# Patient Record
Sex: Male | Born: 1935
Health system: Southern US, Community
[De-identification: ages and names within clinical notes are randomized; demographics above are authoritative.]

## PROBLEM LIST (undated history)

## (undated) DIAGNOSIS — I739 Peripheral vascular disease, unspecified: Secondary | ICD-10-CM

## (undated) DIAGNOSIS — I2510 Atherosclerotic heart disease of native coronary artery without angina pectoris: Secondary | ICD-10-CM

## (undated) DIAGNOSIS — N183 Chronic kidney disease, stage 3 unspecified: Secondary | ICD-10-CM

## (undated) DIAGNOSIS — K219 Gastro-esophageal reflux disease without esophagitis: Secondary | ICD-10-CM

## (undated) DIAGNOSIS — J90 Pleural effusion, not elsewhere classified: Secondary | ICD-10-CM

## (undated) DIAGNOSIS — N490 Inflammatory disorders of seminal vesicle: Secondary | ICD-10-CM

## (undated) DIAGNOSIS — E785 Hyperlipidemia, unspecified: Secondary | ICD-10-CM

## (undated) DIAGNOSIS — M7989 Other specified soft tissue disorders: Secondary | ICD-10-CM

## (undated) DIAGNOSIS — I2699 Other pulmonary embolism without acute cor pulmonale: Secondary | ICD-10-CM

## (undated) DIAGNOSIS — Z7901 Long term (current) use of anticoagulants: Secondary | ICD-10-CM

## (undated) DIAGNOSIS — J449 Chronic obstructive pulmonary disease, unspecified: Secondary | ICD-10-CM

## (undated) DIAGNOSIS — I10 Essential (primary) hypertension: Secondary | ICD-10-CM

## (undated) DIAGNOSIS — R001 Bradycardia, unspecified: Secondary | ICD-10-CM

## (undated) HISTORY — DX: Bradycardia, unspecified: R00.1

## (undated) HISTORY — DX: Other pulmonary embolism without acute cor pulmonale: I26.99

## (undated) HISTORY — DX: Pleural effusion, not elsewhere classified: J90

## (undated) HISTORY — DX: Inflammatory disorders of seminal vesicle: N49.0

## (undated) HISTORY — DX: Gastro-esophageal reflux disease without esophagitis: K21.9

## (undated) HISTORY — DX: Peripheral vascular disease, unspecified: I73.9

## (undated) HISTORY — DX: Atherosclerotic heart disease of native coronary artery without angina pectoris: I25.10

## (undated) HISTORY — DX: Chronic kidney disease, stage 3 unspecified: N18.30

## (undated) HISTORY — DX: Chronic obstructive pulmonary disease, unspecified: J44.9

## (undated) HISTORY — DX: Hyperlipidemia, unspecified: E78.5

## (undated) HISTORY — DX: Chronic kidney disease, stage 3 (moderate): N18.3

## (undated) HISTORY — DX: Long term (current) use of anticoagulants: Z79.01

---

## 2001-05-13 ENCOUNTER — Inpatient Hospital Stay (HOSPITAL_COMMUNITY): Admission: RE | Admit: 2001-05-13 | Discharge: 2001-05-14 | Payer: Self-pay | Admitting: Cardiology

## 2001-06-01 ENCOUNTER — Encounter (HOSPITAL_COMMUNITY): Admission: RE | Admit: 2001-06-01 | Discharge: 2001-07-01 | Payer: Self-pay | Admitting: Cardiology

## 2001-07-03 ENCOUNTER — Encounter (HOSPITAL_COMMUNITY): Admission: RE | Admit: 2001-07-03 | Discharge: 2001-08-02 | Payer: Self-pay | Admitting: Cardiology

## 2001-08-05 ENCOUNTER — Encounter (HOSPITAL_COMMUNITY): Admission: RE | Admit: 2001-08-05 | Discharge: 2001-09-04 | Payer: Self-pay | Admitting: Cardiology

## 2001-08-14 ENCOUNTER — Inpatient Hospital Stay (HOSPITAL_COMMUNITY): Admission: AD | Admit: 2001-08-14 | Discharge: 2001-08-18 | Payer: Self-pay | Admitting: Cardiology

## 2001-09-14 ENCOUNTER — Encounter (HOSPITAL_COMMUNITY): Admission: RE | Admit: 2001-09-14 | Discharge: 2001-10-14 | Payer: Self-pay | Admitting: Cardiology

## 2001-09-25 ENCOUNTER — Encounter: Payer: Self-pay | Admitting: Cardiology

## 2001-09-25 ENCOUNTER — Ambulatory Visit (HOSPITAL_COMMUNITY): Admission: RE | Admit: 2001-09-25 | Discharge: 2001-09-25 | Payer: Self-pay | Admitting: Cardiology

## 2001-10-04 HISTORY — PX: CORONARY ARTERY BYPASS GRAFT: SHX141

## 2001-10-15 ENCOUNTER — Encounter: Payer: Self-pay | Admitting: Surgery

## 2001-10-15 ENCOUNTER — Inpatient Hospital Stay (HOSPITAL_COMMUNITY): Admission: RE | Admit: 2001-10-15 | Discharge: 2001-10-20 | Payer: Self-pay | Admitting: Cardiology

## 2001-10-16 ENCOUNTER — Encounter: Payer: Self-pay | Admitting: Surgery

## 2001-10-17 ENCOUNTER — Encounter: Payer: Self-pay | Admitting: Surgery

## 2001-10-17 ENCOUNTER — Encounter: Payer: Self-pay | Admitting: Cardiothoracic Surgery

## 2001-10-18 ENCOUNTER — Encounter: Payer: Self-pay | Admitting: Cardiothoracic Surgery

## 2001-10-19 ENCOUNTER — Encounter: Payer: Self-pay | Admitting: Cardiothoracic Surgery

## 2001-10-20 ENCOUNTER — Encounter: Payer: Self-pay | Admitting: Cardiothoracic Surgery

## 2001-11-06 ENCOUNTER — Ambulatory Visit (HOSPITAL_COMMUNITY): Admission: RE | Admit: 2001-11-06 | Discharge: 2001-11-06 | Payer: Self-pay | Admitting: Cardiology

## 2001-11-25 ENCOUNTER — Encounter (HOSPITAL_COMMUNITY): Admission: RE | Admit: 2001-11-25 | Discharge: 2001-12-28 | Payer: Self-pay | Admitting: Cardiology

## 2001-12-30 ENCOUNTER — Encounter (HOSPITAL_COMMUNITY): Admission: RE | Admit: 2001-12-30 | Discharge: 2002-01-29 | Payer: Self-pay | Admitting: Cardiology

## 2002-02-01 ENCOUNTER — Encounter (HOSPITAL_COMMUNITY): Admission: RE | Admit: 2002-02-01 | Discharge: 2002-03-03 | Payer: Self-pay | Admitting: Cardiology

## 2002-03-04 ENCOUNTER — Encounter (HOSPITAL_COMMUNITY): Admission: RE | Admit: 2002-03-04 | Discharge: 2002-04-03 | Payer: Self-pay | Admitting: Cardiology

## 2002-03-08 ENCOUNTER — Encounter (HOSPITAL_COMMUNITY): Admission: RE | Admit: 2002-03-08 | Discharge: 2002-04-07 | Payer: Self-pay | Admitting: Cardiology

## 2004-12-24 ENCOUNTER — Ambulatory Visit (HOSPITAL_COMMUNITY): Admission: RE | Admit: 2004-12-24 | Discharge: 2004-12-24 | Payer: Self-pay | Admitting: Urology

## 2005-02-06 ENCOUNTER — Ambulatory Visit: Payer: Self-pay | Admitting: Cardiology

## 2005-06-06 ENCOUNTER — Ambulatory Visit (HOSPITAL_COMMUNITY): Admission: RE | Admit: 2005-06-06 | Discharge: 2005-06-06 | Payer: Self-pay | Admitting: Urology

## 2005-07-05 HISTORY — PX: TRANSURETHRAL RESECTION OF PROSTATE: SHX73

## 2005-07-24 ENCOUNTER — Ambulatory Visit (HOSPITAL_COMMUNITY): Admission: RE | Admit: 2005-07-24 | Discharge: 2005-07-24 | Payer: Self-pay | Admitting: Urology

## 2005-08-19 ENCOUNTER — Ambulatory Visit (HOSPITAL_COMMUNITY): Admission: RE | Admit: 2005-08-19 | Discharge: 2005-08-19 | Payer: Self-pay | Admitting: Urology

## 2005-10-22 ENCOUNTER — Ambulatory Visit: Payer: Self-pay | Admitting: Cardiology

## 2005-11-04 DIAGNOSIS — I2699 Other pulmonary embolism without acute cor pulmonale: Secondary | ICD-10-CM

## 2005-11-04 HISTORY — DX: Other pulmonary embolism without acute cor pulmonale: I26.99

## 2005-12-05 ENCOUNTER — Ambulatory Visit: Payer: Self-pay | Admitting: Cardiology

## 2006-02-17 ENCOUNTER — Ambulatory Visit (HOSPITAL_COMMUNITY): Admission: RE | Admit: 2006-02-17 | Discharge: 2006-02-17 | Payer: Self-pay | Admitting: Cardiology

## 2006-02-17 ENCOUNTER — Ambulatory Visit: Payer: Self-pay | Admitting: Cardiology

## 2006-02-18 ENCOUNTER — Ambulatory Visit (HOSPITAL_COMMUNITY): Admission: RE | Admit: 2006-02-18 | Discharge: 2006-02-18 | Payer: Self-pay | Admitting: Cardiology

## 2006-02-18 ENCOUNTER — Ambulatory Visit: Payer: Self-pay | Admitting: *Deleted

## 2006-02-21 ENCOUNTER — Ambulatory Visit: Payer: Self-pay | Admitting: Cardiology

## 2006-02-24 ENCOUNTER — Encounter (HOSPITAL_COMMUNITY): Admission: RE | Admit: 2006-02-24 | Discharge: 2006-03-26 | Payer: Self-pay | Admitting: Cardiology

## 2006-02-24 ENCOUNTER — Ambulatory Visit: Payer: Self-pay | Admitting: Cardiology

## 2006-02-24 ENCOUNTER — Inpatient Hospital Stay (HOSPITAL_COMMUNITY): Admission: AD | Admit: 2006-02-24 | Discharge: 2006-03-02 | Payer: Self-pay | Admitting: Cardiology

## 2006-02-25 ENCOUNTER — Encounter (INDEPENDENT_AMBULATORY_CARE_PROVIDER_SITE_OTHER): Payer: Self-pay | Admitting: *Deleted

## 2006-03-04 ENCOUNTER — Ambulatory Visit: Payer: Self-pay | Admitting: *Deleted

## 2006-03-07 ENCOUNTER — Ambulatory Visit: Payer: Self-pay | Admitting: *Deleted

## 2006-03-13 ENCOUNTER — Ambulatory Visit: Payer: Self-pay | Admitting: *Deleted

## 2006-03-18 ENCOUNTER — Ambulatory Visit: Payer: Self-pay | Admitting: *Deleted

## 2006-04-02 ENCOUNTER — Ambulatory Visit: Payer: Self-pay | Admitting: *Deleted

## 2006-05-05 ENCOUNTER — Ambulatory Visit: Payer: Self-pay | Admitting: *Deleted

## 2006-05-19 ENCOUNTER — Ambulatory Visit: Payer: Self-pay | Admitting: Cardiology

## 2006-05-26 ENCOUNTER — Ambulatory Visit: Payer: Self-pay | Admitting: Cardiology

## 2006-06-10 ENCOUNTER — Ambulatory Visit: Payer: Self-pay | Admitting: *Deleted

## 2006-07-16 ENCOUNTER — Ambulatory Visit: Payer: Self-pay | Admitting: Cardiology

## 2006-08-13 ENCOUNTER — Ambulatory Visit: Payer: Self-pay | Admitting: Cardiology

## 2006-09-12 ENCOUNTER — Ambulatory Visit: Payer: Self-pay | Admitting: Internal Medicine

## 2006-10-16 ENCOUNTER — Ambulatory Visit: Payer: Self-pay | Admitting: Internal Medicine

## 2006-11-06 ENCOUNTER — Ambulatory Visit: Payer: Self-pay | Admitting: Cardiology

## 2006-11-27 ENCOUNTER — Ambulatory Visit: Payer: Self-pay | Admitting: Cardiology

## 2006-12-26 ENCOUNTER — Ambulatory Visit: Payer: Self-pay | Admitting: Cardiology

## 2007-01-27 ENCOUNTER — Ambulatory Visit: Payer: Self-pay | Admitting: *Deleted

## 2007-02-09 ENCOUNTER — Ambulatory Visit: Payer: Self-pay | Admitting: Internal Medicine

## 2007-02-23 ENCOUNTER — Ambulatory Visit: Payer: Self-pay | Admitting: Cardiology

## 2007-02-24 ENCOUNTER — Encounter (HOSPITAL_COMMUNITY): Admission: RE | Admit: 2007-02-24 | Discharge: 2007-03-26 | Payer: Self-pay | Admitting: Orthopaedic Surgery

## 2007-03-25 ENCOUNTER — Ambulatory Visit: Payer: Self-pay | Admitting: Cardiology

## 2007-03-27 ENCOUNTER — Ambulatory Visit: Payer: Self-pay | Admitting: Internal Medicine

## 2007-03-27 ENCOUNTER — Ambulatory Visit (HOSPITAL_COMMUNITY): Admission: RE | Admit: 2007-03-27 | Discharge: 2007-03-27 | Payer: Self-pay | Admitting: Cardiology

## 2007-04-28 ENCOUNTER — Ambulatory Visit: Payer: Self-pay | Admitting: Cardiology

## 2007-05-29 ENCOUNTER — Ambulatory Visit: Payer: Self-pay | Admitting: Cardiology

## 2007-06-05 ENCOUNTER — Ambulatory Visit: Payer: Self-pay | Admitting: Cardiology

## 2007-07-07 ENCOUNTER — Ambulatory Visit: Payer: Self-pay | Admitting: Cardiovascular Disease

## 2007-08-07 ENCOUNTER — Ambulatory Visit: Payer: Self-pay | Admitting: Cardiology

## 2007-09-09 ENCOUNTER — Ambulatory Visit: Payer: Self-pay | Admitting: Cardiology

## 2007-10-07 ENCOUNTER — Ambulatory Visit: Payer: Self-pay | Admitting: Cardiology

## 2007-11-11 ENCOUNTER — Ambulatory Visit: Payer: Self-pay | Admitting: Cardiology

## 2007-11-25 ENCOUNTER — Ambulatory Visit: Payer: Self-pay | Admitting: Cardiology

## 2007-12-23 ENCOUNTER — Ambulatory Visit: Payer: Self-pay | Admitting: Cardiology

## 2008-01-06 ENCOUNTER — Ambulatory Visit: Payer: Self-pay | Admitting: Cardiology

## 2008-01-29 ENCOUNTER — Ambulatory Visit: Payer: Self-pay | Admitting: Cardiology

## 2008-02-26 ENCOUNTER — Ambulatory Visit: Payer: Self-pay | Admitting: Internal Medicine

## 2008-03-25 ENCOUNTER — Ambulatory Visit: Payer: Self-pay | Admitting: Cardiovascular Disease

## 2008-04-22 ENCOUNTER — Ambulatory Visit: Payer: Self-pay | Admitting: Internal Medicine

## 2008-05-20 ENCOUNTER — Ambulatory Visit: Payer: Self-pay | Admitting: Internal Medicine

## 2008-06-16 ENCOUNTER — Ambulatory Visit: Payer: Self-pay | Admitting: Cardiology

## 2008-06-21 ENCOUNTER — Ambulatory Visit (HOSPITAL_COMMUNITY): Admission: RE | Admit: 2008-06-21 | Discharge: 2008-06-21 | Payer: Self-pay | Admitting: Cardiology

## 2008-06-24 ENCOUNTER — Ambulatory Visit: Payer: Self-pay | Admitting: Cardiology

## 2008-07-07 ENCOUNTER — Ambulatory Visit: Payer: Self-pay | Admitting: Cardiology

## 2008-07-14 ENCOUNTER — Ambulatory Visit: Payer: Self-pay | Admitting: Cardiology

## 2008-08-08 ENCOUNTER — Ambulatory Visit (HOSPITAL_COMMUNITY): Admission: RE | Admit: 2008-08-08 | Discharge: 2008-08-08 | Payer: Self-pay | Admitting: Urology

## 2008-08-15 ENCOUNTER — Ambulatory Visit: Payer: Self-pay | Admitting: Cardiology

## 2008-09-22 ENCOUNTER — Ambulatory Visit: Payer: Self-pay | Admitting: Cardiology

## 2008-10-13 ENCOUNTER — Ambulatory Visit: Payer: Self-pay | Admitting: Cardiology

## 2008-11-10 ENCOUNTER — Ambulatory Visit: Payer: Self-pay | Admitting: Cardiology

## 2008-12-08 ENCOUNTER — Ambulatory Visit: Payer: Self-pay | Admitting: Cardiology

## 2009-01-02 ENCOUNTER — Ambulatory Visit: Payer: Self-pay | Admitting: Cardiology

## 2009-02-02 ENCOUNTER — Ambulatory Visit: Payer: Self-pay | Admitting: Cardiology

## 2009-02-09 ENCOUNTER — Ambulatory Visit: Payer: Self-pay | Admitting: Cardiology

## 2009-02-20 ENCOUNTER — Ambulatory Visit: Payer: Self-pay | Admitting: Cardiology

## 2009-03-08 ENCOUNTER — Encounter (INDEPENDENT_AMBULATORY_CARE_PROVIDER_SITE_OTHER): Payer: Self-pay | Admitting: *Deleted

## 2009-03-21 ENCOUNTER — Ambulatory Visit: Payer: Self-pay | Admitting: Cardiology

## 2009-04-13 ENCOUNTER — Ambulatory Visit: Payer: Self-pay | Admitting: Cardiology

## 2009-05-04 ENCOUNTER — Ambulatory Visit: Payer: Self-pay | Admitting: Cardiology

## 2009-06-01 ENCOUNTER — Ambulatory Visit: Payer: Self-pay | Admitting: Cardiology

## 2009-06-19 ENCOUNTER — Encounter: Payer: Self-pay | Admitting: *Deleted

## 2009-07-06 ENCOUNTER — Ambulatory Visit: Payer: Self-pay | Admitting: Cardiology

## 2009-07-06 LAB — CONVERTED CEMR LAB: POC INR: 2.9

## 2009-08-09 ENCOUNTER — Ambulatory Visit: Payer: Self-pay | Admitting: Cardiology

## 2009-09-08 ENCOUNTER — Ambulatory Visit: Payer: Self-pay | Admitting: Cardiology

## 2009-09-08 LAB — CONVERTED CEMR LAB: POC INR: 2.4

## 2009-10-09 ENCOUNTER — Ambulatory Visit: Payer: Self-pay | Admitting: Cardiovascular Disease

## 2009-11-06 ENCOUNTER — Ambulatory Visit: Payer: Self-pay | Admitting: Cardiology

## 2009-11-27 ENCOUNTER — Ambulatory Visit: Payer: Self-pay | Admitting: Cardiology

## 2009-12-06 ENCOUNTER — Encounter (HOSPITAL_COMMUNITY): Admission: RE | Admit: 2009-12-06 | Discharge: 2010-01-05 | Payer: Self-pay | Admitting: Orthopaedic Surgery

## 2009-12-25 ENCOUNTER — Ambulatory Visit: Payer: Self-pay | Admitting: Cardiology

## 2009-12-25 LAB — CONVERTED CEMR LAB: POC INR: 1.8

## 2010-01-08 ENCOUNTER — Ambulatory Visit: Payer: Self-pay | Admitting: Cardiology

## 2010-01-08 LAB — CONVERTED CEMR LAB: POC INR: 2

## 2010-02-05 ENCOUNTER — Ambulatory Visit: Payer: Self-pay | Admitting: Cardiology

## 2010-03-05 ENCOUNTER — Ambulatory Visit: Payer: Self-pay | Admitting: Cardiology

## 2010-03-05 LAB — CONVERTED CEMR LAB: POC INR: 3.7

## 2010-03-08 ENCOUNTER — Ambulatory Visit: Payer: Self-pay | Admitting: Cardiology

## 2010-03-08 ENCOUNTER — Encounter: Payer: Self-pay | Admitting: *Deleted

## 2010-03-08 DIAGNOSIS — N490 Inflammatory disorders of seminal vesicle: Secondary | ICD-10-CM | POA: Insufficient documentation

## 2010-03-08 DIAGNOSIS — J90 Pleural effusion, not elsewhere classified: Secondary | ICD-10-CM | POA: Insufficient documentation

## 2010-03-08 DIAGNOSIS — I25810 Atherosclerosis of coronary artery bypass graft(s) without angina pectoris: Secondary | ICD-10-CM | POA: Insufficient documentation

## 2010-03-08 DIAGNOSIS — I495 Sick sinus syndrome: Secondary | ICD-10-CM | POA: Insufficient documentation

## 2010-03-09 ENCOUNTER — Encounter: Payer: Self-pay | Admitting: Adult Health

## 2010-03-28 ENCOUNTER — Ambulatory Visit: Payer: Self-pay | Admitting: Cardiology

## 2010-03-28 LAB — CONVERTED CEMR LAB: POC INR: 2.8

## 2010-04-09 ENCOUNTER — Ambulatory Visit: Payer: Self-pay | Admitting: Cardiovascular Disease

## 2010-04-30 ENCOUNTER — Ambulatory Visit: Payer: Self-pay | Admitting: Cardiology

## 2010-04-30 LAB — CONVERTED CEMR LAB: POC INR: 2.8

## 2010-05-03 ENCOUNTER — Ambulatory Visit: Payer: Self-pay

## 2010-05-03 ENCOUNTER — Ambulatory Visit: Payer: Self-pay | Admitting: Cardiovascular Disease

## 2010-05-28 ENCOUNTER — Ambulatory Visit: Payer: Self-pay | Admitting: Cardiology

## 2010-05-28 LAB — CONVERTED CEMR LAB: POC INR: 2.8

## 2010-05-29 ENCOUNTER — Telehealth: Payer: Self-pay | Admitting: Cardiovascular Disease

## 2010-06-25 ENCOUNTER — Ambulatory Visit: Payer: Self-pay | Admitting: Cardiology

## 2010-06-25 LAB — CONVERTED CEMR LAB: POC INR: 3.1

## 2010-07-23 ENCOUNTER — Ambulatory Visit: Payer: Self-pay | Admitting: Cardiology

## 2010-08-20 ENCOUNTER — Ambulatory Visit: Payer: Self-pay | Admitting: Cardiology

## 2010-09-17 ENCOUNTER — Ambulatory Visit: Payer: Self-pay | Admitting: Cardiology

## 2010-09-17 LAB — CONVERTED CEMR LAB: POC INR: 2.3

## 2010-10-15 ENCOUNTER — Ambulatory Visit: Payer: Self-pay | Admitting: Cardiology

## 2010-11-14 ENCOUNTER — Ambulatory Visit: Admission: RE | Admit: 2010-11-14 | Discharge: 2010-11-14 | Payer: Self-pay | Source: Home / Self Care

## 2010-12-04 NOTE — Medication Information (Signed)
Summary: ccr-lr  Anticoagulant Therapy  Managed by: Vashti Hey, RN Supervising MD: Dietrich Pates MD, Molly Maduro Indication 1: Pulmonary Embolism and Infarction (ICD-415.1) Lab Used: [facility] [place] Site: [place] INR POC 2.2  Dietary changes: no    Health status changes: no    Bleeding/hemorrhagic complications: no    Recent/future hospitalizations: no    Any changes in medication regimen? no    Recent/future dental: no  Any missed doses?: no       Is patient compliant with meds? yes       Allergies: No Known Drug Allergies  Anticoagulation Management History:      The patient is taking warfarin and comes in today for a routine follow up visit.  Positive risk factors for bleeding include an age of 75 years or older.  The bleeding index is 'intermediate risk'.  Positive CHADS2 values include Age > 75 years old.  The start date was 02/21/2006.  Anticoagulation responsible provider: Dietrich Pates MD, Molly Maduro.  INR POC: 2.2.  Cuvette Lot#: [id number].  Exp: 10/11.    Anticoagulation Management Assessment/Plan:      The patient's current anticoagulation dose is Warfarin sodium 2 mg tabs: Use as directed by Anticoagualtion Clinic, Coumadin 2 mg tabs: Sunday - 2 tabs, Monday - 3 tabs, Tuesday - 2 tabs, Wednesday - 2 tabs, Thursday - 2 tabs, Friday - 2 tabs, Saturday - 2 tabs.  The target INR is 2 - 3.  The next INR is due 12/25/2009.  Anticoagulation instructions were given to patient.  Results were reviewed/authorized by Vashti Hey, RN.  He was notified by Vashti Hey RN.         Prior Anticoagulation Instructions: INR 1.7 Take coumadin extra 1 1/2 tablets today then resume 2 tablets once daily except 3 tablets on Mondays  Current Anticoagulation Instructions: INR 2.2 Continue coumadin 4mg  once daily except 6mg  on Mondays

## 2010-12-04 NOTE — Assessment & Plan Note (Signed)
Summary: per check out/pt is havei aterial also/saf    Visit Type:  3 wk f/u Primary Provider:  Lucianne Lei  CC:  edema/ankles....denies any cp or sob.  History of Present Illness: 77 yo AAM with history of CAD s/p CABG, HTN, HL and prior PVD here today for PV follow up. He was seen as a new patient three weeks ago. He told me that both of his legs have been swollen. He feels like there is a boot wrapped around both lower legs. No pain with walking. The pressure sensation in the legs happens at rest and with exertion. No ulcerations. He has a prior workup with non-invasive studies in 2009 with reduced ABI bilaterally per records and diffuse disease in both legs. He has had issues with swelling in both legs since his vein harvest for CABG. No chest pain or SOB. I ordered non-invasive studies which showed normal ABI bilaterally with tibial and small vessel disease bilaterally. Probable medial calcification in both legs. The waveforms in his bilateral CFA and popliteal arteries are normal.   He has had no change in his clinical status. No claudication. No rest pain. No ulcerations over lower ext. Chronic edema bilateral ankles.    Current Medications (verified): 1)  Warfarin Sodium 2 Mg Tabs (Warfarin Sodium) .... Use As Directed By Anticoagualtion Clinic 2)  Coumadin 2 Mg Tabs (Warfarin Sodium) .... Sunday - 2 Tabs, Monday - 3 Tabs, Tuesday - 2 Tabs, Wednesday - 3 Tabs, Thursday - 2 Tabs, Friday - 3 Tabs, Saturday - 2 Tabs 3)  Atacand 16 Mg Tabs (Candesartan Cilexetil) .... Take 1 Tab Daily 4)  Simvastatin 40 Mg Tabs (Simvastatin) .... Take 1 Tab Daily 5)  Atenolol 25 Mg Tabs (Atenolol) .... Take 1 Tab Daily 6)  Flomax 0.4 Mg Caps (Tamsulosin Hcl) .... Take 1 Tab At Night 7)  Allegra 180 Mg Tabs (Fexofenadine Hcl) .... Take 1 Tab Daily 8)  Nitrolingual 0.4 Mg/spray Soln (Nitroglycerin) .... Use As Needed For Chestpain 9)  Aspirin 81 Mg Tbec (Aspirin) .... Take One Daily 10)  Tramadol Hcl 50 Mg  Tabs (Tramadol Hcl) .... Take One Every 4 To 6 Hours As Needed 11)  Tamsulosin Hcl 0.4 Mg Caps (Tamsulosin Hcl) .... Take One At Bedtime  Allergies: 1)  ! Erythromycin  Review of Systems       The patient complains of leg swelling.  The patient denies fatigue, malaise, fever, weight gain/loss, vision loss, decreased hearing, hoarseness, chest pain, palpitations, shortness of breath, prolonged cough, wheezing, sleep apnea, coughing up blood, abdominal pain, blood in stool, nausea, vomiting, diarrhea, heartburn, incontinence, blood in urine, muscle weakness, joint pain, rash, skin lesions, headache, fainting, dizziness, depression, anxiety, enlarged lymph nodes, easy bruising or bleeding, and environmental allergies.    Vital Signs:  Patient profile:   77 year old male Height:      71 inches Weight:      194 pounds BMI:     27 .16 Pulse rate:   78 / minute Pulse rhythm:   regular BP sitting:   138 / 80  (left arm) Cuff size:   large  Vitals Entered By: Danielle Rankin, CMA (May 03, 2010 12:16 PM)  Physical Exam  General:  General: Well developed, well nourished, NAD Neuro: No focal deficits Musculoskeletal: Muscle strength 5/5 all ext Neck: No JVD, no carotid bruits, no thyromegaly, no lymphadenopathy. Lungs:Clear bilaterally, no wheezes, rhonci, crackles CV: RRR no murmurs, gallops rubs Abdomen: soft, NT, ND, BS present Extremities: Trace bilateral  edema, pulses difficult to palpate DP/PT   Arterial Doppler  Procedure date:  05/03/2010  Findings:      Right ABI 1.1, Left ABI 0.96. (Probable medial calcification) Normal CFA and popliteal waveforms Tibial and small vessel disease bilaterally TBI 0.45 right and 0.54 left. Adequate for tissue healing  Impression & Recommendations:  Problem # 1:  PVD (ICD-443.9) No claudication or rest pain. Probable disease but not severe. Repeat ABI in one year. I will see him at that time. Continue ASA.   Patient Instructions: 1)  Your  physician recommends that you schedule a follow-up appointment in: 12 months 2)  Your physician has requested that you have an ankle brachial index (ABI). During this test an ultrasound and blood pressure cuff are used to evaluate the arteries that supply the arms and legs with blood. Allow thirty minutes for this exam. There are no restrictions or special instructions. To be done in 12 months

## 2010-12-04 NOTE — Medication Information (Signed)
Summary: ccr-lr  Anticoagulant Therapy  Managed by: Vashti Hey, RN PCP: Marguerita Merles MD: Dietrich Pates MD, Molly Maduro Indication 1: Pulmonary Embolism and Infarction (ICD-415.1) Lab Used: [facility] [place] Site: [place] INR POC 2.8  Dietary changes: no    Health status changes: no    Bleeding/hemorrhagic complications: no    Recent/future hospitalizations: no    Any changes in medication regimen? no    Recent/future dental: no  Any missed doses?: no       Is patient compliant with meds? yes       Allergies: 1)  ! Erythromycin  Anticoagulation Management History:      The patient is taking warfarin and comes in today for a routine follow up visit.  Positive risk factors for bleeding include an age of 75 years or older.  The bleeding index is 'intermediate risk'.  Positive CHADS2 values include History of HTN and Age > 21 years old.  The start date was 02/21/2006.  Anticoagulation responsible provider: Dietrich Pates MD, Molly Maduro.  INR POC: 2.8.  Cuvette Lot#: [id number].  Exp: 10/11.    Anticoagulation Management Assessment/Plan:      The patient's current anticoagulation dose is Warfarin sodium 2 mg tabs: Use as directed by Anticoagualtion Clinic, Coumadin 2 mg tabs: Sunday - 2 tabs, Monday - 3 tabs, Tuesday - 2 tabs, Wednesday - 3 tabs, Thursday - 2 tabs, Friday - 3 tabs, Saturday - 2 tabs.  The target INR is 2 - 3.  The next INR is due 05/28/2010.  Anticoagulation instructions were given to patient.  Results were reviewed/authorized by Vashti Hey, RN.  He was notified by Vashti Hey RN.         Prior Anticoagulation Instructions: INR 2.8 Continue coumadin 4mg  once daily except 6mg  on Mondays, Wednesdays and Fridays  Current Anticoagulation Instructions: Same as Prior Instructions.

## 2010-12-04 NOTE — Assessment & Plan Note (Signed)
Summary: np6/pad/jss  Medications Added ASPIRIN 81 MG TBEC (ASPIRIN) take one daily TRAMADOL HCL 50 MG TABS (TRAMADOL HCL) take one every 4 to 6 hours as needed TAMSULOSIN HCL 0.4 MG CAPS (TAMSULOSIN HCL) take one at bedtime      Allergies Added: ! ERYTHROMYCIN  Visit Type:  Initial Consult Primary Provider:  mckay,james  CC:  edema in both ankles.  History of Present Illness: 77 yo AAM with history of CAD s/p CABG, HTN, HL and prior PVD referred today for new patient evaluation for PV assessment. He tells me that both of his legs have been swollen. He feels like there is a boot wrapped around both lower legs. No pain with walking. The pressure sensation in the legs happens at rest and with exertion. No ulcerations. He has a prior workup with non-invasive studies in 2009 with reduced ABI bilaterally per records and diffuse disease in both legs. He has had issues with swelling in both legs since his vein harvest for CABG. No chest pain or SOB.   Current Medications (verified): 1)  Warfarin Sodium 2 Mg Tabs (Warfarin Sodium) .... Use As Directed By Anticoagualtion Clinic 2)  Coumadin 2 Mg Tabs (Warfarin Sodium) .... Sunday - 2 Tabs, Monday - 3 Tabs, Tuesday - 2 Tabs, Wednesday - 3 Tabs, Thursday - 2 Tabs, Friday - 3 Tabs, Saturday - 2 Tabs 3)  Atacand 16 Mg Tabs (Candesartan Cilexetil) .... Take 1 Tab Daily 4)  Simvastatin 40 Mg Tabs (Simvastatin) .... Take 1 Tab Daily 5)  Atenolol 25 Mg Tabs (Atenolol) .... Take 1 Tab Daily 6)  Flomax 0.4 Mg Caps (Tamsulosin Hcl) .... Take 1 Tab At Night 7)  Allegra 180 Mg Tabs (Fexofenadine Hcl) .... Take 1 Tab Daily 8)  Nitrolingual 0.4 Mg/spray Soln (Nitroglycerin) .... Use As Needed For Chestpain 9)  Aspirin 81 Mg Tbec (Aspirin) .... Take One Daily 10)  Tramadol Hcl 50 Mg Tabs (Tramadol Hcl) .... Take One Every 4 To 6 Hours As Needed 11)  Tamsulosin Hcl 0.4 Mg Caps (Tamsulosin Hcl) .... Take One At Bedtime  Allergies (verified): 1)  !  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 coronary artery bypass graft 10/2001 turp 07/2005  Family History: Father:deceased age 54 due to natural causes Mother:deceased age 18 due to natural causes Siblings:1 brother deceased 1 sister alive and well  Social History: Retired - [employer] Married 2 children, 2 grandchildren Tobacco Use - Former, none since 1989. (10 pack year history) Alcohol Use -Yes, 1 can beer/day.  Regular Exercise - no Drug Use - no  Review of Systems       The patient complains of joint pain and leg swelling.  The patient denies fatigue, malaise, fever, weight gain/loss, vision loss, decreased hearing, hoarseness, chest pain, palpitations, shortness of breath, prolonged cough, wheezing, sleep apnea, coughing up blood, abdominal pain, blood in stool, nausea, vomiting, diarrhea, heartburn, incontinence, blood in urine, muscle weakness, rash, skin lesions, headache, fainting, dizziness, depression, anxiety, enlarged lymph nodes, easy bruising or bleeding, and environmental allergies.         See HPI, Pressure feeling in both legs.  Vital Signs:  Patient profile:   77 year old male Height:      71 inches Weight:      196 pounds Pulse rate:   84 / minute Pulse rhythm:   regular BP sitting:   168 / 72  (left arm)  Vitals Entered By: Jacquelin Hawking, CMA (April 09, 2010 11:39 AM)  Physical Exam  General:  General: Well  developed, well nourished, NAD HEENT: OP clear, mucus membranes moist SKIN: warm, dry Neuro: No focal deficits Musculoskeletal: Muscle strength 5/5 all ext Psychiatric: Mood and affect normal Neck: No JVD, no carotid bruits, no thyromegaly, no lymphadenopathy. Lungs:Clear bilaterally, no wheezes, rhonci, crackles CV: RRR no murmurs, gallops rubs Abdomen: soft, NT, ND, BS present Extremities: Trace bilateral  edema, pulses difficult to palpate DP/PT-hand held doppler with dopplerable right DP/PT, left DP. Could not doppler left PT.     Impression & Recommendations:  Problem # 1:  PERIPHERAL CIRCULATORY DISORDER (ICD-V12.59) His symptoms could be related to arterial occlusive disease in the lower extremities although he does not have classic claudication. Will arrange full arterial dopplers with ABI to assess. He is advised to remain active and continue walking. He is on ASA and coumadin for h/o PE.  Will see him after his dopplers and make further recommendations.   His updated medication list for this problem includes:    Warfarin Sodium 2 Mg Tabs (Warfarin sodium) ..... Use as directed by anticoagualtion clinic    Coumadin 2 Mg Tabs (Warfarin sodium) ..... Sunday - 2 tabs, monday - 3 tabs, tuesday - 2 tabs, wednesday - 3 tabs, thursday - 2 tabs, friday - 3 tabs, saturday - 2 tabs    Atenolol 25 Mg Tabs (Atenolol) .Marland Kitchen... Take 1 tab daily    Nitrolingual 0.4 Mg/spray Soln (Nitroglycerin) ..... Use as needed for chestpain    Aspirin 81 Mg Tbec (Aspirin) .Marland Kitchen... Take one daily  Other Orders: Arterial Duplex Lower Extremity (Arterial Duplex Low)  Patient Instructions: 1)  Your physician recommends that you schedule a follow-up appointment in: 3 weeks on same day as doppler studies 2)  Your physician has requested that you have a lower or upper extremity arterial duplex.  This test is an ultrasound of the arteries in the legs or arms.  It looks at arterial blood flow in the legs and arms.   Allow one hour for Lower and Upper Arterial scans. There are no restrictions or special instructions.

## 2010-12-04 NOTE — Medication Information (Signed)
Summary: ccr-lr  Anticoagulant Therapy  Managed by: Vashti Hey, RN Supervising MD: Dietrich Pates MD, Molly Maduro Indication 1: Pulmonary Embolism and Infarction (ICD-415.1) Lab Used: [facility] [place] Site: [place] INR POC 2.5  Dietary changes: no    Health status changes: no    Bleeding/hemorrhagic complications: no    Recent/future hospitalizations: no    Any changes in medication regimen? no    Recent/future dental: no  Any missed doses?: no       Is patient compliant with meds? yes       Allergies: No Known Drug Allergies  Anticoagulation Management History:      The patient is taking warfarin and comes in today for a routine follow up visit.  Positive risk factors for bleeding include an age of 75 years or older.  The bleeding index is 'intermediate risk'.  Positive CHADS2 values include Age > 75 years old.  The start date was 02/21/2006.  Anticoagulation responsible provider: Dietrich Pates MD, Molly Maduro.  INR POC: 2.5.  Cuvette Lot#: [id number].  Exp: 10/11.    Anticoagulation Management Assessment/Plan:      The patient's current anticoagulation dose is Warfarin sodium 2 mg tabs: Use as directed by Anticoagualtion Clinic, Coumadin 2 mg tabs: Sunday - 2 tabs, Monday - 3 tabs, Tuesday - 2 tabs, Wednesday - 3 tabs, Thursday - 2 tabs, Friday - 3 tabs, Saturday - 2 tabs.  The target INR is 2 - 3.  The next INR is due 03/05/2010.  Anticoagulation instructions were given to patient.  Results were reviewed/authorized by Vashti Hey, RN.  He was notified by Vashti Hey RN.         Prior Anticoagulation Instructions: INR 2.0 Increase coumadin to 4mg  once daily except 6mg  on Mondays, Wednesdays and Fridays  Current Anticoagulation Instructions: INR 2.5 Continue coumadin 4mg  once daily except 6mg  on Mondays, Wednesdays and Fridays

## 2010-12-04 NOTE — Medication Information (Signed)
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 no    Recent/future dental: no  Any missed doses?: no       Is patient compliant with meds? yes       Allergies: 1)  ! Erythromycin  Anticoagulation Management History:      The patient is taking warfarin and comes in today for a routine follow up visit.  Positive risk factors for bleeding include an age of 75 years or older.  The bleeding index is 'intermediate risk'.  Positive CHADS2 values include History of HTN and Age > 24 years old.  The start date was 02/21/2006.  Anticoagulation responsible provider: Dietrich Pates MD, Molly Maduro.  INR POC: 2.9.  Cuvette Lot#: [id number].  Exp: 10/11.    Anticoagulation Management Assessment/Plan:      The patient's current anticoagulation dose is Warfarin sodium 2 mg tabs: Use as directed by Anticoagualtion Clinic, Coumadin 2 mg tabs: Sunday - 2 tabs, Monday - 3 tabs, Tuesday - 2 tabs, Wednesday - 3 tabs, Thursday - 2 tabs, Friday - 3 tabs, Saturday - 2 tabs.  The target INR is 2 - 3.  The next INR is due 08/20/2010.  Anticoagulation instructions were given to patient.  Results were reviewed/authorized by Vashti Hey, RN.  He was notified by Vashti Hey RN.         Prior Anticoagulation Instructions: INR 3.1 Continue coumadin 4mg  once daily except 6mg  on Mondays, Wednesdays and Fridays Increase greens Has already taken coumadin this morning  Current Anticoagulation Instructions: INR 2.9 Continue coumadin 4mg  once daily except 6mg  on Mondays, Wednesdays and Fridays

## 2010-12-04 NOTE — Medication Information (Signed)
Summary: ccr-lr  Anticoagulant Therapy  Managed by: Vashti Hey, RN Supervising MD: Dietrich Pates MD, Molly Maduro Indication 1: Pulmonary Embolism and Infarction (ICD-415.1) Lab Used: [facility] [place] Site: [place] INR POC 3.7  Dietary changes: no    Health status changes: no    Bleeding/hemorrhagic complications: no    Recent/future hospitalizations: no    Any changes in medication regimen? no    Recent/future dental: no  Any missed doses?: yes     Details: might have missed 1dose    Allergies: No Known Drug Allergies  Anticoagulation Management History:      The patient is taking warfarin and comes in today for a routine follow up visit.  Positive risk factors for bleeding include an age of 75 years or older.  The bleeding index is 'intermediate risk'.  Positive CHADS2 values include Age > 86 years old.  The start date was 02/21/2006.  Anticoagulation responsible provider: Dietrich Pates MD, Molly Maduro.  INR POC: 3.7.  Cuvette Lot#: [id number].  Exp: 10/11.    Anticoagulation Management Assessment/Plan:      The patient's current anticoagulation dose is Warfarin sodium 2 mg tabs: Use as directed by Anticoagualtion Clinic, Coumadin 2 mg tabs: Sunday - 2 tabs, Monday - 3 tabs, Tuesday - 2 tabs, Wednesday - 3 tabs, Thursday - 2 tabs, Friday - 3 tabs, Saturday - 2 tabs.  The target INR is 2 - 3.  The next INR is due 03/28/2010.  Anticoagulation instructions were given to patient.  Results were reviewed/authorized by Vashti Hey, RN.  He was notified by Vashti Hey RN.         Prior Anticoagulation Instructions: INR 2.5 Continue coumadin 4mg  once daily except 6mg  on Mondays, Wednesdays and Fridays  Current Anticoagulation Instructions: INR 3.7 Hold coumadin tomorrow then resume 4mg  once daily except 6mg  on Mondays, Wednesdays and Fridays

## 2010-12-04 NOTE — Medication Information (Signed)
Summary: ccr-lr  Anticoagulant Therapy  Managed by: Vashti Hey, RN PCP: Lucianne Lei Supervising MD: Daleen Squibb MD, Maisie Fus Indication 1: Pulmonary Embolism and Infarction (ICD-415.1) Lab Used: [facility] [place] Site: [place] INR POC 3.1  Dietary changes: yes       Details: Had less Vit K foods this month  Health status changes: no    Bleeding/hemorrhagic complications: no    Recent/future hospitalizations: no    Any changes in medication regimen? no    Recent/future dental: no  Any missed doses?: no       Is patient compliant with meds? yes       Allergies: 1)  ! Erythromycin  Anticoagulation Management History:      The patient is taking warfarin and comes in today for a routine follow up visit.  Positive risk factors for bleeding include an age of 104 years or older.  The bleeding index is 'intermediate risk'.  Positive CHADS2 values include History of HTN and Age > 30 years old.  The start date was 02/21/2006.  Anticoagulation responsible provider: Daleen Squibb MD, Maisie Fus.  INR POC: 3.1.  Cuvette Lot#: [id number].  Exp: 10/11.    Anticoagulation Management Assessment/Plan:      The patient's current anticoagulation dose is Warfarin sodium 2 mg tabs: Use as directed by Anticoagualtion Clinic, Coumadin 2 mg tabs: Sunday - 2 tabs, Monday - 3 tabs, Tuesday - 2 tabs, Wednesday - 3 tabs, Thursday - 2 tabs, Friday - 3 tabs, Saturday - 2 tabs.  The target INR is 2 - 3.  The next INR is due 07/23/2010.  Anticoagulation instructions were given to patient.  Results were reviewed/authorized by Vashti Hey, RN.  He was notified by Vashti Hey RN.         Prior Anticoagulation Instructions: INR 2.8 Continue coumadin 4mg  once daily except 6mg  on Mondays, Wednesdays and Fridays  Current Anticoagulation Instructions: INR 3.1 Continue coumadin 4mg  once daily except 6mg  on Mondays, Wednesdays and Fridays Increase greens Has already taken coumadin this morning

## 2010-12-04 NOTE — Medication Information (Signed)
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 no    Recent/future dental: no  Any missed doses?: no       Is patient compliant with meds? yes       Allergies: No Known Drug Allergies  Anticoagulation Management History:      The patient is taking warfarin and comes in today for a routine follow up visit.  Positive risk factors for bleeding include an age of 75 years or older.  The bleeding index is 'intermediate risk'.  Positive CHADS2 values include Age > 75 years old.  The start date was 02/21/2006.  Anticoagulation responsible provider: Daleen Squibb MD, Maisie Fus.  INR POC: 2.0.  Cuvette Lot#: [id number].  Exp: 10/11.    Anticoagulation Management Assessment/Plan:      The patient's current anticoagulation dose is Warfarin sodium 2 mg tabs: Use as directed by Anticoagualtion Clinic, Coumadin 2 mg tabs: Sunday - 2 tabs, Monday - 3 tabs, Tuesday - 2 tabs, Wednesday - 3 tabs, Thursday - 2 tabs, Friday - 3 tabs, Saturday - 2 tabs.  The target INR is 2 - 3.  The next INR is due 02/05/2010.  Anticoagulation instructions were given to patient.  Results were reviewed/authorized by Vashti Hey, RN.  He was notified by Vashti Hey RN.         Prior Anticoagulation Instructions: INR 1.8 Take coumadin 4 tablets tonight then increase dose to 2 tablets once daily except 3 tablets on Mondays and Thursdays  Current Anticoagulation Instructions: INR 2.0 Increase coumadin to 4mg  once daily except 6mg  on Mondays, Wednesdays and Fridays Prescriptions: COUMADIN 2 MG TABS (WARFARIN SODIUM) Sunday - 2 tabs, Monday - 3 tabs, Tuesday - 2 tabs,  Wednesday - 3 tabs, Thursday - 2 tabs, Friday - 3 tabs, Saturday - 2 tabs  #240 x 3   Entered by:   Vashti Hey RN   Authorized by:   Kathlen Brunswick, MD, [facility]   Signed by:   Vashti Hey RN on 01/08/2010   Method used:   Print then Give to Patient   RxID:   [id number]

## 2010-12-04 NOTE — Assessment & Plan Note (Signed)
Summary: rov pt looseing felling in feet already seen pcp  Medications Added ATACAND 16 MG TABS (CANDESARTAN CILEXETIL) take 1 tab daily SIMVASTATIN 40 MG TABS (SIMVASTATIN) take 1 tab daily ATENOLOL 25 MG TABS (ATENOLOL) take 1 tab daily FLOMAX 0.4 MG CAPS (TAMSULOSIN HCL) take 1 tab at night ALLEGRA 180 MG TABS (FEXOFENADINE HCL) take 1 tab daily NITROLINGUAL 0.4 MG/SPRAY SOLN (NITROGLYCERIN) use as needed for chestpain PERCOCET 5-325 MG TABS (OXYCODONE-ACETAMINOPHEN) take as needed      Allergies Added: NKDA  Visit Type:  Follow-up Primary Provider:  mckay,james  CC:  Leg Pain.  History of Present Illness: Lance Grant is a pleasant 77 y/o AAM with known history of    1. Pulmonary embolism on Coumadin tx (followed by [facility])  2.  Left lower extremity deep venous thrombosis.  3.  Chronic renal insufficiency.  4.  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  Coronary artery disease, status post coronary artery bypass graft (LIMA-LAD, SVG to Intermediate       (12/02 [facility])  6.  Treated dyslipidemia.  7.  History of chronic obstructive pulmonary disease.  8.  Overall LV systolic function is normal with an LVEF of approximately        65%.  RV EF is normal per echo 03/2008  9   Hypertension  He was last seen 2 years  ago with complaints of LE pain, numbness.  ABI's were ordered by Dr. Dietrich Pates at that time (dated 06-21-2008).  They revealed diffuse bilateral lower extremity obstructive disease.  Elevated pressures noted within the right thigh. Calcified vessels artificially elevating blood pressure measurements.   He had a follow-up appointment with Dr.Rothbart on 07/08/08 and per note, his symptoms were not consistent with claudication. He was referred to Dr. Lucianne Lei, primary care for futher assessment.  He did not follow-up until this date.  He is having worsening symptoms of heaviness in his legs and feet.  Numbness bilaterally, and actually  losing feelings bilaterally, causing his legs to go out from under him. He denies pain with walking, but has occasional LE swelling. He is becoming more concerned about these  worsening symptoms and wishes further evaluation. He denies chest pain, DOE, fatigue, dizziness or syncope.   Current Medications (verified): 1)  Warfarin Sodium 2 Mg Tabs (Warfarin Sodium) .... Use As Directed By Anticoagualtion Clinic 2)  Coumadin 2 Mg Tabs (Warfarin Sodium) .... Sunday - 2 Tabs, Monday - 3 Tabs, Tuesday - 2 Tabs, Wednesday - 3 Tabs, Thursday - 2 Tabs, Friday - 3 Tabs, Saturday - 2 Tabs 3)  Atacand 16 Mg Tabs (Candesartan Cilexetil) .... Take 1 Tab Daily 4)  Simvastatin 40 Mg Tabs (Simvastatin) .... Take 1 Tab Daily 5)  Atenolol 25 Mg Tabs (Atenolol) .... Take 1 Tab Daily 6)  Flomax 0.4 Mg Caps (Tamsulosin Hcl) .... Take 1 Tab At Night 7)  Allegra 180 Mg Tabs (Fexofenadine Hcl) .... Take 1 Tab Daily 8)  Nitrolingual 0.4 Mg/spray Soln (Nitroglycerin) .... Use As Needed For Chestpain 9)  Percocet 5-325 Mg Tabs (Oxycodone-Acetaminophen) .... Take As Needed  Allergies (verified): No Known Drug Allergies  Past History:  Past medical, surgical, family and social histories (including risk factors) reviewed, and no changes noted (except as noted below).  Past Medical History: Reviewed history from 03/08/2010 and no changes required. .Current Problems:  COPD (ICD-496) by pft's remote cigs EFFUSION, PLEURAL (ICD-511.9)03/2006 RENAL INSUFFICIENCY (ICD-588.9)mild cr -1.9 08/2005,1.5 02/2007,1.7 12/2007,1.7 06/2008 VESICULITIS, SEMINAL (ICD-608.0) GERD (  ICD-530.81) DYSLIPIDEMIA (ICD-272.4) HYPERTENSION (ICD-401.9) SINUS BRADYCARDIA (ICD-427.81) on beta blockers ATHEROSCLEROTIC CARDIOVASCULAR DISEASE (ICD-429.2) coronary artery bypass graft 10/2001 nl EF  Past Surgical History: Reviewed history from 03/08/2010 and no changes required. coronary artery bypass graft 10/2001 turp 07/2005  Family  History: Reviewed history from 03/08/2010 and no changes required. Father:deceased age 37 due to natural causes Mother:deceased age 21  Siblings:1 brother age 75 alive and well 1 sister alive and well  Social History: Reviewed history from 03/08/2010 and no changes required. Retired  Married  Tobacco Use - Former.  Alcohol Use - no Regular Exercise - no Drug Use - no  Review of Systems       All other systems have been reviewed and are negative unless stated above.   Vital Signs:  Patient profile:   77 year old male Height:      71 inches Weight:      198 pounds BMI:     27.72 Pulse rate:   61 / minute BP sitting:   162 / 81  (right arm)  Vitals Entered By: Dreama Saa, CNA (Mar 08, 2010 1:37 PM)  Physical Exam  General:  Well developed, well nourished, in no acute distress. Head:  normocephalic and atraumatic Eyes:  PERRLA/EOM intact; conjunctiva and lids normal. Ears:  TM's intact and clear with normal canals and hearing Nose:  no deformity, discharge, inflammation, or lesions Mouth:  Teeth, gums and palate normal. Oral mucosa normal. Neck:  Neck supple, no JVD. No masses, thyromegaly or abnormal cervical nodes. Lungs:  Clear bilaterally to auscultation and percussion. Heart:  Occasional irregular rhythum.  1/6 systolic murmur Distant heart sounds Abdomen:  Bowel sounds positive; abdomen soft and non-tender without masses, organomegaly, or hernias noted. No hepatosplenomegaly. No abdominal bruits Msk:  Back normal, normal gait. Muscle strength and tone normal. Extremities:  No bruits of femoral arteries bilaterally.  Popletial pulses are palpable.  DP pulses difficult to palpate.  No edema.  Warm extremities bilaterally. Neurologic:  Alert and oriented x 3. Psych:  Normal affect.   EKG  Procedure date:  03/08/2010  Findings:      Normal sinus rhythm with rate of:  60 bpm.  Sinus arrythmia. T-Wave inversion V6.  Impression & Recommendations:  Problem #  1:  PERIPHERAL CIRCULATORY DISORDER (ICD-V12.59) With is symptoms and abnormal ABI dated 2009, will refer to Dr.Cooper or McAlhany for futher assessment and recommendations.  Did not start him on Pletal at this time, as he is not complaining of pain when he walks, only numbness, heaviness, and lack of strength in legs, causing him not to bear wt.  Vascular assessment did not reveal femoral bruits,but distal pulses were difficult to palpate. Will continue his coumadin until he is assessed. His updated medication list for this problem includes:    Warfarin Sodium 2 Mg Tabs (Warfarin sodium) ..... Use as directed by anticoagualtion clinic    Coumadin 2 Mg Tabs (Warfarin sodium) ..... Sunday - 2 tabs, monday - 3 tabs, tuesday - 2 tabs, wednesday - 3 tabs, thursday - 2 tabs, friday - 3 tabs, saturday - 2 tabs    Atenolol 25 Mg Tabs (Atenolol) .Marland Kitchen... Take 1 tab daily    Nitrolingual 0.4 Mg/spray Soln (Nitroglycerin) ..... Use as needed for chestpain  Problem # 2:  CAD, ARTERY BYPASS GRAFT (ICD-414.04) Assessment: Unchanged He denies symptoms.  Last Myoview showed minimal area of ischemia in 2009. His updated medication list for this problem includes:    Warfarin Sodium  2 Mg Tabs (Warfarin sodium) ..... Use as directed by anticoagualtion clinic    Coumadin 2 Mg Tabs (Warfarin sodium) ..... Sunday - 2 tabs, monday - 3 tabs, tuesday - 2 tabs, wednesday - 3 tabs, thursday - 2 tabs, friday - 3 tabs, saturday - 2 tabs    Atenolol 25 Mg Tabs (Atenolol) .Marland Kitchen... Take 1 tab daily    Nitrolingual 0.4 Mg/spray Soln (Nitroglycerin) ..... Use as needed for chestpain  Problem # 3:  PULMONARY EMBOLISM (ICD-415.19) He will continue his coumadin as directed.  Will leave to referral physican to make recommendations on holding this should further testing be needed. His updated medication list for this problem includes:    Warfarin Sodium 2 Mg Tabs (Warfarin sodium) ..... Use as directed by anticoagualtion clinic    Coumadin  2 Mg Tabs (Warfarin sodium) ..... Sunday - 2 tabs, monday - 3 tabs, tuesday - 2 tabs, wednesday - 3 tabs, thursday - 2 tabs, friday - 3 tabs, saturday - 2 tabs  Problem # 4:  HYPERTENSION, BENIGN (ICD-401.1) Slightly elevated today.  Will continue his meds and monitor this. His updated medication list for this problem includes:    Atacand 16 Mg Tabs (Candesartan cilexetil) .Marland Kitchen... Take 1 tab daily    Atenolol 25 Mg Tabs (Atenolol) .Marland Kitchen... Take 1 tab daily  Other Orders: Cardiology Referral (Cardiology)  Patient Instructions: 1)  You have been referred to Dr. Excell Seltzer or Dr. Clifton James for PAD.  2)  Your physician recommends that you continue on your current medications as directed. Please refer to the Current Medication list given to you today.

## 2010-12-04 NOTE — Medication Information (Signed)
Summary: ccr-lr  Anticoagulant Therapy  Managed by: Vashti Hey, RN PCP: Lucianne Lei Supervising MD: Diona Browner MD, Remi Deter Indication 1: Pulmonary Embolism and Infarction (ICD-415.1) Lab Used[facility]  Site: [place] INR POC 2.6  Dietary changes: no    Health status changes: no    Bleeding/hemorrhagic complications: no    Recent/future hospitalizations: no    Any changes in medication regimen? no    Recent/future dental: no  Any missed doses?: no       Is patient compliant with meds? yes       Allergies: 1)  ! Erythromycin  Anticoagulation Management History:      The patient is taking warfarin and comes in today for a routine follow up visit.  Positive risk factors for bleeding include an age of 75 years or older.  The bleeding index is 'intermediate risk'.  Positive CHADS2 values include History of HTN and Age > 75 years old.  The start date was 02/21/2006.  Anticoagulation responsible provider: Diona Browner MD, Remi Deter.  INR POC: 2.6.  Cuvette Lot#: [id number].  Exp: 10/11.    Anticoagulation Management Assessment/Plan:      The patient's current anticoagulation dose is Warfarin sodium 2 mg tabs: Use as directed by Anticoagualtion Clinic, Coumadin 2 mg tabs: Sunday - 2 tabs, Monday - 3 tabs, Tuesday - 2 tabs, Wednesday - 3 tabs, Thursday - 2 tabs, Friday - 3 tabs, Saturday - 2 tabs.  The target INR is 2 - 3.  The next INR is due 09/17/2010.  Anticoagulation instructions were given to patient.  Results were reviewed/authorized by Vashti Hey, RN.  He was notified by Vashti Hey RN.         Prior Anticoagulation Instructions: INR 2.9 Continue coumadin 4mg  once daily except 6mg  on Mondays, Wednesdays and Fridays  Current Anticoagulation Instructions: INR 2.6 Continue coumadin 4mg  once daily except 6mg  on Mondays, Wednesdays and Fridays

## 2010-12-04 NOTE — Medication Information (Signed)
Summary: ccr-lr  Anticoagulant Therapy  Managed by: Vashti Hey, RN PCP: Lucianne Lei Supervising MD: Dietrich Pates MD, Molly Maduro Indication 1: Pulmonary Embolism and Infarction (ICD-415.1) Lab Used: [facility] [place] Site: [place] INR POC 2.8  Dietary changes: no    Health status changes: no    Bleeding/hemorrhagic complications: no    Recent/future hospitalizations: no    Any changes in medication regimen? no    Recent/future dental: no  Any missed doses?: no       Is patient compliant with meds? yes       Allergies: 1)  ! Erythromycin  Anticoagulation Management History:      The patient is taking warfarin and comes in today for a routine follow up visit.  Positive risk factors for bleeding include an age of 75 years or older.  The bleeding index is 'intermediate risk'.  Positive CHADS2 values include History of HTN and Age > 52 years old.  The start date was 02/21/2006.  Anticoagulation responsible provider: Dietrich Pates MD, Molly Maduro.  INR POC: 2.8.  Cuvette Lot#: [id number].  Exp: 10/11.    Anticoagulation Management Assessment/Plan:      The patient's current anticoagulation dose is Warfarin sodium 2 mg tabs: Use as directed by Anticoagualtion Clinic, Coumadin 2 mg tabs: Sunday - 2 tabs, Monday - 3 tabs, Tuesday - 2 tabs, Wednesday - 3 tabs, Thursday - 2 tabs, Friday - 3 tabs, Saturday - 2 tabs.  The target INR is 2 - 3.  The next INR is due 06/25/2010.  Anticoagulation instructions were given to patient.  Results were reviewed/authorized by Vashti Hey, RN.  He was notified by Vashti Hey RN.         Prior Anticoagulation Instructions: INR 2.8 Continue coumadin 4mg  once daily except 6mg  on Mondays, Wednesdays and Fridays  Current Anticoagulation Instructions: Same as Prior Instructions.

## 2010-12-04 NOTE — Medication Information (Signed)
Summary: ccr-lr  Anticoagulant Therapy  Managed by: Vashti Hey, RN Supervising MD: Dietrich Pates MD, Molly Maduro Indication 1: Pulmonary Embolism and Infarction (ICD-415.1) Lab Used: [facility] [place] Site: [place] INR POC 1.8  Dietary changes: no    Health status changes: no    Bleeding/hemorrhagic complications: no    Recent/future hospitalizations: no    Any changes in medication regimen? no    Recent/future dental: no  Any missed doses?: no       Is patient compliant with meds? yes       Allergies: No Known Drug Allergies  Anticoagulation Management History:      The patient is taking warfarin and comes in today for a routine follow up visit.  Positive risk factors for bleeding include an age of 75 years or older.  The bleeding index is 'intermediate risk'.  Positive CHADS2 values include Age > 94 years old.  The start date was 02/21/2006.  Anticoagulation responsible provider: Dietrich Pates MD, Molly Maduro.  INR POC: 1.8.  Cuvette Lot#: [id number].  Exp: 10/11.    Anticoagulation Management Assessment/Plan:      The patient's current anticoagulation dose is Warfarin sodium 2 mg tabs: Use as directed by Anticoagualtion Clinic, Coumadin 2 mg tabs: Sunday - 2 tabs, Monday - 3 tabs, Tuesday - 2 tabs, Wednesday - 2 tabs, Thursday - 2 tabs, Friday - 2 tabs, Saturday - 2 tabs.  The target INR is 2 - 3.  The next INR is due 01/08/2010.  Anticoagulation instructions were given to patient.  Results were reviewed/authorized by Vashti Hey, RN.  He was notified by Vashti Hey RN.         Prior Anticoagulation Instructions: INR 2.2 Continue coumadin 4mg  once daily except 6mg  on Mondays  Current Anticoagulation Instructions: INR 1.8 Take coumadin 4 tablets tonight then increase dose to 2 tablets once daily except 3 tablets on Mondays and Thursdays

## 2010-12-04 NOTE — Progress Notes (Signed)
Summary: NEEDS TOOTH PULLED   Phone Note Call from Patient Call back at Home Phone [phone] Call back at [phone]   Caller: PT Reason for Call: Talk to Nurse Summary of Call: PER PT WALK IN, HE HAD CCR CHECK YESTERDAY AND THEN WAS SEEN BY DENTIST AND NEEDS TO HAVE A TOOTH PULLED. WOULD LIKE TO KNOW WHAT TO DO.  Initial call taken by: Faythe Ghee,  May 29, 2010 11:44 AM  Follow-up for Phone Call        Is it ok for pt to hold coumadin for dental extraction? Follow-up by: Vashti Hey RN,  May 29, 2010 11:55 AM  Additional Follow-up for Phone Call Additional follow up Details #1::        Pt told ok to hold coumadin 3 days prior to dental extraction and resume night of procedure if ok with dentist. Additional Follow-up by: Vashti Hey RN,  May 31, 2010 1:29 PM

## 2010-12-04 NOTE — Medication Information (Signed)
Summary: ccr-lr  Anticoagulant Therapy  Managed by: Vashti Hey, RN Supervising MD: Diona Browner MD, Remi Deter Indication 1: Pulmonary Embolism and Infarction (ICD-415.1) Lab Used: [facility] [place] Site: [place] INR POC 1.7  Dietary changes: no    Health status changes: no    Bleeding/hemorrhagic complications: no    Recent/future hospitalizations: no    Any changes in medication regimen? no    Recent/future dental: no  Any missed doses?: no       Is patient compliant with meds? yes       Allergies: No Known Drug Allergies  Anticoagulation Management History:      The patient is taking warfarin and comes in today for a routine follow up visit.  Positive risk factors for bleeding include an age of 1 years or older.  The bleeding index is 'intermediate risk'.  Positive CHADS2 values include Age > 39 years old.  The start date was 02/21/2006.  Anticoagulation responsible provider: Diona Browner MD, Remi Deter.  INR POC: 1.7.  Cuvette Lot#: [id number].  Exp: 10/11.    Anticoagulation Management Assessment/Plan:      The patient's current anticoagulation dose is Warfarin sodium 2 mg tabs: Use as directed by Anticoagualtion Clinic, Coumadin 2 mg tabs: Sunday - 2 tabs, Monday - 3 tabs, Tuesday - 2 tabs, Wednesday - 2 tabs, Thursday - 2 tabs, Friday - 2 tabs, Saturday - 2 tabs.  The target INR is 2 - 3.  The next INR is due 11/27/2009.  Anticoagulation instructions were given to patient.  Results were reviewed/authorized by Vashti Hey, RN.  He was notified by Vashti Hey RN.         Prior Anticoagulation Instructions: INR 3.2 Pt has taken coumadin this am Take coumadin 1/2 tablet tomorrow then resume 2 tablets once daily except 3 tablets on Mondays  Current Anticoagulation Instructions: INR 1.7 Take coumadin extra 1 1/2 tablets today then resume 2 tablets once daily except 3 tablets on Mondays

## 2010-12-04 NOTE — Medication Information (Signed)
Summary: ccr-lr  Anticoagulant Therapy  Managed by: Vashti Hey, RN PCP: Marguerita Merles MD: Diona Browner MD, Remi Deter Indication 1: Pulmonary Embolism and Infarction (ICD-415.1) Lab Used: [facility] [place] Site: [place] INR POC 2.8  Dietary changes: no    Health status changes: no    Bleeding/hemorrhagic complications: no    Recent/future hospitalizations: no    Any changes in medication regimen? no    Recent/future dental: no  Any missed doses?: no       Is patient compliant with meds? yes       Allergies: No Known Drug Allergies  Anticoagulation Management History:      The patient is taking warfarin and comes in today for a routine follow up visit.  Positive risk factors for bleeding include an age of 75 years or older.  The bleeding index is 'intermediate risk'.  Positive CHADS2 values include History of HTN and Age > 38 years old.  The start date was 02/21/2006.  Anticoagulation responsible provider: Diona Browner MD, Remi Deter.  INR POC: 2.8.  Cuvette Lot#: [id number].  Exp: 10/11.    Anticoagulation Management Assessment/Plan:      The patient's current anticoagulation dose is Warfarin sodium 2 mg tabs: Use as directed by Anticoagualtion Clinic, Coumadin 2 mg tabs: Sunday - 2 tabs, Monday - 3 tabs, Tuesday - 2 tabs, Wednesday - 3 tabs, Thursday - 2 tabs, Friday - 3 tabs, Saturday - 2 tabs.  The target INR is 2 - 3.  The next INR is due 04/30/2010.  Anticoagulation instructions were given to patient.  Results were reviewed/authorized by Vashti Hey, RN.  He was notified by Vashti Hey RN.         Prior Anticoagulation Instructions: INR 3.7 Hold coumadin tomorrow then resume 4mg  once daily except 6mg  on Mondays, Wednesdays and Fridays   Current Anticoagulation Instructions: INR 2.8 Continue coumadin 4mg  once daily except 6mg  on Mondays, Wednesdays and Fridays

## 2010-12-04 NOTE — Medication Information (Signed)
Summary: ccr-lr  Anticoagulant Therapy  Managed by: Vashti Hey, RN PCP: Lucianne Lei Supervising MD: Dietrich Pates MD, Molly Maduro Indication 1: Pulmonary Embolism and Infarction (ICD-415.1) Lab Used: [facility] [place] Site:  INR POC 2.3  Dietary changes: no    Health status changes: no    Bleeding/hemorrhagic complications: no    Recent/future hospitalizations: no    Any changes in medication regimen? no    Recent/future dental: no  Any missed doses?: no       Is patient compliant with meds? yes       Allergies: 1)  ! Erythromycin  Anticoagulation Management History:      The patient is taking warfarin and comes in today for a routine follow up visit.  Positive risk factors for bleeding include an age of 75 years or older.  The bleeding index is 'intermediate risk'.  Positive CHADS2 values include History of HTN and Age > 39 years old.  The start date was 02/21/2006.  Anticoagulation responsible Monasia Lair: Dietrich Pates MD, Molly Maduro.  INR POC: 2.3.  Cuvette Lot#: [id number].  Exp: 10/11.    Anticoagulation Management Assessment/Plan:      The patient's current anticoagulation dose is Warfarin sodium 2 mg tabs: Use as directed by Anticoagualtion Clinic, Coumadin 2 mg tabs: Sunday - 2 tabs, Monday - 3 tabs, Tuesday - 2 tabs, Wednesday - 3 tabs, Thursday - 2 tabs, Friday - 3 tabs, Saturday - 2 tabs.  The target INR is 2 - 3.  The next INR is due 10/15/2010.  Anticoagulation instructions were given to patient.  Results were reviewed/authorized by Vashti Hey, RN.  He was notified by Vashti Hey RN.         Prior Anticoagulation Instructions: INR 2.6 Continue coumadin 4mg  once daily except 6mg  on Mondays, Wednesdays and Fridays  Current Anticoagulation Instructions: INR 2.3 Continue coumadin 4mg  once daily except 6mg  on Mondays, Wednesdays and Fridays

## 2010-12-06 NOTE — Medication Information (Signed)
Summary: ccr-lr  Anticoagulant Therapy  Managed by: Vashti Hey, RN PCP: Lucianne Lei Supervising MD: Daleen Squibb MD, Maisie Fus Indication 1: Pulmonary Embolism and Infarction (ICD-415.1) Lab Used: [facility] [place] Site: [place] INR POC 3.3  Dietary changes: no    Health status changes: no    Bleeding/hemorrhagic complications: no    Recent/future hospitalizations: no    Any changes in medication regimen? no    Recent/future dental: no  Any missed doses?: no       Is patient compliant with meds? yes       Allergies: 1)  ! Erythromycin  Anticoagulation Management History:      The patient is taking warfarin and comes in today for a routine follow up visit.  Positive risk factors for bleeding include an age of 75 years or older.  The bleeding index is 'intermediate risk'.  Positive CHADS2 values include History of HTN and Age > 35 years old.  The start date was 02/21/2006.  Anticoagulation responsible provider: Daleen Squibb MD, Maisie Fus.  INR POC: 3.3.  Cuvette Lot#: [id number].  Exp: 10/11.    Anticoagulation Management Assessment/Plan:      The patient's current anticoagulation dose is Warfarin sodium 2 mg tabs: Use as directed by Anticoagualtion Clinic, Coumadin 2 mg tabs: Sunday - 2 tabs, Monday - 3 tabs, Tuesday - 2 tabs, Wednesday - 3 tabs, Thursday - 2 tabs, Friday - 3 tabs, Saturday - 2 tabs.  The target INR is 2 - 3.  The next INR is due 11/12/2010.  Anticoagulation instructions were given to patient.  Results were reviewed/authorized by Vashti Hey, RN.  He was notified by Vashti Hey RN.         Prior Anticoagulation Instructions: INR 2.3 Continue coumadin 4mg  once daily except 6mg  on Mondays, Wednesdays and Fridays  Current Anticoagulation Instructions: INR 3.3 Take coumadin 1 tablet tomorrow night then resume 2 tablets once daily except 3 tablets on Mondays, Wednesdays and Fridays

## 2010-12-06 NOTE — Medication Information (Signed)
Summary: ccr-lr  Anticoagulant Therapy  Managed by: Vashti Hey, RN PCP: Lucianne Lei Supervising MD: Dietrich Pates MD, Molly Maduro Indication 1: Pulmonary Embolism and Infarction (ICD-415.1) Lab Used: [facility] [place] Site: [place] INR POC 2.9  Dietary changes: no    Health status changes: no    Bleeding/hemorrhagic complications: no    Recent/future hospitalizations: no    Any changes in medication regimen? no    Recent/future dental: no  Any missed doses?: no       Is patient compliant with meds? yes       Allergies: 1)  ! Erythromycin  Anticoagulation Management History:      The patient is taking warfarin and comes in today for a routine follow up visit.  Positive risk factors for bleeding include an age of 27 years or older.  The bleeding index is 'intermediate risk'.  Positive CHADS2 values include History of HTN and Age > 68 years old.  The start date was 02/21/2006.  Anticoagulation responsible provider: Dietrich Pates MD, Molly Maduro.  INR POC: 2.9.  Cuvette Lot#: [id number].  Exp: 10/11.    Anticoagulation Management Assessment/Plan:      The patient's current anticoagulation dose is Warfarin sodium 2 mg tabs: Use as directed by Anticoagualtion Clinic, Coumadin 2 mg tabs: Sunday - 2 tabs, Monday - 3 tabs, Tuesday - 2 tabs, Wednesday - 3 tabs, Thursday - 2 tabs, Friday - 3 tabs, Saturday - 2 tabs.  The target INR is 2 - 3.  The next INR is due 12/12/2010.  Anticoagulation instructions were given to patient.  Results were reviewed/authorized by Vashti Hey, RN.  He was notified by Vashti Hey RN.         Prior Anticoagulation Instructions: INR 3.3 Take coumadin 1 tablet tomorrow night then resume 2 tablets once daily except 3 tablets on Mondays, Wednesdays and Fridays  Current Anticoagulation Instructions: INR 2.9 Continue coumadin 4mg  once daily except 6mg  on Mondays, Wdnesdays and Fridays

## 2010-12-12 ENCOUNTER — Encounter: Payer: Self-pay | Admitting: Cardiology

## 2010-12-12 ENCOUNTER — Encounter (INDEPENDENT_AMBULATORY_CARE_PROVIDER_SITE_OTHER): Payer: Medicare Other

## 2010-12-12 DIAGNOSIS — I2699 Other pulmonary embolism without acute cor pulmonale: Secondary | ICD-10-CM

## 2010-12-12 DIAGNOSIS — Z7901 Long term (current) use of anticoagulants: Secondary | ICD-10-CM

## 2010-12-12 LAB — CONVERTED CEMR LAB: POC INR: 2.8

## 2010-12-20 NOTE — Medication Information (Signed)
Summary: ccr-lr [place]  Lab Visit  Orders Today:  Anticoagulant Therapy  Managed by: Vashti Hey, RN PCP: Lucianne Lei Supervising MD: Dietrich Pates MD, Molly Maduro Indication 1: Pulmonary Embolism and Infarction (ICD-415.1) Lab Used: [facility] [place] Site: [place] INR POC 2.8  Dietary changes: no    Health status changes: no    Bleeding/hemorrhagic complications: no    Recent/future hospitalizations: no    Any changes in medication regimen? no    Recent/future dental: no  Any missed doses?: no       Is patient compliant with meds? yes         Anticoagulation Management History:      The patient is taking warfarin and comes in today for a routine follow up visit.  Positive risk factors for bleeding include an age of 75 years or older.  The bleeding index is 'intermediate risk'.  Positive CHADS2 values include History of HTN and Age > 75 years old.  The start date was 02/21/2006.  Anticoagulation responsible provider: Dietrich Pates MD, Molly Maduro.  INR POC: 2.8.  Cuvette Lot#: [id number].  Exp: 10/11.    Anticoagulation Management Assessment/Plan:      The patient's current anticoagulation dose is Warfarin sodium 2 mg tabs: Use as directed by Anticoagualtion Clinic, Coumadin 2 mg tabs: Sunday - 2 tabs, Monday - 3 tabs, Tuesday - 2 tabs, Wednesday - 3 tabs, Thursday - 2 tabs, Friday - 3 tabs, Saturday - 2 tabs.  The target INR is 2 - 3.  The next INR is due 01/09/2011.  Anticoagulation instructions were given to patient.  Results were reviewed/authorized by Vashti Hey, RN.  He was notified by Vashti Hey RN.         Prior Anticoagulation Instructions: INR 2.9 Continue coumadin 4mg  once daily except 6mg  on Mondays, Wdnesdays and Fridays  Current Anticoagulation Instructions: INR 2.8 Continue coumadin 4mg  once daily except 6mg  on Mondays, Wednesdays and Fridays

## 2011-01-09 ENCOUNTER — Encounter: Payer: Self-pay | Admitting: Cardiovascular Disease

## 2011-01-09 ENCOUNTER — Encounter (INDEPENDENT_AMBULATORY_CARE_PROVIDER_SITE_OTHER): Payer: Medicare Other

## 2011-01-09 DIAGNOSIS — I2699 Other pulmonary embolism without acute cor pulmonale: Secondary | ICD-10-CM

## 2011-01-09 DIAGNOSIS — Z7901 Long term (current) use of anticoagulants: Secondary | ICD-10-CM

## 2011-01-09 LAB — CONVERTED CEMR LAB: POC INR: 2.5

## 2011-01-15 NOTE — Medication Information (Signed)
Summary: ccr-lr  Anticoagulant Therapy  Managed by: Vashti Hey, RN PCP: Lucianne Lei Supervising MD: Eden Emms MD, Theron Arista Indication 1: Pulmonary Embolism and Infarction (ICD-415.1) Lab Used: [facility] [place] Site: [place] INR POC 2.5  Dietary changes: no    Health status changes: no    Bleeding/hemorrhagic complications: no    Recent/future hospitalizations: no    Any changes in medication regimen? no    Recent/future dental: no  Any missed doses?: no       Is patient compliant with meds? yes       Allergies: 1)  ! Erythromycin  Anticoagulation Management History:      The patient is taking warfarin and comes in today for a routine follow up visit.  Positive risk factors for bleeding include an age of 20 years or older.  The bleeding index is 'intermediate risk'.  Positive CHADS2 values include History of HTN and Age > 90 years old.  The start date was 02/21/2006.  Anticoagulation responsible provider: Eden Emms MD, Theron Arista.  INR POC: 2.5.  Cuvette Lot#: [id number].  Exp: 10/11.    Anticoagulation Management Assessment/Plan:      The patient's current anticoagulation dose is Warfarin sodium 2 mg tabs: Use as directed by Anticoagualtion Clinic, Coumadin 2 mg tabs: Sunday - 2 tabs, Monday - 3 tabs, Tuesday - 2 tabs, Wednesday - 3 tabs, Thursday - 2 tabs, Friday - 3 tabs, Saturday - 2 tabs.  The target INR is 2 - 3.  The next INR is due 02/06/2011.  Anticoagulation instructions were given to patient.  Results were reviewed/authorized by Vashti Hey, RN.  He was notified by Vashti Hey RN.         Prior Anticoagulation Instructions: INR 2.8 Continue coumadin 4mg  once daily except 6mg  on Mondays, Wednesdays and Fridays  Current Anticoagulation Instructions: INR 2.5 Continue coumadin 4mg  once daily except 6mg  on Mondays, Wednesdays and Fridays

## 2011-01-29 ENCOUNTER — Other Ambulatory Visit (HOSPITAL_COMMUNITY): Payer: Self-pay | Admitting: Urology

## 2011-01-29 DIAGNOSIS — R3129 Other microscopic hematuria: Secondary | ICD-10-CM

## 2011-02-01 ENCOUNTER — Encounter: Payer: Self-pay | Admitting: Cardiovascular Disease

## 2011-02-01 DIAGNOSIS — Z7901 Long term (current) use of anticoagulants: Secondary | ICD-10-CM

## 2011-02-01 DIAGNOSIS — I2699 Other pulmonary embolism without acute cor pulmonale: Secondary | ICD-10-CM

## 2011-02-04 ENCOUNTER — Ambulatory Visit (HOSPITAL_COMMUNITY)
Admission: RE | Admit: 2011-02-04 | Discharge: 2011-02-04 | Disposition: A | Discharge status: Home / Self Care | Payer: Medicare Other | Source: Ambulatory Visit | Attending: Urology | Admitting: Urology

## 2011-02-04 ENCOUNTER — Encounter (HOSPITAL_COMMUNITY): Payer: Self-pay

## 2011-02-04 DIAGNOSIS — R9389 Abnormal findings on diagnostic imaging of other specified body structures: Secondary | ICD-10-CM | POA: Insufficient documentation

## 2011-02-04 DIAGNOSIS — R3129 Other microscopic hematuria: Secondary | ICD-10-CM | POA: Insufficient documentation

## 2011-02-04 HISTORY — DX: Essential (primary) hypertension: I10

## 2011-02-04 MED ORDER — IOHEXOL 300 MG/ML  SOLN
125.0000 mL | Freq: Once | INTRAMUSCULAR | Status: AC | PRN
  Administered 2011-02-04: 125 mL via INTRAVENOUS

## 2011-02-06 ENCOUNTER — Ambulatory Visit (INDEPENDENT_AMBULATORY_CARE_PROVIDER_SITE_OTHER): Payer: Medicare Other | Admitting: *Deleted

## 2011-02-06 DIAGNOSIS — Z7901 Long term (current) use of anticoagulants: Secondary | ICD-10-CM

## 2011-02-06 DIAGNOSIS — I2699 Other pulmonary embolism without acute cor pulmonale: Secondary | ICD-10-CM

## 2011-02-08 ENCOUNTER — Ambulatory Visit (HOSPITAL_COMMUNITY): Admission: RE | Admit: 2011-02-08 | Payer: Medicare Other | Source: Ambulatory Visit

## 2011-02-21 ENCOUNTER — Telehealth: Payer: Self-pay | Admitting: Cardiology

## 2011-02-21 NOTE — Telephone Encounter (Addendum)
Pt needs warfrin 2mg  #240 ordered from [facility] [phone], PT NEEDS PAPER RX TO BRING WITH HIM FOR THIS RX

## 2011-02-22 ENCOUNTER — Other Ambulatory Visit: Payer: Self-pay | Admitting: Infectious Diseases

## 2011-02-22 ENCOUNTER — Encounter (HOSPITAL_COMMUNITY): Payer: Medicare Other

## 2011-02-22 ENCOUNTER — Other Ambulatory Visit: Payer: Self-pay | Admitting: Anesthesiology

## 2011-02-22 LAB — APTT: aPTT: 33 seconds (ref 24–37)

## 2011-02-22 LAB — PROTIME-INR
INR: 2.03 — ABNORMAL HIGH (ref 0.00–1.49)
Prothrombin Time: 23.1 seconds — ABNORMAL HIGH (ref 11.6–15.2)

## 2011-02-22 LAB — BASIC METABOLIC PANEL
BUN: 14 mg/dL (ref 6–23)
CO2: 28 mEq/L (ref 19–32)
Calcium: 9.1 mg/dL (ref 8.4–10.5)
Chloride: 108 mEq/L (ref 96–112)
Creatinine, Ser: 1.49 mg/dL (ref 0.4–1.5)
GFR calc Af Amer: 54 mL/min — ABNORMAL LOW (ref >60)
Glucose, Bld: 92 mg/dL (ref 70–99)

## 2011-02-22 LAB — HEMOGLOBIN AND HEMATOCRIT, BLOOD: HCT: 35.8 % — ABNORMAL LOW (ref 39.0–52.0)

## 2011-02-24 ENCOUNTER — Other Ambulatory Visit: Payer: Self-pay | Admitting: *Deleted

## 2011-02-24 MED ORDER — WARFARIN SODIUM 2 MG PO TABS: 2.0000 mg | ORAL_TABLET | ORAL | Status: DC

## 2011-02-25 ENCOUNTER — Ambulatory Visit (HOSPITAL_COMMUNITY): Payer: Medicare Other

## 2011-02-25 ENCOUNTER — Other Ambulatory Visit: Payer: Self-pay | Admitting: Urology

## 2011-02-25 ENCOUNTER — Ambulatory Visit (HOSPITAL_COMMUNITY)
Admission: RE | Admit: 2011-02-25 | Discharge: 2011-02-25 | Disposition: A | Discharge status: Home / Self Care | Payer: Medicare Other | Source: Ambulatory Visit | Attending: Urology | Admitting: Urology

## 2011-02-25 DIAGNOSIS — Z01812 Encounter for preprocedural laboratory examination: Secondary | ICD-10-CM | POA: Insufficient documentation

## 2011-02-25 DIAGNOSIS — Z79899 Other long term (current) drug therapy: Secondary | ICD-10-CM | POA: Insufficient documentation

## 2011-02-25 DIAGNOSIS — Z01818 Encounter for other preprocedural examination: Secondary | ICD-10-CM | POA: Insufficient documentation

## 2011-02-25 DIAGNOSIS — IMO0002 Reserved for concepts with insufficient information to code with codable children: Secondary | ICD-10-CM | POA: Insufficient documentation

## 2011-02-25 DIAGNOSIS — I10 Essential (primary) hypertension: Secondary | ICD-10-CM | POA: Insufficient documentation

## 2011-02-25 DIAGNOSIS — Z7982 Long term (current) use of aspirin: Secondary | ICD-10-CM | POA: Insufficient documentation

## 2011-02-25 DIAGNOSIS — R3129 Other microscopic hematuria: Secondary | ICD-10-CM | POA: Insufficient documentation

## 2011-02-25 LAB — PROTIME-INR: INR: 1.16 (ref 0.00–1.49)

## 2011-03-06 ENCOUNTER — Ambulatory Visit (INDEPENDENT_AMBULATORY_CARE_PROVIDER_SITE_OTHER): Payer: Medicare Other | Admitting: *Deleted

## 2011-03-06 DIAGNOSIS — Z7901 Long term (current) use of anticoagulants: Secondary | ICD-10-CM

## 2011-03-06 DIAGNOSIS — I2699 Other pulmonary embolism without acute cor pulmonale: Secondary | ICD-10-CM

## 2011-03-19 NOTE — Letter (Signed)
August 07, 2007    Dorise Hiss, M.D.  [address]  [place], [place] [address]   RE:  Lance Grant, Lance Grant  MRN:  [id number]  /  DOB:  09/19/1921   Dear Rosanne Ashing:   Mr. Lance Grant returns to the office for continued assessment and  treatment of coronary disease, cardiovascular risk factors, and prior  pulmonary embolism.  Since his last visit, he has done well.  He  continues to complain of mild discomfort in his upper legs when he is in  bed.  By arising and moving around, this resolves.  He has had no  dyspnea nor chest discomfort.  He has had no major medical events.   CURRENT MEDICATIONS:  1. Simvastatin 40 mg daily.  2. Atenolol 25 mg daily.  3. Aspirin 81 mg daily.  4. Warfarin as directed with stable therapeutic anticoagulation.  5. Atacand 16 mg daily.   A CBC and stool for hemoccult testing were obtained earlier this year  and were normal.  He is awaiting the opportunity to receive influenza  vaccine.  He is currently being treated with penicillin for gingivitis.   EXAM:  Pleasant, sharp gentleman in no acute distress.  The weight is 206, 5 pounds more than in May.  Blood pressure 120/65,  heart rate 66 and regular, respirations 16.  NECK:  No jugular venous distention; normal carotid upstrokes without  bruits.  LUNGS:  Clear.  CARDIAC:  Normal 1st and 2nd heart sounds; grade 2/6 basilar early -  peaking systolic ejection murmur.  ABDOMEN:  Soft and nontender; no organomegaly.  EXTREMITIES:  Trace edema; distal pulses intact.   IMPRESSION:  Lance Grant is doing beautifully.  We will attempt to  obtain the results of a lipid profile performed in your office, as no  such labs are available to [place] for the last few years.  His  echocardiogram performed in May indicated no signs of significant  pulmonary hypertension.  We will reassess prothrombin time since he is  taking antibiotics.  I will see this nice gentleman again in 8 months.    Sincerely,      Gerrit Friends.  Dietrich Pates, MD, [facility]  Electronically Signed    RMR/MedQ  DD: 08/07/2007  DT: 08/07/2007  Job #: [id number]

## 2011-03-19 NOTE — Letter (Signed)
June 16, 2008    J. Darreld Mclean, MD  [phone], [place] [address]   RE:  Lance, Grant  MRN:  [id number]  /  DOB:  [birth year]   Dear Deniece Portela,   It was my pleasure evaluating Lance Grant in the office today at your  request for leg discomfort.  As you know, this nice gentleman has  coronary disease and has done very well since undergoing CABG surgery in  December 2002.  Risk factor control has been optimal.  He has had DVT  and pulmonary embolism.  He now presents with 2 weeks of bilateral leg  discomfort.  He describes this is starting to the knee and progressing  down to the ankle, perhaps somewhat worse distally.  There is no  relationship to exertion; in fact, he feels better once he starts  walking.  The discomfort has occurred every morning before he arises  from bed, but does not awaken him from sleep.  He describes the  intensity is moderate.  There is no tenderness to palpation.  The  symptoms resolved within approximately 1 hour.  He was seen in your  office where symptoms were not thought to be compatible with  musculoskeletal disease and referred to me for possible PVD.   CURRENT MEDICATIONS INCLUDE:  1. Simvastatin 40 mg daily.  2. Atenolol 25 mg daily.  3. Aspirin 81 mg daily.  4. Warfarin as directed.  5. Atacand 16 mg daily.  6. Flomax 0.4 mg daily.   PHYSICAL EXAMINATION:  GENERAL:  Very pleasant gentleman in no acute  distress.  VITAL SIGNS:  The weight is 211, 10 pounds more than in May 2008.  Blood  pressure 125/80, heart rate 70 and regular, and respirations 14.  NECK:  No jugular venous distention.  LUNGS:  Clear.  ABDOMEN:  Soft and nontender.  EXTREMITIES:  No tenderness; full range of motion in the joints; no  joint effusions.  The distal pulses are decreased bilaterally.  By  Doppler, there is a fairly normal sounding almost biphasic left dorsalis  pedis, a deformed monophasic right dorsalis pedis, and right posterior  tibial.  I  cannot locate the left posterior tibial pulse.   IMPRESSION:  Lance Grant does appear to have a component of peripheral  vascular disease, but this is likely asymptomatic.  His current problems  are not consistent with arterial disease of the lower extremities.  Nonetheless, we will perform ABIs to quantify the severity of his  disease.  X-rays of the tibia, fibulas, knees, and ankles will be  obtained.  Lance Grant will use Tylenol on a p.r.n. basis, until I see  him again in a few weeks.  A chemistry profile will also be obtained  looking for evidence of the endocrine abnormalities.     Sincerely,      Gerrit Friends. Dietrich Pates, MD, [facility]  Electronically Signed    RMR/MedQ  DD: 06/16/2008  DT: 06/17/2008  Job #: [id number]   CC:    Dorise Hiss, M.D.

## 2011-03-19 NOTE — Letter (Signed)
January 29, 2008    Lance Grant, M.D.  [address]  [place], [place] [address]   RE:  Lance Grant, Lance Grant  MRN:  [id number]  /  DOB:  30-Oct-1921   Dear Dr. Anne Hahn,   Lance Grant returns to the office for continued assessment and  treatment of coronary disease.  Since I last saw him, five months ago,  he has interrupted his exercise routine.  He was once told that he  should not go out in cold weather, so he does not go to the gym and  becomes relatively inactive during the winter.  He has had no dyspnea  nor chest discomfort.  He notes no light-headedness and no syncope.  He  continues to take warfarin with stable and therapeutic anticoagulation  and no apparent adverse effects.   CURRENT MEDICATIONS INCLUDE:  1. Simvastatin 40 mg daily.  2. Atenolol 25 mg daily.  3. Aspirin 81 mg daily.  4. Warfarin as directed.  5. Atacand 16 mg daily.   ON EXAM:  A pleasant gentleman, in no acute distress.  The weight is 210 pounds, 4 pounds more than in October of last year.  Blood pressure 115/70, heart rate 60 and irregular, respirations 18.  NECK:  No jugular venous distention, normal carotid upstrokes without  bruits.  LUNGS:  Clear.  CARDIAC:  Normal first and second heart sounds.  Fourth heart sound and  modest systolic ejection murmur.  ABDOMEN:  Soft and nontender; no masses, no organomegaly.  EXTREMITIES:  Trace edema.  Distal pulses intact.   RHYTHM STRIP:  Sinus rhythm with sinus arrhythmia.   IMPRESSION:  Lance Grant is doing well.  His INR is 2.8 today, well  within the therapeutic range.  We will continue to monitor this in  Coumadin clinic.  Due to chronic anticoagulation, a stool for Hemoccult  testing will be obtained.  He reports recent labs at [facility] - we will  attempt to locate these.  If a lipid profile has not been obtained, one  will be performed.  He has now been taking Coumadin for nearly two years  for an unprovoked pulmonary embolism.  I would be  inclined to continue  Coumadin as long as he continues to do well with this medication.    Sincerely,      Gerrit Friends. Dietrich Pates, MD, [facility]  Electronically Signed    RMR/MedQ  DD: 01/29/2008  DT: 01/29/2008  Job #: [id number]

## 2011-03-19 NOTE — Letter (Signed)
Mar 25, 2007    Dorise Hiss, M.D.  [address]  [place], [place] [address]   RE:  Lance Grant, Lance Grant  MRN:  [id number]  /  DOB:  Dec 01, 1920   Dear Rosanne Ashing:   Lance Grant is seen in the office today at his request for right leg  pain.  Unfortunately, this event occurred approximately a month ago.  Since he was only offered an appointment 4 weeks in the future, he  elected to see Dr. Hilda Lias.  A venous ultrasound study was performed and  was negative.  X-rays were apparently non-revealing.  Lance Grant's  symptoms have resolved, but he elected to keep his appointment anyway.   Since his last visit, he has done fine.  He remains asymptomatic.  He  continues to do well on Coumadin.   MEDICATIONS:  Unchanged.  Anticoagulation has been stable and  therapeutic.   EXAM:  Pleasant well-appearing gentleman.  Weight is 201, 6 pounds less than in February.  Blood pressure 130/75,  heart rate 66 and regular, respirations 16.  NECK:  No jugular venous distension.  LUNGS:  Clear.  CARDIAC:  Modest systolic murmur.  ABDOMEN:  Soft and nontender; no organomegaly.  EXTREMITIES:  Trace edema; no tenderness.   Stool samples negative x3 over the past few months.  CBC was normal.   IMPRESSION:  Lance Grant continues to do well.  Since his last  echocardiogram showed severe pulmonary hypertension, this study will be  repeated to verify that that condition has resolved.  I will plan to see  him again as previously scheduled later this year.    Sincerely,      Gerrit Friends. Dietrich Pates, MD, [facility]  Electronically Signed    RMR/MedQ  DD: 03/25/2007  DT: 03/25/2007  Job #: [id number]   CC:    Teola Bradley, M.D.

## 2011-03-19 NOTE — Procedures (Signed)
NAME:  Lance Grant, Lance Grant NO.:  [id number]   MEDICAL RECORD NO.:  [id number]          PATIENT TYPE:  OUT   LOCATION:  RAD                           FACILITY:  [facility]   PHYSICIAN:  Pricilla Riffle, MD, FACCDATE OF BIRTH:  1921/05/20   DATE OF PROCEDURE:  03/27/2007  DATE OF DISCHARGE:                                ECHOCARDIOGRAM   TEST INDICATIONS:  The patient is a gentleman with CAD, pulmonary  hypertension, rule out a history of pulmonary embolism.   A 2-D echo with echo Doppler.  Left ventricle is normal in size with an  end-diastolic dimension of 45-mm.  The interventricular septum is mildly  thickened at 13-mm.  Posterior wall is normal at 8-mm.   Left atrium is normal at 35-mm.  Right atrium and right ventricle are  normal.  Aortic root is normal at 33-mm.   The aortic valve is mildly thickened, not stenotic.  There is no  insufficiency.   Mitral valve is mildly thickened.  No stenosis.  Trivial insufficiency.   Pulmonic valve is normal.  Tricuspid valve is normal with no  insufficiency.   Overall LV systolic function is normal with an LVEF of approximately  65%.  RV EF is normal.   No pericardial effusion is seen.  IVC is normal.  The aortic arch is  normal.      Pricilla Riffle, MD, [facility]  Electronically Signed     PVR/MEDQ  D:  03/27/2007  T:  03/27/2007  Job:  [id number]

## 2011-03-19 NOTE — Letter (Signed)
July 07, 2008    Dorise Hiss, M.D.  [address]  [place], [place] [address]   RE:  Lance Grant, Lance Grant  MRN:  [id number]  /  DOB:  1921-06-21   Dear Lance Grant,   Lance Grant returns to the office for continuing bilateral leg  discomfort.  He now describes primarily knee pain that resolves when  heating pads are applied in the morning for 5 minutes.  He has been  using Tylenol at the rate of approximately 1 tablet b.i.d. with good  relief of symptoms.   He had x-rays of the long bones in his leg and the ankles - no  significant abnormalities were noted other than prior surgery on the  right.  Scattered vascular calcifications were seen.  ABIs were assessed  and were unremarkable.  There was increased pressure at the thigh  consistent with vascular calcification.   PHYSICAL EXAMINATION:  GENERAL:  Pleasant gentleman, in no acute  distress.  VITAL SIGNS:  The weight is 206, 5 pounds less than 3 weeks ago.  Blood  pressure 130/80, heart rate 68 and regular, respirations 14.  KNEES:  No effusion; full range of motion without pain.   IMPRESSION:  Lance Grant's symptoms are not at all consistent with  claudication.  There is no evidence on ankle-brachial indexes for focal  disease.  If he were having suggest of symptoms, an imaging study,  either by MRI or CT would be required; however, I do not think  this is necessary.  I will obtain a rheumatoid factor and antinuclear  antibody as well as knee films and direct Lance Grant to Dr. Hilda Lias  for reassessment.  I will see him again in the office for his cardiac  issues as previously planned.    Sincerely,      Gerrit Friends. Dietrich Pates, MD, [facility]  Electronically Signed    RMR/MedQ  DD: 07/07/2008  DT: 07/08/2008  Job #: [id number]   CC:    Teola Bradley, M.D.

## 2011-03-22 NOTE — Discharge Summary (Signed)
NAME:  Lance Grant, Lance Grant NO.:  [id number]   MEDICAL RECORD NO.:  [id number]          PATIENT TYPE:  INP   LOCATION:  2021                         FACILITY:  [facility]   PHYSICIAN:  [place] Bing, M.D. [facility] OF BIRTH:  [birth year]   DATE OF ADMISSION:  02/24/2006  DATE OF DISCHARGE:  03/02/2006                                 DISCHARGE SUMMARY   ADDENDUM:  Total physician and P.A. time on this discharge greater than 30  minutes.      Tereso Newcomer, P.A.      [place] Bing, M.D. [facility]  Electronically Signed    SW/MEDQ  D:  03/02/2006  T:  03/03/2006  Job:  [id number]

## 2011-03-22 NOTE — Discharge Summary (Signed)
NAME:  Lance Grant, Lance Grant NO.:  [id number]   MEDICAL RECORD NO.:  [id number]          PATIENT TYPE:  INP   LOCATION:  2021                         FACILITY:  [facility]   PHYSICIAN:  Willa Rough, M.D.     DATE OF BIRTH:  [birth year]   DATE OF ADMISSION:  02/24/2006  DATE OF DISCHARGE:  03/02/2006                                 DISCHARGE SUMMARY   CARDIOLOGIST:  [place] Bing, M.D. [facility].   PRIMARY CARE PHYSICIAN:  Dorise Hiss, M.D.   REASON FOR ADMISSION:  Pulmonary embolism.   DISCHARGE DIAGNOSES:  1.  Pulmonary embolism.  2.  Left lower extremity deep venous thrombosis.  3.  Chronic renal insufficiency.  4.  Moderate obstructive airway disease by pulmonary function tests this      admission.  5.  Coronary artery disease, status post coronary artery bypass graft.  6.  Treated dyslipidemia.  7.  History of chronic obstructive pulmonary disease.   HISTORY:  [name] is an 77 year old male patient followed by Dr.  Dietrich Pates who was evaluated for acutely increased dyspnea on exertion.  D-  dimer was elevated, and he was sent for a V/Q scan.  This was positive for  high likelihood of pulmonary embolism.  He was admitted for further  anticoagulation.   HOSPITAL COURSE:  As noted above, the patient was admitted for  anticoagulation.  He was placed on heparin and Coumadin.  Once his INR was  therapeutic, he was continued on heparin for a 3-day overlap.  He underwent  venous Dopplers that showed left lower extremity DVT with a short segment of  thrombus in the posterior wall of the proximal femoral vein.  He also  underwent PFTs.  This showed moderate obstructive airway disease as well as  moderately severe restriction and moderately severe diffusion defect.   On March 02, 2006, Dr. Andee Lineman saw the patient and felt he was ready for  discharge to home.  He will remain on Coumadin 6 mg a day and have close  followup in our office next week, with the Coumadin clinic  in the next  couple weeks, and with Dr. Dietrich Pates.   LABORATORIES:  At discharge, white count 4700, hemoglobin 11.5, hematocrit  33.3, platelet count 196,000.  INR therapeutic on February 28, 2006 at 2, March 01, 2006 at 2.3, March 02, 2006 at 2.5.  On admission, sodium 137, potassium  4.1, glucose 116, BUN 29, creatinine 2.  LFTs okay.  Total protein 6,  albumin 3.  Chest x-ray on February 24, 2006:  No acute abnormality.  Scarring  at the lung bases with COPD.   PROCEDURES PERFORMED THIS ADMISSION:  Pulmonary function tests.   DISCHARGE MEDICATIONS:  1.  Coumadin 6 mg daily.  2.  Zocor 40 mg at bedtime.  3.  Claritin 10 mg daily.  4.  Atenolol 25 mg daily.  5.  Diovan/HCT 160/12.5 mg daily.  6.  Aspirin 81 mg daily.   DIET:  Lowfat, low sodium.   ACTIVITY:  Increase activity slowly.   FOLLOWUP:  The patient will be contacted by someone from  our office for  followup in the Coumadin clinic in [place] next week, and then he will  need a follow-up appointment with Dr. Dietrich Pates in the next 1-2 weeks.  The  office should contact him with an appointment.      Tereso Newcomer, P.A.    ______________________________  Willa Rough, M.D.    SW/MEDQ  D:  03/02/2006  T:  03/03/2006  Job:  [id number]   cc:   Dorise Hiss, M.D.  Fax: [phone]

## 2011-03-22 NOTE — Procedures (Signed)
[facility]  Patient:    GRAYSEN, WOODYARD Visit Number: [id number] MRN: [id number]          Service Type: OUT Location: RAD Attending Physician:  Cain Sieve Dictated by:   Delton See, P.A. Proc. Date: 09/25/01 Admit Date:  09/25/2001   CC:         Dorise Hiss, M.D.   Stress Test  DATE OF BIRTH:  14-Jul-1921  BRIEF HISTORY:  Mr. Falletta is a pleasant 77 year old male who underwent PTCA stenting of the LAD in July of 2002.  He recently had a restenosis and was admitted to the hospital, August 17, 2001, at which time he underwent a cutting balloon procedure on the LAD stent stenosis, reducing it from 95% to approximately 10%.  Close monitoring was recommended with plans for brachytherapy if he has further restenosis.  Other history is significant for hypertension, gastroesophageal reflux disease and remote tobacco.  The patient reports no recent chest pain.  His baseline EKG showed sinus rhythm, rate 52 beats per minute, with biphasic T wave, II, III, aVF and V5 through V6.  DESCRIPTION OF PROCEDURE:  The patients resting blood pressure was 130/72. Target heart rate was 120.  The patient was able to exercise for a total of 4 minutes and 42 seconds, reaching a maximal heart rate of 128 beats per minute.  He had occasional PVCs.  There were slight inferolateral ST changes. He had some mild chest tightness at peak exercise.  The EKG changes and these symptoms resolved in recovery.  His blood pressure was 198/75 in recovery, however, this improved as well.  The final images are pending at time of this dictation. Dictated by:   Delton See, P.A. Attending Physician:  Cain Sieve DD:  09/25/01 TD:  09/26/01 Job: [id number]

## 2011-03-22 NOTE — Procedures (Signed)
NAME:  Lance Grant, Lance Grant NO.:  [id number]   MEDICAL RECORD NO.:  [id number]          PATIENT TYPE:  REC   LOCATION:  RAD                           FACILITY:  [facility]   PHYSICIAN:  Edward L. Juanetta Gosling, M.D.DATE OF BIRTH:  Jun 25, 1921   DATE OF PROCEDURE:  02/25/2006  DATE OF DISCHARGE:                              PULMONARY FUNCTION TEST   PULMONARY FUNCTION TEST:  Spirometry shows a mild ventilatory defect with  evidence of airflow obstruction.  The lung volumes are normal.  DLCO is  mildly reduced.  Arterial blood gases show resting hypoxemia but normal pH.  There is no significant bronchodilator effect.      Edward L. Juanetta Gosling, M.D.  Electronically Signed     ELH/MEDQ  D:  02/25/2006  T:  02/26/2006  Job:  [id number]   cc:   Curtice Bing, M.D. [facility]  [address]  [address]  [place]  [place] [address]

## 2011-03-22 NOTE — Cardiovascular Report (Signed)
Lance Grant. [facility]  Patient:    Lance Grant, Lance Grant Visit Number: [id number] MRN: [id number]          Service Type: CAT Location: 2300 2310 01 Attending Physician:  Nelta Numbers Dictated by:   Everardo Beals Juanda Chance, M.D. [facility] Proc. Date: 10/15/01 Admit Date:  10/15/2001   CC:         Dorise Hiss, M.D., [facility]  Gerrit Friends. Dietrich Pates, M.D. [facility]  Luis Abed, M.D. [facility]   Cardiac Catheterization  INDICATIONS:  [name] is 77 years old and had previous stenting of the LAD in July of 2002 and in October developed diffuse in-stent restenosis treated with cutting balloon angioplasty.  We knew he was at high risk for restenosis and planned to bring him back early for a heart scan, but he developed recurrent chest pain which was proloned yesterday and was admitted today for evaluation.  His troponins were positive.  DESCRIPTION OF PROCEDURE:  The procedure was performed via the right femoral artery using arterial sheath and 6 French preformed coronary catheters. A front wall arterial puncture was performed and Omnipaque contrast was used. Subclavian injection was performed to assess internal mammary artery for suitability of bypass grafting.  Distal aortogram was performed to evaluate the patient for peripheral vascular disease.  The patient tolerated the procedure well and left the laboratory in satisfactory condition.  RESULTS:  The aortic pressure was 107/54 with a mean of 73.  Left ventricular pressure was 107/13.  The left main coronary artery is free of significant disease.  The left anterior descending artery gave rise to a moderate size diagonal branch and a septal perforator, second septal perforator, and second diagonal branch.  The stent was located in the proximal to mid LAD and was a 3.5 x 18 mm Nir stent and had a 99% in-stent restenosis with Timi 2 flow distally.  The stenosis extended about 10 mm outside the  stent where the narrowing was about 70 to 80%.  The circumflex artery gave rise to a large intermediate branch and an AV branch which terminated into a posterolateral branch.  There was 80% narrowing in the proximal portion of the intermediate branch.  The right coronary artery was a moderately large vessel that gave rise to a right ventricular branch, posterior descending branch, and two posterolateral branches.  There was irregularities in this vessel, but no major obstruction.  The left ventriculogram performed in RAO projection showed akinesis of the apex.  The anterior lateral wall and inferior wall moved vigorously. Estimated ejection fraction was 55%.  Distal aortogram was performed which showed patent renal arteries and no significant aortoiliac obstruction.  CONCLUSION: 1. Coronary artery disease, status post prior stenting of the LAD and prior    cutting balloon angioplasty for in-stent restenosis October of 2002, with    tight recurrent in-stent restenosis with 99% stenosis with Timi 2 flow    distally, 80% narrowing in the circumflex intermediate branch, no major    obstruction in the right coronary artery, and apical wall akinesis.  RECOMMENDATIONS:  I discussed the results with Dr. Laneta Simmers.  I think the patient is quite unstable and I think urgent surgery would be the best option. He will see him in consultation with plan for surgery later today. Dictated by:   Everardo Beals Juanda Chance, M.D. [facility] Attending Physician:  Nelta Numbers DD:  10/15/01 TD:  10/15/01 Job: [id number]

## 2011-03-22 NOTE — Op Note (Signed)
NAME:  Lance Grant, Lance Grant NO.:  [id number]   MEDICAL RECORD NO.:  [id number]          PATIENT TYPE:  AMB   LOCATION:  DAY                           FACILITY:  [facility]   PHYSICIAN:  Dennie Maizes, M.D.   DATE OF BIRTH:  December 27, 1920   DATE OF PROCEDURE:  07/24/2005  DATE OF DISCHARGE:                                 OPERATIVE REPORT   PREOPERATIVE DIAGNOSIS:  Bulbous urethral strictures, voiding difficulty.   POSTOPERATIVE DIAGNOSIS:  Bulbous urethral strictures, voiding difficulty.   OPERATIVE PROCEDURE:  Cystoscopy and visual internal urethrotomy.   ANESTHESIA:  General.   SURGEON:  Dr. Rito Ehrlich.   COMPLICATIONS:  None.   ESTIMATED BLOOD LOSS:  Minimal.   DRAINS:  18-French Silastic Foley catheter in the bladder.   INDICATIONS FOR PROCEDURE:  This 77 year old male had a history of urinary  tract infection. He also had voiding difficulty and split urinary stream.  Cystoscopy in the office revealed bulbous urethral stricture. The patient  was brought to the OR today for cystoscopy and visual internal urethrotomy.   DESCRIPTION OF PROCEDURE:  General anesthesia was induced, and the patient  was placed on the OR table in the dorsolithotomy position. The lower abdomen  and genitalia were prepped and draped in a sterile fashion. Cystoscopy was  done with a 22-French scope. There were multiple annular bulbous ureteral  strictures with obstruction. The scope could not be introduced into the  bladder. A 22-French visual urethrotome was then inserted into the urethra.  The strictures were incised at 12 o'clock position with the a cold knife.  This opened up the strictures nicely. The scope could be introduced into the  bladder without any difficulty. Moderate hypertrophy of both lateral lobes  of prostate with partial obstruction of bladder neck was noted. The bladder  was then examined and found to be  normal. When withdrawing the scope, the strictures were re-incised  and there  was no active bleeding. Cystoscope was removed. An 18-French Silastic Foley  catheter was inserted into the bladder and connected to urine bag. Estimated  blood loss was minimal. The patient was transferred to the PACU in a  satisfactory condition.      Dennie Maizes, M.D.  Electronically Signed     SK/MEDQ  D:  07/24/2005  T:  07/24/2005  Job:  [id number]   cc:   Dorise Hiss, M.D.  Fax: [phone]

## 2011-03-22 NOTE — H&P (Signed)
[place]. [facility]  Patient:    Lance Grant, Lance Grant Visit Number: [id number] MRN: [id number]          Service Type: CAT Location: [facility] 2899 18 Attending Physician:  Nelta Numbers Dictated by:   Gerrit Friends. Dietrich Pates, M.D. Admit Date:  10/15/2001                           History and Physical  HISTORY OF PRESENT ILLNESS:  This is a 77 year old gentleman with initial PTCA of critical LAD lesion in 7/02.  He developed in-stent restenosis in October treated with a cutting balloon.  He now returns with resting and exertional chest tightness without associated dyspnea.  This is mid-substernal, mild to moderate in severity with radiation to the left shoulder.  There is immediate relief with nitroglycerin but some episodes fade within seconds to minutes. These are very similar symptoms to his admission restenosis-they were completely relieved by the percutaneous intervention performed during that admission.  PAST MEDICAL HISTORY:  Notable for hypertension.  There is also a history of gastroesophageal reflux disease.  The patient developed asymptomatic sinus bradycardia on metoprolol 25 mg q.d. prompting discontinuation of that drug.  FAMILY HISTORY:  Negative for coronary artery disease.  SOCIAL HISTORY:  Married; remote tobacco use, no significant use of alcohol. The patient remains active including recent travel to [facility].  ALLERGIES:  ERYTHROMYCIN.  CURRENT MEDICATIONS: 1. Cardura 4 mg q.d. 2. Enteric coated aspirin 325 mg q.d. 3. Ramipril 10 mg q.d. 4. Foltx.  REVIEW OF SYSTEMS:  Occasional constipation and heartburn, stable weight and appetite; otherwise all systems negative.  PHYSICAL EXAMINATION:  GENERAL:  This is a well-appearing gentleman in no acute distress.  VITAL SIGNS:  Weight is 207, six pounds less than in 10/02.  Blood pressure 140/86, heart rate 95 and regular, respirations-16.  HEENT:  Normal EOMs, normal oral  mucosa.  NECK:  No jugular venous distention, no bruits.  ENDOCRINE:  No thyromegaly.  SKIN:  No significant lesions.  LUNGS:  Clear.  CARDIAC:  Normal first and second heart sounds; fourth heart sound present.  ABDOMEN:  Benign without organomegaly, aortic pulsation not appreciated.  EXTREMITIES:  Distal pulses intact, no edema.  NEUROLOGIC:  Normal strength and tone.  EKG:  Normal sinus rhythm; right atrial enlargement; nonspecific ST-T-wave abnormalities; no change when compared to a prior tracing of 09/08/01.  IMPRESSION:  [name] presents with recurrent symptoms that are likely related to myocardial ischemia.  He was aware of the high risk of recurrence after his last percutaneous intervention.  At that time, the plan was for brachytherapy if there was another recurrent stenosis in the LAD stent.  Mr. Siever will be given metoprolol and advised to proceed directly to [facility] for admission.  Cardiac catheterization will be undertaken with further therapy guided by the results of that test.  He will start on low molecular weight heparin as soon as he is admitted to [facility] with nitroglycerin added if he has recurrent pain. Dictated by:   Gerrit Friends. Dietrich Pates, M.D. Attending Physician:  Nelta Numbers DD:  10/15/01 TD:  10/15/01 Job: [phone] [id number]

## 2011-03-22 NOTE — H&P (Signed)
NAMEMarland Grant  AGUSTIN, SWATEK NO.:  [id number]   MEDICAL RECORD NO.:  [id number]          PATIENT TYPE:  INP   LOCATION:  2021                         FACILITY:  [facility]   PHYSICIAN:  Flasher Bing, M.D. [facility] OF BIRTH:  11-Jul-1921   DATE OF ADMISSION:  02/24/2006  DATE OF DISCHARGE:                                HISTORY & PHYSICAL   REFERRING:  Dorise Hiss, M.D.   PRIMARY CARDIOLOGIST:  Kanab Bing, M.D.   HISTORY OF PRESENT ILLNESS:  An 77 year old gentleman with known coronary  disease and chronic lung disease, recently evaluated in the office for  acutely increased dyspnea on exertion. The patient reports impaired exercise  capacity. There has been no orthopnea, no PND. He has not noted any weight  loss. He has had no chest discomfort. There has been no ankle edema. He has  had a mildly productive cough. He reports no recent acute illnesses or  symptoms consistent with a URI.   Initial evaluation included a chest x-ray and echocardiogram. There were  changes thought to be indicative of mild COPD on his x-ray. His  echocardiogram revealed right heart enlargement and dysfunction with a  normal left ventricle and normal valves. His EKG shows deep T-wave  inversions and ST-segment depression in the inferior leads that were  markedly increased compared with prior tracings. T-waves were also inverted  in V2-V6. A BNP level was mildly elevated. A D-dimer level was markedly  elevated. A VQ scan was performed due to chronic renal insufficiency with  creatinine at baseline of approximately 2.0 and indicated a high likelihood  of pulmonary embolism. He is now admitted for anticoagulation.   Cardiac history dates back to 2002 when the patient presented with  myocardial infarction and required percutaneous intervention. He  subsequently underwent CABG surgery that year. He has done well from a  cardiac standpoint since.   RECENT MEDICATIONS:  1.  Diovan HCT  160/12.5 milligrams daily.  2.  Simvastatin 40 milligrams daily.  3.  Loratadine 10 milligrams daily.  4.  Atenolol 25 milligrams daily.  5.  Aspirin 325 milligrams daily.   ALLERGIES:  He reports an allergy to be ERYTHROMYCIN and SIMILAR  ANTIBIOTICS.   SOCIAL HISTORY:  Retired and lives in [place] with his wife; one daughter  and one son.  Approximately a 40 pack year history of cigarette smoking that  was discontinued in 1989. No excessive use of alcohol.   FAMILY HISTORY:  Notable for hypertension; not much in the way of vascular  disease.   REVIEW OF SYSTEMS:  Notable for occasional headaches, rare episodes of  wheezing, intermittent lightheadedness. All other systems reviewed and are  negative.   PHYSICAL EXAMINATION:  GENERAL:  A very pleasant well-appearing gentleman.  VITAL SIGNS:  Temperature is 96.8, heart rate 72 and regular, respirations  20, blood pressure 100/68.  HEENT:  Bilateral arcus; pupils equal, round, react to light; anicteric  sclerae; normal oral mucosa; normal EOMs.  NECK:  Minimal jugular venous distension; normal carotid upstrokes without  bruits.  LUNGS:  A few expiratory rhonchi.  CARDIAC:  Normal first  and second heart sounds; fourth heart sound present.  ABDOMEN:  Soft and nontender; no masses; no organomegaly.  EXTREMITIES:  Trace edema; distal pulses intact.   IMPRESSION:  Mr. Lance Grant has a clinical history that is consistent with  pulmonary embolism. Right heart enlargement and dysfunction could support  that diagnosis, although one would not expect RVH with an acute process. The  lung scan should establish the diagnosis with a 90+ percent probability in  this setting. We will proceed with anticoagulation with intravenous heparin  and warfarin. An overlap of 3 days is planned. To further document the  diagnosis, ultrasound of the lower extremities will be performed, as well as  a room air arterial blood gas and pulmonary function tests.  Consideration  will be given to a CAT scan.  The risk of significant renal insufficiency is  relatively small.      Moraga Bing, M.D. [facility]  Electronically Signed     RR/MEDQ  D:  02/24/2006  T:  02/24/2006  Job:  [id number]

## 2011-03-22 NOTE — Discharge Summary (Signed)
[place]. [facility]  Patient:    Lance Grant, Lance Grant Visit Number: [id number] MRN: [id number]          Service Type: CAT Location: 2000 2038 01 Attending Physician:  Cleatrice Burke Dictated by:   Adair Patter, P.A. Admit Date:  10/15/2001 Disc. Date: 10/18/01   CC:         Gerrit Friends. Dietrich Pates, M.D. [facility]   Discharge Summary  DATE OF BIRTH:  1921/01/30  ADMITTING DIAGNOSES: 1. Coronary artery disease. 2. Hypertension. 3. Gastroesophageal reflux disease.  DISCHARGE DIAGNOSIS:  Coronary artery disease.  HOSPITAL COURSE:  [name] was admitted to [facility] on October 15, 2001, secondary to chest pain.  Because he was having this chest pain, on the date of admission he underwent cardiac catheterization which revealed the patient had significant coronary artery disease requiring surgical intervention.  Because of this problem, Dr. Laneta Simmers was consulted and performed coronary artery bypass graft x 2 emergently with the left internal mammary artery anastomosed to the left anterior descending artery and the saphenous vein graft to the intermediate artery.  No complications were noted during this procedure.  Postoperatively, the patient progressed well.  He was transferred out of the ICU on postop day #1.  He began ambulating well and was off all oxygen requirements by postop day #3.  He was subsequently discharged home in stable condition on October 18, 2001.  DISCHARGE MEDICATIONS: 1. Tylox one to two tablets every four to six hours as needed for pain. 2. Aspirin 325 mg one daily.  ACTIVITY:  The patient was told to avoid, driving, strenuous activity and lifting heavy objects.  He was old to walk daily and continue to use his incentive spirometer.  DIET:  Low fat, low salt diet.  WOUND CARE:  The patient was told he could shower and clean his incision with soap and water.  DISPOSITION:  Discharged to home.  FOLLOWUP:  The  patient was told to see his cardiologist, Dr. Dietrich Pates, in two weeks.  He was told to bring x-rays to Dr. Garen Grams office with him.  He was also told to see Dr. Laneta Simmers in the CVTS office in three weeks.  He will also call and verify time and date of this appointment. Dictated by:   Adair Patter, P.A. Attending Physician:  Cleatrice Burke DD:  10/18/01 TD:  10/18/01 Job: [id number]

## 2011-03-22 NOTE — Op Note (Signed)
[place]. [facility]  Patient:    SEMIR, BRILL Visit Number: [id number] MRN: [id number]          Service Type: CAT Location: 2000 2038 01 Attending Physician:  Cleatrice Burke Dictated by:   Alleen Borne, M.D. Proc. Date: 10/16/01 Admit Date:  10/15/2001   CC:         Bruce R. Juanda Chance, M.D. [facility]  CVTS office  Cath lab   Operative Report  PREOPERATIVE DIAGNOSIS:  High grade in-stent restenosis of the left anterior descending with unstable angina and acute myocardial infarction.  POSTOPERATIVE DIAGNOSIS:  High grade in-stent restenosis of the left anterior descending with unstable angina and acute myocardial infarction.  OPERATION PERFORMED:  Emergency median sternotomy, extracorporeal circulation, coronary artery bypass graft surgery x 2 using a left internal mammary artery graft to the left anterior descending coronary artery, and a saphenous vein graft to the intermediate coronary artery.  SURGEON:  Alleen Borne, M.D.  ASSISTANT:  Adair Patter, P.A.  ANESTHESIA:  General endotracheal.  INDICATIONS FOR PROCEDURE:  The patient is a 77 year old gentleman with a history of coronary artery disease, status post angioplasty and stenting of the LAD in July of 2002.  He subsequently developed restenosis in August of 2002 and underwent percutaneous intervention with a cutting balloon.  The patient now returns with recurrent unstable angina and rule in for myocardial infarction.  Cardiac catheterization showed high grade in-stent restenosis of the proximal LAD.  There is also about 80% stenosis in the proximal portion of the large intermediate vessel that supposed most of the lateral wall.  Left ventricular function was fairly well preserved with an ejection fraction 55%. After review of the angiograms and examination of the patient it was felt that coronary artery bypass surgery was the best treatment.  The patient had ongoing chest pain  and was therefore taken to the operating room emergently.  DESCRIPTION OF PROCEDURE:  The patient was taken to the operating room and placed on the table in supine position.  After induction of general endotracheal anesthesia, a Foley catheter was placed in the bladder using sterile technique.  Then the chest, abdomen and both lower extremities were prepped and draped in the usual sterile manner.  The chest was entered through a median sternotomy incision and the pericardium opened in the midline. Examination of the heart showed good ventricular contractility.  The ascending aorta had no palpable plaques in it.  Then the left internal mammary artery was harvested from the chest wall as a pedicle graft.  This was a medium caliber vessel with excellent blood flow through it.  At the same time a segment of the greater saphenous vein was harvested from the left lower leg and this vein was of medium size and good quality.  The saphenous vein was initially examined in the right lower leg but was somewhat smaller than ideal and somewhat thickened and would not dilate.  Then the patient was heparinized and when an adequate activated clotting time was achieved, the distal ascending aorta was cannulated using a 20 French aortic cannula for arterial inflow.  Venous outflow was achieved using a two-stage venous cannula through the right atrial appendage.  An antegrade cardioplegia and vent cannula was inserted into the aortic root.  The patient was placed on cardiopulmonary bypass and the distal coronary arteries were identified.  The LAD was a medium-sized graftable vessel and was heavily diseased in its proximal portion.  The intermediate was also a large  vessel and had proximal disease.  Then the aorta was cross-clamped and 500 cc of cold blood antegrade cardioplegia was administered in the aortic root with quick arrest of the heart.  Systemic hypothermia to 20 degrees centigrade and topical  hypothermia with iced saline was used.  A temperature probe was placed in the septum and an insulating pad in the pericardium.  The first distal anastomosis was performed to the intermediate coronary artery.  The internal diameter was about 2.5 mm.  The conduit used was a segment of greater saphenous vein.  Anastomosis was performed in end-to-side manner using continuous 7-0 Prolene suture.  The flow was measured through the graft and was excellent.  Another dose of cardioplegia was given down the vein graft.  The second distal anastomosis was performed to the midportion of the LAD artery.  The internal diameter was 2.5 mm.  The conduit used was a the left internal mammary artery and this was brought through an opening in the left pericardium anterior to the phrenic nerve.  It was anastomosed to the LAD in end-to-side manner using continuous 8-0 Prolene suture.  The pedicle was tacked to the epicardium with 6-0 Prolene sutures.  The patient was rewarmed to 37 degrees centigrade and the clamp removed from the mammary pedicle. There was rapid warming of the ventricular septum and return of spontaneous ventricular fibrillation.  The crossclamp was removed with a time of of 30 minutes and the patient defibrillated into sinus rhythm.  A proximal occlusion clamp was placed on the aortic root and the single proximal vein graft anastomosis was performed in end-to-side using continuous 6-0 Prolene suture.  The clamps were removed, the vein grafts deaired and the clamps removed from them.  The proximal and distal anastomoses appeared hemostatic and the line of the grafts satisfactory.  Graft markers were placed around the proximal anastomoses.  Two temporary right ventricular and right atrial pacing wires were placed and brought out through the skin.   When the patient had rewarmed to 37 degrees centigrade, he was weaned from cardiopulmonary bypass on no inotropic agents.  Total bypass time was  57 minutes.  Cardiac function appeared excellent with a cardiac output of 7L per minute.  Protamine was given and the venous and aortic cannulas were removed without difficulty.  Hemostasis was achieved.  Three chest tubes were placed with a tube in the posterior pericardium and one in the left pleural space and one in the anterior mediastinum.  The pericardium was reapproximated over the heart.  The sternum was closed with #6 stainless steel wires.  The fascia was closed with continuous #1 Vicryl suture.  The subcutaneous tissues were closed using continuous 2-0 Vicryl and the skin with 3-0 Vicryl subcuticular closure. The lower extremity vein harvest site was closed in layers in a similar manner.  The sponge, needle and instrument counts were correct according to the scrub nurse.  A dry sterile dressing was applied over the incisions and around the chest tubes which were hooked to Pleur-evac suction.  The patient remained hemodynamically stable and was transported to the SICU in guarded but stable condition. Dictated by:   Alleen Borne, M.D. Attending Physician:  Cleatrice Burke DD:  10/16/01 TD:  10/16/01 Job: [id number]

## 2011-03-22 NOTE — Letter (Signed)
December 26, 2006    Dorise Hiss, M.D.  [address]  [place], [place] [address]   RE:  Lance Grant, Lance Grant  MRN:  [id number]  /  DOB:  [birth year]   Dear Rosanne Ashing:   Lance Grant returns to the office for continued assessment and  treatment of coronary disease, now 5 years following CABG surgery and  pulmonary embolism, which was documented in May of 2007.  He has done  well since he was last seen nearly 1 year ago, with no dyspnea and no  chest discomfort.  He has occasional discomfort in his legs at night  that is relieved by arising and exercising.  His INRs have been stable  and therapeutic on warfarin.   His other medications include:  1. Atacand one daily.  2. Aspirin 81 mg daily.  3. Atenolol 25 mg daily.  4. Simvastatin 40 mg daily.   On exam, a very robust-appearing older gentleman in no acute distress.  The weight is 207, blood pressure 140/85, heart rate 60 and regular,  respirations 16, O2 saturation on room air 95%.  HEENT:  Anicteric sclera.  NECK:  No jugular venous distention; no carotid bruits.  LUNGS:  Clear.  CARDIAC:  Normal first and second heart sounds; fourth heart sound  present.  ABDOMEN:  Soft and nontender; no organomegaly.  EXTREMITIES:  Ankle edema 1.5+.   IMPRESSION:  Lance Grant is doing well.  He no longer requires home  oxygen.  We will obtain a CBC and stool for hemoccult testing to monitor  chronic anticoagulation and continue to adjust warfarin dosage.  I will  see this nice gentleman again in 8 months.    Sincerely,      Gerrit Friends. Dietrich Pates, MD, [facility]  Electronically Signed    RMR/MedQ  DD: 12/26/2006  DT: 12/26/2006  Job #: [id number]

## 2011-03-22 NOTE — Procedures (Signed)
NAME:  Lance Grant, SPLITT NO.:  [id number]   MEDICAL RECORD NO.:  [id number]          PATIENT TYPE:  OUT   LOCATION:  RAD                           FACILITY:  [facility]   PHYSICIAN:  Vida Roller, M.D.   DATE OF BIRTH:  July 10, 1921   DATE OF PROCEDURE:  02/18/2006  DATE OF DISCHARGE:                                  ECHOCARDIOGRAM   TAPE NUMBER:  [id number].   TAPE COUNT:  [id number] through 4218.   HISTORY:  This is an 77 year old man with dyspnea.  Technical quality of the  study was adequate.   M-MODE TRACINGS:  The aorta is 33 mm.   Left atrium is 44 mm.   Septum is 14 mm.   Posterior wall is 13 mm.   Left ventricular diastolic dimension is 42 mm.   Left ventricular systolic dimension is 22 mm.   2-D AND DOPPLER IMAGING:  The left ventricle is normal size.  There is  vigorous left ventricular systolic function with an ejection fraction  greater than 75%.  There is no obvious wall motion abnormality.  The  ventricle is in a D shape with evidence of right ventricular pressure  overload.  There is mild concentric left ventricular hypertrophy.   The right ventricle is mildly dilated.  There is evidence of free wall  hypertrophy.  The right ventricular systolic pressure appears to be down  somewhat.   Both atria are enlarged, right greater than left.   The aortic valve is sclerotic with no stenosis or regurgitation.   The mitral valve is without stenosis or regurgitation.   The tricuspid valve has mild to moderate regurgitation.  Unfortunately, no  assessment of velocity was obtained, so assessment of RV systolic pressure  is limited.   There is no obvious pericardial effusion.      Vida Roller, M.D.  Electronically Signed     JH/MEDQ  D:  02/18/2006  T:  02/19/2006  Job:  [id number]   cc:   Dorise Hiss, M.D.  Fax: [phone]

## 2011-03-22 NOTE — Cardiovascular Report (Signed)
[place]. [facility]  Patient:    Lance Grant, Lance Grant Visit Number: [id number] MRN: [id number]          Service Type: MED Location: [phone] Attending Physician:  Junious Silk Dictated by:   Everardo Beals Juanda Chance, M.D. [facility] [place] Proc. Date: 08/17/01 Admit Date:  08/14/2001   CC:         Dorise Hiss, M.D., [facility]  Gerrit Friends. Dietrich Pates, M.D. [facility]  Luis Abed, M.D. [facility]   Cardiac Catheterization  CLINICAL HISTORY:  Mr. Lance Grant is 77 years old and recently developed symptoms of chest pain and underwent stenting of the LAD on May 13, 2001. Over the past week, he has developed recurrent symptoms and was admitted to the hospital a few days ago and scheduled for angiography today.  PROCEDURE: 1. Cardiac catheterization. 2. Percutaneous coronary intervention on left anterior descending artery.  CARDIOLOGISTEverardo Beals Juanda Chance, M.D. [facility]  PROCEDURE IN DETAIL:  The procedure was performed via the right femoral artery using a arterial sheath and 6-French preformed coronary catheters.  A frontal arterial punch was performed, and Omnipaque contrast was used.  At the completion of diagnostic study, I made decision to proceed with intervention on the LAD.  The patient was given weight-adjusted heparin that prolonged the ACT to greater than 200 seconds and was given double bolus Integrilin and infusion. I used a 7-French JL4 guiding catheter and a short Luge wire.  We crossed the tight in-stent restenosis in the proximal LAD with the wire without difficulty.  We used a 3.0 x 15 mm cutting balloon and performed multiple inflations within the stent, distal to the stent, and proximal to the stent, going up to 10 atmospheres for 30 seconds.  We then upgraded to a 3.25 cutting balloon and performed multiple inflations within the stent, proximal to the stent, and finally distal to the stent up to 12 atmospheres within the stent for 30  seconds.  Repeat diagnostic study was then performed through the guiding catheter.  The patient tolerated the procedure well and left the laboratory in satisfactory condition.  RESULTS:  The aortic pressure was 123/59 with a mean of 81.  The left ventricular pressure was 123/11.  Left main coronary artery was free of significant disease.  Left anterior descending artery gave rise to three diagonal branches and two septal perforators.  There was 95% stenosis within the stent in the proximal LAD with extension of narrowing proximal and distal to the stent.  The left circumflex artery gave rise to an intermediate branch and an A-V branch which terminated into posterolateral branches.  There was 60-70% narrowing in the proximal portion of the intermediate branch.  The right coronary artery was a moderately large vessel that gave rise to a right ventricular branch, a posterior descending branch, and two posterolateral branch.  There were tandem 30% stenoses in the mid vessel.  The left ventriculogram performed in the RAO projection showed hypokinesis of the inferoapical segment.  The rest of the wall motion was good, and the estimated ejection fraction of 60%.  Following cutting balloon angioplasty for the in-stent resenosis in the proximal LAD, the stenosis improved from 90% to 10%.  CONCLUSION: 1. Coronary artery disease status post stenting of the proximal left anterior    descending artery July 2002 with 95% in-stent restenosis in the proximal    left anterior descending artery, 60-70% stenosis in the intermediate branch    of the circumflex artery,  30% narrowing in the mid right coronary artery,    and inferoapical wall hypokinesis. 2. Successful cutting balloon angioplasty of the in-stent restenosis in the    proximal left anterior descending artery with improvement in the stent    narrowing from 95% to 10%.  DISPOSITION:  The patient was taken to postanesthesia for further  observation. The patient will be at high risk for recurrence due to the fact that he had in-stent restenosis and a long area of disease.  Should he have recurrence, brachytherapy should be used if feasible.  I would recommend a followup Cardiolite scan in two to three months because of the very high risk of recurrence.  This will allow [place] to treat the lesion while the anatomy is still favorable and will give [place] time to electively set up for brachytherapy if we think that is the preferred treatment.  We will also treat the patient with Foltx to help reduce the risk of restenosis. Dictated by:   Everardo Beals Juanda Chance, M.D. [facility] Attending Physician:  Junious Silk DD:  08/17/01 TD:  08/17/01 Job: [id number]

## 2011-03-22 NOTE — Discharge Summary (Signed)
NAME:  Lance Grant, Lance Grant NO.:  [id number]   MEDICAL RECORD NO.:  [id number]          PATIENT TYPE:  INP   LOCATION:  2021                         FACILITY:  [facility]   PHYSICIAN:  Willa Rough, M.D.     DATE OF BIRTH:  [birth year]   DATE OF ADMISSION:  02/24/2006  DATE OF DISCHARGE:  03/02/2006                                 DISCHARGE SUMMARY   PRIMARY CARDIOLOGIST:  [place] Bing, M.D. [facility]   PRIMARY CARE PHYSICIAN:  Dorise Hiss, M.D.   REASON FOR ADMISSION:  Pulmonary embolism.   DISCHARGE DIAGNOSES:  1.  Pulmonary embolism.  2.  Left lower extremity deep venous thrombosis.  3.  Renal insufficiency with a creatinine of 2.  4.  Moderate obstructive lung disease by pulmonary function tests this      admission.  5.  Coronary artery disease, status post coronary artery bypass grafting.  6.  Treated dyslipidemia.  7.  History of chronic obstructive pulmonary disease.   HISTORY OF PRESENT ILLNESS:  Lance Grant is an 77 year old male patient  followed by Dr. Dietrich Pates in our [place] office who recently was evaluated  for dyspnea.  He had an elevated D-dimer and underwent a VQ scan secondary  to his chronic renal insufficiency.  This came back indicating a high  likelihood of pulmonary embolism.  He was admitted for further  anticoagulation.   HOSPITAL COURSE:  As noted above, the patient was admitted for  anticoagulation.  He remained on heparin and Coumadin.  His Coumadin was  continued for a three-day overlap after his INR was therapeutic.  He  underwent lower extremity Doppler which was significant for a short segment  of thrombus on the posterior wall of the proximal femoral vein.  The patient  also underwent pulmonary function tests.  The preliminary results showed  moderate obstructive airway disease.  The FEV1 was 74% predicted, and the  FEV1-to-FVC ratio was 89% predicted.  There was also evidence of moderately  severe restriction and moderately  severe diffusion defect.   On March 02, 2006, the patient was in stable condition.  He was evaluated by  Dr. Myrtis Ser.  He was felt stable enough for discharge to home.  He would remain  on Coumadin 6 mg a day and follow up in our Coumadin clinic in [place]  for an INR check next week as well as with Dr. Dietrich Pates within the next one  to two weeks.  Regarding his moderate obstructive lung disease on PFTs, he  can follow up with Dr. Dietrich Pates for this.      Tereso Newcomer, P.A.    ______________________________  Willa Rough, M.D.    SW/MEDQ  D:  03/02/2006  T:  03/03/2006  Job:  [id number]   cc:   Dorise Hiss, M.D.  Fax: [phone]

## 2011-03-22 NOTE — Discharge Summary (Signed)
[place]. [facility]  Patient:    Lance Grant, Lance Grant                       MRN: [id number] Adm. Date:  05/12/01 Disc. Date: 05/14/01 Attending:  Luis Abed, M.D. [facility] Dictator:   Rozell Searing, P.A. CC:         Dorise Hiss, M.D.  [facility]   Referring Physician Discharge [place]  PROCEDURES:  Stent LAD - May 13, 2001.  REASON FOR ADMISSION:  [name] is a 77 year old male without prior history of heart disease, with cardiac risk factors notable for hypertension and history of tobacco, who was initially referred to Dr. Lovena Neighbours at the [facility] for evaluation of chest discomfort.  He was referred for exercise Cardiolite stress testing which was abnormal for apical ischemia.  He was thus referred for elective coronary angiography.  Please note Dr. Cheron Every dictated note of May 17 for full details.  LABORATORY DATA:  WBC 4.9, HGB 12.2, HCT 35.7, platelet 142 at discharge. Sodium 141, potassium 3.8, glucose 109, BUN 13, creatinine 1.1 at discharge.  HOSPITAL COURSE:  Patient underwent elective cardiac catheterization on July 10 by Dr. Ephraim Hamburger (see catheterization report for full details) revealing severe single-vessel CAD with mild LV dysfunction.  Specifically, there was a 90% proximal LAD lesion, normal CFX, 40% mid RCA.  Left ventriculogram revealed apical akinesis (EF 40-45%).  Dr. Juanda Chance proceeded with successful stenting of the 90% lesion to 0% residual stenosis.  There were no noted complications.  Patient was cleared for discharge the following morning in hemodynamically-stable condition.  EKG on morning of discharge notable for sinus bradycardia at 52 BPM.  Consideration was given to adding a beta blocker but this will be deferred to a later date given his noted sinus bradycardia.  NEW MEDICATIONS:  Plavix x 4 weeks.  MEDICATIONS AT DISCHARGE: 1. Cardura 4 mg q.d. 2. Aspirin 325 mg q.d. 3. Plavix 75 mg q.d. (x 4  weeks). 4. Nitrostat as directed.  INSTRUCTIONS:  ACTIVITY:  No strenuous activity, heavy lifting, or driving x 2 days.  DIET:  Maintain low fat/cholesterol diet.  WOUND CARE:  Call the office if there is any bleeding/swelling of the groin.  FOLLOW-UP:  Patient lives near the [place] area and has expressed desire to have his cardiac follow-up there.  Therefore, we have scheduled him to follow up with Dr. Garnette Scheuermann.A. clinic on Friday, July 26 at 10:30 a.m. Arrangements will then be made at that time for patient to return for continued cardiac follow-up with Dr. Dietrich Pates.  DISCHARGE DIAGNOSES: 1. Single-vessel coronary artery disease.    a. Stent 90% proximal left anterior descending artery - May 13, 2001.    b. Residual 40% mid right coronary artery.    c. Mild left ventricular dysfunction (ejection fraction 40-45%). 2. History of hypertension. 3. History of gastroesophageal reflux disease. DD:  05/14/01 TD:  05/14/01 Job: [id number]

## 2011-03-22 NOTE — Cardiovascular Report (Signed)
[place]. [facility]  Patient:    Lance Grant, Lance Grant                     MRN: [id number] Proc. Date: 05/13/01 Adm. Date:  [id number] Attending:  Lenoria Farrier CC:         Dorise Hiss, M.D., [facility]  Luis Abed, M.D. [facility]   Cardiac Catheterization  PROCEDURES PERFORMED:  Cardiac catheterization and percutaneous coronary intervention.  CLINICAL HISTORY:  The patient is 77 years old and has a history of hypertension and recently was seen in [place] in consultation by Dr. Myrtis Ser with symptoms of chest pain.  He had a Cardiolite which showed apical ischemia and he was scheduled for evaluation with catheterization.  His procedure was originally scheduled for yesterday, but due to emergencies we had to postpone him to today.  DESCRIPTION OF PROCEDURE:  The procedure was performed via the right femoral artery using an arterial sheath and 6 French preformed coronary catheters.  A front wall arterial puncture was performed and Omnipaque contrast was used. At the completion of the diagnostic study, we made a decision to proceed with intervention on the LAD.  The patient was given weight-adjusted heparin to prolong the ACT to greater than 200 seconds and was given double bolus Integrilin in infusion. We used a 7 [place] JL4 guiding catheter and a [employer]. We direct stented with a 3.0 x 18 mm NIR deploying this with one inflation of 12 atmospheres for 30 seconds.  We then post-dilated with a 3.25 x 15 mm [place] Ranger performing one inflation of 16 atmospheres for 30 seconds.  Repeat diagnostic studies were then performed through the guiding catheter.  The patient tolerated the procedure well and left the laboratory in satisfactory condition.  RESULTS:  The left main coronary artery:  The left main coronary artery was free of significant disease.  Left anterior descending:  The left anterior descending artery gave rise  to three diagonal branches and a septal perforator.  There was 90% stenosis in the proximal vessel after the first diagonal branch and first septal perforator.  Circumflex artery:  The circumflex artery gave rise to a large intermediate branch which had three sub-branches and an AV branch which supplied a small posterolateral branch and an atrial branch.  These vessels were free of significant disease.  Right coronary artery:  The right coronary is a moderately large vessel that gave rise to two right ventricular branches, a posterior descending branch and a posterolateral branch.  There was 40% narrowing in the mid and distal vessel.  LEFT VENTRICULOGRAPHY:  The left ventriculogram performed in the RAO projection showed akinesis of the apex.  The anterolateral wall and inferior wall moved well.  The estimated ejection fraction was 40-45%.  Following stenting of the proximal to mid LAD lesion, the stenosis improved from 90% to 0%.  CONCLUSIONS: 1. Coronary artery disease with 90% stenosis in the proximal left anterior    descending artery, no major obstruction in the circumflex system, 40%    narrowing in the mid distal right coronary artery and apical wall    akinesis. 2. Successful stenting of the proximal left anterior descending lesion with    improvement in percent diameter narrowing from 90% to 0%.  DISPOSITION:  The patient was returned to the postangioplasty unit for further observation. DD:  05/13/01 TD:  05/13/01 Job: [id number]

## 2011-03-22 NOTE — Discharge Summary (Signed)
. [facility]  Patient:    TOBENNA, NEEDS Visit Number: [id number] MRN: [id number]          Service Type: MED Location: [phone] Attending Physician:  Junious Silk Dictated by:   Lavella Hammock, P.A. Admit Date:  08/14/2001 Discharge Date: 08/18/2001   CC:         Dr. Anne Hahn in [place], [place]   Referring Physician Discharge [place]  DATE OF BIRTH:  [birth year]  PROCEDURES: 1. Cardiac catheterization. 2. Coronary arteriogram. 3. Left ventriculogram. 4. PTCA to one vessel.  HOSPITAL COURSE:  Mr. Stoffers is a 77 year old male who has a history of PTCA to his LAD in July 2002 and was seen in the office in [place] on August 14, 2001 for chest pain.  He stated he had had two episodes of chest pain the previous day - one associated with exertion and the one while sitting still.  He stated that the symptoms were similar to his pre-percutaneous intervention symptoms.  He took nitroglycerin x 1 both times with complete relief.  He was admitted to the hospital for anticoagulation, addition of nitrates to his medication regimen, and scheduled for cardiac catheterization. His enzymes were negative for MI except for a troponin that was increased in the indeterminate range at 0.16.  He had a cardiac catheterization on August 17, 2001 which showed a normal left main, an LAD with 95% in-stent restenosis and a ramus intermedius with a 60-70% stenosis.  The RCA had two 30% stenoses and his EF was 60% with some apical hypokinesis.  He had PTCA to his LAD, reducing the stenosis from 95% to 10%.  The patient tolerated the procedure well and the sheath was removed without difficulty.  The next day, he had no significant ecchymosis or hematoma and distal pulses were intact.  He was considered stable for discharge on August 18, 2001.  The patient additionally, while seen in the office, was noted to have significant bradycardia.  While on  the monitor in the hospital he had heart rates dipping into the low 40s.  He had been on Toprol-XL 25 mg p.o. q.d. prior to admission; this was first decreased and then discontinued in an effort to keep his heart rate out of the 40s.  He had a history of sinus bradycardia in a previous admission but it was felt that he could tolerate low-dose beta blocker in follow-up.  This proved not to be the case.  It is recommended that he not be started on beta blockers again.  His hypertension was a problem, however, and he was started on Altace, and the dose was increased to a total of 10 mg q.d.  Prior to admission, he had been on Cardura 4 mg a day and this was continued.  He had a TSH checked secondary to recent fatigue but this was within normal limits.  He was otherwise stable and considered stable for discharge on August 18, 2001.  LABORATORY DATA:  Total cholesterol 138, triglycerides 107, HDL 34, LDL 83. TSH 1.945.  Post procedure CK-MB negative.  Sodium 141, potassium 3.8, chloride 106, CO2 27, BUN 7, creatinine 1.2, glucose 130.  Hemoglobin 11.1, hematocrit 32.5, wbcs 4.6, platelets 131.  DISCHARGE CONDITION:  Improved.  DISCHARGE DIAGNOSES: 1. Unstable anginal pain, status post percutaneous transluminal coronary    angioplasty to the left anterior descending artery this admission for    in-stent restenosis. 2. Status post percutaneous transluminal coronary angioplasty and stent to the  proximal left anterior descending artery on July 14, 2001. 3. Nonobstructive residual disease of 30-40% in the right coronary artery and    circumflex. 4. Preserved left ventricular function with an ejection fraction of    approximately 60%. 5. History of gastroesophageal reflux disease. 6. History of hypertension. 7. History of dislipidemia with low HDL.  DISCHARGE INSTRUCTIONS:  ACTIVITY:  His activity level is to include no driving, sexual, or strenuous activity for the next two days  and then increase gradually.  DIET:  He is to stick to a low fat diet.  WOUND CARE:  He is to call the office for problems with the catheterization site.  FOLLOW-UP:  He is to get a BMET at his next office visit.  He is to see the P.A. on September 08, 2001 at 11 a.m. for Dr. Dietrich Pates.  He is to follow up with Dr. Anne Hahn on a p.r.n. basis.  DISCHARGE MEDICATIONS: 1. Coated aspirin 325 mg q.d. 2. Cardura 4 mg q.d. 3. Zantac 150 mg q.d. p.r.n. 4. Altace 10 mg q.d. 5. Nitroglycerin 0.4 mg sublingual p.r.n. 6. He is not to take Toprol. 7. Foltx daily. Dictated by:   Lavella Hammock, P.A. Attending Physician:  Junious Silk DD:  08/18/01 TD:  08/18/01 Job: [id number]

## 2011-03-22 NOTE — H&P (Signed)
NAME:  Lance Grant, Lance Grant NO.:  [id number]   MEDICAL RECORD NO.:  [id number]          PATIENT TYPE:  AMB   LOCATION:  DAY                           FACILITY:  [facility]   PHYSICIAN:  Dennie Maizes, M.D.   DATE OF BIRTH:  12-22-1920   DATE OF ADMISSION:  07/24/2005  DATE OF DISCHARGE:  LH                                HISTORY & PHYSICAL   CHIEF COMPLAINT:  Hematuria, voiding difficulty, split urinary stream,  urethral stricture.   HISTORY OF PRESENT ILLNESS:  This 77 year old male has a past history of  urethral stricture.  He has undergone cystoscopy and uretal dilation in  1999.  He was seen in the office a few months ago with urinary tract  infection, and he responded well to antibiotic therapy.  He had been voiding  without any difficulty after that.  Recently, he noticed mild intermittent  mild hematuria while he was on vacation in [place].  Also, developed  voiding difficulty and split urinary stream.  He has urinary frequency q.2-3  h and nocturia x1-2.  There is no history of fever, chills, dysuria or back  pain.   Urine cytology was negative for malignant cells.  Cystoscopy revealed bulbus  urethral stricture.  CT scan of the abdomen and pelvis with and without  contrast revealed no significant abnormality.  Simple cyst of the right  kidney and liver were noted.  The patient was brought to the [facility] today for cystoscopy and visual internal ureterotomy.   PAST MEDICAL HISTORY:  1.  Coronary artery disease status post coronary artery bypass grafting.  2.  History of uretal stricture.   MEDICATIONS:  1.  Diovan.  2.  Zocor.  3.  Tenormin.  4.  Celebrex.  5.  Aspirin.   Celebrex and aspirin have been discontinued for the surgery.   ALLERGIES:  MYCIN'S.   PHYSICAL EXAMINATION:  VITAL SIGNS:  Weight 212 pounds.  HEENT:  Normal.  NECK:  No masses.  LUNGS:  Clear to auscultation.  HEART:  Regular rate and rhythm.  No murmurs.  ABDOMEN:   Soft, no palpable flank mass.  No CVA tenderness.  Bladder is not  palpable.  Penis and testes are normal.  There is a left sided spermatocele  measuring about 4-5 cm in size.  RECTAL:  Benign prostate, or 50 g in size.   IMPRESSION:  1.  Voiding difficulty.  2.  Hematuria.  3.  Bulbus uretal stricture.   PLAN:  1.  Cystoscopy and visual internal urethrotomy in [facility].  I      discussed with the patient regarding the diagnosis, operative details,      alternate treatments, outcome, possible risks and complications, and he      has agreed for the procedure to be done.  He will be on Foley catheter      drainage postoperatively for a week.      Dennie Maizes, M.D.  Electronically Signed     SK/MEDQ  D:  07/23/2005  T:  07/24/2005  Job:  [id number]   cc:  Duke Salvia, M.D.  Fax: [phone]

## 2011-03-27 ENCOUNTER — Ambulatory Visit (INDEPENDENT_AMBULATORY_CARE_PROVIDER_SITE_OTHER): Payer: Medicare Other | Admitting: *Deleted

## 2011-03-27 DIAGNOSIS — Z7901 Long term (current) use of anticoagulants: Secondary | ICD-10-CM

## 2011-03-27 DIAGNOSIS — I2699 Other pulmonary embolism without acute cor pulmonale: Secondary | ICD-10-CM

## 2011-03-27 LAB — POCT INR: INR: 2.3

## 2011-04-22 ENCOUNTER — Encounter: Payer: No Typology Code available for payment source | Admitting: *Deleted

## 2011-04-24 ENCOUNTER — Encounter: Payer: No Typology Code available for payment source | Admitting: *Deleted

## 2011-04-24 NOTE — Op Note (Signed)
NAME:  LEAN, FAYSON NO.:  [id number]  MEDICAL RECORD NO.:  [id number]           PATIENT TYPE:  O  LOCATION:  DAYP                          FACILITY:  [facility]  PHYSICIAN:  Dennie Maizes, M.D.   DATE OF BIRTH:  [birth year]  DATE OF PROCEDURE:  02/25/2011 DATE OF DISCHARGE:                              OPERATIVE REPORT   PREOPERATIVE DIAGNOSES:  Microhematuria, filling defect in left renal pelvis.  POSTOPERATIVE DIAGNOSES:  Microhematuria, filling defect in left renal pelvis, bulbous urethral stricture.  OPERATION:  Cystoscopy, visual internal urethrotomy, left retrograde pyelogram, and saline washings from left renal pelvis.  ANESTHESIA:  Spinal.  SURGEON:  Dennie Maizes, MD  ESTIMATED BLOOD LOSS:  Minimal.  DRAINS:  A 16-French Foley catheter in the bladder.  SPECIMEN:  Saline washings some left renal pelvis for cytology.  COMPLICATIONS:  None.  INDICATIONS FOR PROCEDURE:  This 77 year old male had microhematuria. CT scan of the abdomen and pelvis was done with and without contrast. This revealed calcification in the left upper pole calix with possible filling defects.  The patient was taken to the operating room today for further evaluation, cystoscopy, left retrograde pyelogram, possible brush biopsy of the left renal pelvis.  DESCRIPTION OF PROCEDURE:  Spinal anesthesia was induced and the patient was placed on the OR table in the dorsal lithotomy position.  The lower abdomen and genitalia were prepped and draped in a sterile fashion. Cystoscopy was done with a 25-French scope.  The urethra was normal. There was a bulbous urethral stricture which was annular and mild.  The scope could not be introduced into the bladder.  The cystoscope was removed.  A 22-French visual urethrotome was then inserted in the urethra.  The stricture was incised at 12 o'clock with a cold knife.  There were two small strictures in the bulbous urethra.  There was  evidence of previos TURP, and there was no bladder neck obstruction.  The bladder was then examined carefully and found to be trabeculated.  There was no evidence of any foreign body or tumor in the bladder.  A 5-French wedge catheter was then placed in the left ureteral orifice. Retrograde pyelogram was by injecting 7 mL of Omnipaque.  This was done with C-arm fluoroscopy.  Ureter was normal.  There was no evidence of any dilation or filling defect.  The renal pelvis was normal.  The renal pelvis and calices were normal.  There was no evidence of any filling defect.  A 0.038 Bentson guidewire was then inserted through the open-ended catheter and placed into the renal pelvis.  The open-ended catheter was then placed in the left renal pelvis.  Normal saline 10 mL was injected into the renal pelvis for washings of the left renal pelvis.  The washings were collected and sent for cytology for malignant cells.  The catheter as well as the cystoscope was removed.  A 16-French Foley catheter was inserted in the bladder and the urine was clear.  The patient was transferred to the PACU in a satisfactory condition.     Dennie Maizes, M.D.     SK/MEDQ  D:  02/25/2011  T:  02/25/2011  Job:  [id number]  Electronically Signed by Dennie Maizes M.D. on 04/24/2011 10:38:58 AM

## 2011-04-24 NOTE — H&P (Addendum)
  NAME:  NNAEMEKA, SAMSON NO.:  [id number]  MEDICAL RECORD NO.:  [id number]           PATIENT TYPE:  O  LOCATION:  DOIB                          FACILITY:  [facility]  PHYSICIAN:  Dennie Maizes, M.D.   DATE OF BIRTH:  1921-04-19  DATE OF ADMISSION:  02/22/2011 DATE OF DISCHARGE:  02/22/2012LH                             HISTORY & PHYSICAL   CHIEF COMPLAINT:  Microhematuria, enlarged prostate with bladder neck obstruction.  HISTORY OF PRESENT ILLNESS:  This 77 year old male has been under my care for several years.  He has enlarged prostate with bladder neck obstruction.  He had been taking Flomax with good results.  He has good urinary flow, urinary frequency x2-3, and nocturia x1.  There is no history of dysuria, gross hematuria, or abdominal pain at present.  He has been evaluated for hematuria in the past.  He was found to have recurrent urethral strictures.  Visual urethrotomy was done in September 2006.  The patient also has a past history of urinary tract infections. Recent evaluation in the office revealed microhematuria.  The patient has undergone evaluation with CT scan of abdomen and pelvis with and without contrast.  This revealed small calcification in the upper pole of left kidney.  Filling defect in the upper pole calices were noted. Thickening of the bladder mucosa was also noted.  The patient is brought to the [facility] today for further evaluation with cystoscopy, retrograde pyelogram, brush biopsy of left renal pelvis.  PAST MEDICAL HISTORY:  Hypertension, elevated cholesterol, history of DVT and pulmonary embolism, coronary artery disease status post coronary artery bypass grafting, history of urethral stricture status post visual urethrotomy in 2006.  MEDICATIONS:  Flomax, Atacand, atenolol, Zocor, Coumadin, aspirin. Coumadin and aspirin have been stopped for the surgery.  ALLERGIES:  He is allergic to [facility].  REVIEW OF SYSTEMS:   Negative.  PERSONAL HISTORY:  The patient is a nonsmoker.  He does not drink alcohol.  PHYSICAL EXAMINATION:  VITAL SIGNS:  Weight 201 pounds. HEAD, EYES, EARS, NOSE AND THROAT:  Normal. LUNGS:  Clear to auscultation. HEART:  Regular rate and rhythm.  No murmurs. ABDOMEN:  Soft.  No palpable flank mass or costovertebral angle tenderness. GENITOURINARY:  Bladder is not palpable.  Penis normal.  Testes normal on both side.  There is a 4-cm sized epididymal cyst on the left side. RECTAL:  A 40 grams size benign prostate.  IMPRESSION:  Microhematuria, benign prostatic hypertrophy with bladder neck obstruction, filling defect in the left renal pelvis.  Plan on cystoscopy, left retrograde pyelogram, brush biopsy of left renal pelvis under anesthesia.  I have discussed with the patient regarding the diagnosis, operative details, alternative treatment, outcome, possible risks and complications and he has agreed for the procedure to be done.     Dennie Maizes, M.D.     SK/MEDQ  D:  02/24/2011  T:  02/24/2011  Job:  [id number]  Electronically Signed by Dennie Maizes M.D. on 04/24/2011 10:37:57 AM

## 2011-07-04 ENCOUNTER — Telehealth: Payer: Self-pay | Admitting: Cardiology

## 2011-07-04 NOTE — Telephone Encounter (Signed)
PT NEEDS RX FOR COUMADIN CALLED [facility] IN [place] [facility] ASAP

## 2011-07-05 ENCOUNTER — Other Ambulatory Visit: Payer: Self-pay

## 2011-07-05 MED ORDER — WARFARIN SODIUM 2 MG PO TABS: 2.0000 mg | ORAL_TABLET | ORAL | Status: DC

## 2011-07-05 NOTE — Telephone Encounter (Signed)
.   Requested Prescriptions   Pending Prescriptions Disp Refills  . warfarin (COUMADIN) 2 MG tablet 240 tablet 4    Sig: Take 1 tablet (2 mg total) by mouth as directed.   Pt Will contact lisa, in coumadin clinic to schedule more refill regimen.

## 2011-07-09 ENCOUNTER — Other Ambulatory Visit: Payer: Self-pay

## 2011-07-09 MED ORDER — WARFARIN SODIUM 2 MG PO TABS: 2.0000 mg | ORAL_TABLET | ORAL | Status: DC

## 2011-07-10 ENCOUNTER — Ambulatory Visit (INDEPENDENT_AMBULATORY_CARE_PROVIDER_SITE_OTHER): Payer: Medicare Other | Admitting: *Deleted

## 2011-07-10 DIAGNOSIS — I2699 Other pulmonary embolism without acute cor pulmonale: Secondary | ICD-10-CM

## 2011-07-10 DIAGNOSIS — Z7901 Long term (current) use of anticoagulants: Secondary | ICD-10-CM

## 2011-07-25 ENCOUNTER — Ambulatory Visit (INDEPENDENT_AMBULATORY_CARE_PROVIDER_SITE_OTHER): Payer: Medicare Other | Admitting: *Deleted

## 2011-07-25 ENCOUNTER — Telehealth: Payer: Self-pay | Admitting: Cardiology

## 2011-07-25 DIAGNOSIS — Z7901 Long term (current) use of anticoagulants: Secondary | ICD-10-CM

## 2011-07-25 DIAGNOSIS — I2699 Other pulmonary embolism without acute cor pulmonale: Secondary | ICD-10-CM

## 2011-07-25 LAB — POCT INR: INR: 2.6

## 2011-07-25 NOTE — Telephone Encounter (Signed)
PT NEEDS A 15 DAY REFILL CALL[facility] IN [facility] [place] AND NEEDS [facility] PAPER RX TO [facility].

## 2011-08-21 ENCOUNTER — Encounter: Payer: No Typology Code available for payment source | Admitting: *Deleted

## 2011-08-22 ENCOUNTER — Ambulatory Visit (INDEPENDENT_AMBULATORY_CARE_PROVIDER_SITE_OTHER): Payer: Medicare Other | Admitting: *Deleted

## 2011-08-22 DIAGNOSIS — I2699 Other pulmonary embolism without acute cor pulmonale: Secondary | ICD-10-CM

## 2011-08-22 DIAGNOSIS — Z7901 Long term (current) use of anticoagulants: Secondary | ICD-10-CM

## 2011-09-16 ENCOUNTER — Encounter: Payer: No Typology Code available for payment source | Admitting: *Deleted

## 2011-09-18 ENCOUNTER — Telehealth: Payer: Self-pay | Admitting: *Deleted

## 2011-09-18 NOTE — Telephone Encounter (Signed)
PT HAS APPT 09/19/11/TMJ

## 2011-09-19 ENCOUNTER — Ambulatory Visit (INDEPENDENT_AMBULATORY_CARE_PROVIDER_SITE_OTHER): Payer: Medicare Other | Admitting: *Deleted

## 2011-09-19 DIAGNOSIS — I2699 Other pulmonary embolism without acute cor pulmonale: Secondary | ICD-10-CM

## 2011-09-19 DIAGNOSIS — Z7901 Long term (current) use of anticoagulants: Secondary | ICD-10-CM

## 2011-10-17 ENCOUNTER — Encounter: Payer: Self-pay | Admitting: Cardiology

## 2011-10-17 ENCOUNTER — Ambulatory Visit (INDEPENDENT_AMBULATORY_CARE_PROVIDER_SITE_OTHER): Payer: Medicare Other | Admitting: *Deleted

## 2011-10-17 DIAGNOSIS — Z7901 Long term (current) use of anticoagulants: Secondary | ICD-10-CM

## 2011-10-17 DIAGNOSIS — I2699 Other pulmonary embolism without acute cor pulmonale: Secondary | ICD-10-CM

## 2011-11-08 DIAGNOSIS — M25569 Pain in unspecified knee: Secondary | ICD-10-CM | POA: Diagnosis not present

## 2011-11-14 ENCOUNTER — Ambulatory Visit (INDEPENDENT_AMBULATORY_CARE_PROVIDER_SITE_OTHER): Payer: Medicare Other | Admitting: *Deleted

## 2011-11-14 DIAGNOSIS — I2699 Other pulmonary embolism without acute cor pulmonale: Secondary | ICD-10-CM

## 2011-11-14 DIAGNOSIS — Z7901 Long term (current) use of anticoagulants: Secondary | ICD-10-CM | POA: Diagnosis not present

## 2011-11-14 LAB — POCT INR: INR: 2.5

## 2011-12-04 DIAGNOSIS — M25569 Pain in unspecified knee: Secondary | ICD-10-CM | POA: Diagnosis not present

## 2011-12-12 ENCOUNTER — Ambulatory Visit (INDEPENDENT_AMBULATORY_CARE_PROVIDER_SITE_OTHER): Payer: Medicare Other | Admitting: *Deleted

## 2011-12-12 DIAGNOSIS — I2699 Other pulmonary embolism without acute cor pulmonale: Secondary | ICD-10-CM | POA: Diagnosis not present

## 2011-12-12 DIAGNOSIS — Z7901 Long term (current) use of anticoagulants: Secondary | ICD-10-CM | POA: Diagnosis not present

## 2011-12-12 LAB — POCT INR: INR: 2.9

## 2012-01-07 DIAGNOSIS — M25569 Pain in unspecified knee: Secondary | ICD-10-CM | POA: Diagnosis not present

## 2012-01-16 DIAGNOSIS — N35919 Unspecified urethral stricture, male, unspecified site: Secondary | ICD-10-CM | POA: Diagnosis not present

## 2012-01-23 ENCOUNTER — Ambulatory Visit (INDEPENDENT_AMBULATORY_CARE_PROVIDER_SITE_OTHER): Payer: Medicare Other | Admitting: *Deleted

## 2012-01-23 DIAGNOSIS — Z7901 Long term (current) use of anticoagulants: Secondary | ICD-10-CM

## 2012-01-23 DIAGNOSIS — I2699 Other pulmonary embolism without acute cor pulmonale: Secondary | ICD-10-CM

## 2012-01-23 LAB — POCT INR: INR: 2.6

## 2012-01-23 MED ORDER — WARFARIN SODIUM 2 MG PO TABS: ORAL_TABLET | ORAL | Status: DC

## 2012-01-29 DIAGNOSIS — M25569 Pain in unspecified knee: Secondary | ICD-10-CM | POA: Diagnosis not present

## 2012-02-17 ENCOUNTER — Telehealth: Payer: Self-pay | Admitting: Cardiology

## 2012-02-17 MED ORDER — WARFARIN SODIUM 2 MG PO TABS: ORAL_TABLET | ORAL | Status: DC

## 2012-02-17 NOTE — Telephone Encounter (Signed)
**Note De-Identified  Obfuscation** [facility] and appt. has been scheduled with Dr. Dietrich Pates on 03-11-12, pt. is aware./LV

## 2012-02-17 NOTE — Telephone Encounter (Signed)
Patient needs about 1 week of Coumadin [facility] here in [place]. / tg

## 2012-02-19 DIAGNOSIS — M25569 Pain in unspecified knee: Secondary | ICD-10-CM | POA: Diagnosis not present

## 2012-03-11 ENCOUNTER — Ambulatory Visit: Payer: No Typology Code available for payment source | Admitting: Cardiology

## 2012-03-11 ENCOUNTER — Ambulatory Visit (INDEPENDENT_AMBULATORY_CARE_PROVIDER_SITE_OTHER): Payer: Medicare Other | Admitting: *Deleted

## 2012-03-11 ENCOUNTER — Ambulatory Visit (INDEPENDENT_AMBULATORY_CARE_PROVIDER_SITE_OTHER): Payer: Medicare Other | Admitting: Adult Health

## 2012-03-11 ENCOUNTER — Ambulatory Visit: Payer: No Typology Code available for payment source | Admitting: Physician Assistant

## 2012-03-11 ENCOUNTER — Encounter: Payer: Self-pay | Admitting: Adult Health

## 2012-03-11 VITALS — BP 148/80 | HR 89 | Ht 69.0 in | Wt 189.0 lb

## 2012-03-11 DIAGNOSIS — Z7901 Long term (current) use of anticoagulants: Secondary | ICD-10-CM

## 2012-03-11 DIAGNOSIS — Z8679 Personal history of other diseases of the circulatory system: Secondary | ICD-10-CM | POA: Diagnosis not present

## 2012-03-11 DIAGNOSIS — I10 Essential (primary) hypertension: Secondary | ICD-10-CM | POA: Diagnosis not present

## 2012-03-11 DIAGNOSIS — I25810 Atherosclerosis of coronary artery bypass graft(s) without angina pectoris: Secondary | ICD-10-CM

## 2012-03-11 DIAGNOSIS — I2699 Other pulmonary embolism without acute cor pulmonale: Secondary | ICD-10-CM

## 2012-03-11 LAB — POCT INR: INR: 1.2

## 2012-03-11 MED ORDER — WARFARIN SODIUM 2 MG PO TABS: ORAL_TABLET | ORAL | Status: DC

## 2012-03-11 NOTE — Assessment & Plan Note (Signed)
Essentially controlled. No changes in medication regimen.

## 2012-03-11 NOTE — Patient Instructions (Signed)
Your physician recommends that you schedule a follow-up appointment in: 6 months  

## 2012-03-11 NOTE — Assessment & Plan Note (Signed)
He is followed by Dr. Clifton James for PAD. No plans for invasive intervention at this time as he has small vessel disease.  Continue medical management.

## 2012-03-11 NOTE — Assessment & Plan Note (Signed)
He is without cardiac complaint. No changes in his medications.. Will continue him on atenolol, atacand and ASA. No plans to have stress test or other studies unless he is symptomatic.

## 2012-03-11 NOTE — Progress Notes (Signed)
HPI: Mr. Finken is a 78 y/o patient of Dr. Dietrich Pates and Dr. Clifton James who has been lost to follow-up, we are following for ongoing assessment and treatment of known CAD, s/p CABG 2002,, PVD with small vessel disease, pulmonary embolism on coumadin (followed in our clinic), hypertension, with history of COPD. He is followed in the [place] clinic for labs annually. He is now established with Dr. Loleta Chance in [facility] for PCP.  He has been out of his coumadin for one week.  He offers no complaints of chest pain, DOE, or dizziness. He has some chronic right knee pain which is being followed by Dr. Hilda Lias. He is due to have fluid drawn off of it this week. He has had no hospitalizations or ER visits since last being seen.  Allergies  Allergen Reactions  . Erythromycin     Current Outpatient Prescriptions  Medication Sig Dispense Refill  . aspirin 81 MG tablet Take 81 mg by mouth daily.        Marland Kitchen atenolol (TENORMIN) 25 MG tablet Take 25 mg by mouth daily.        . candesartan (ATACAND) 16 MG tablet Take 16 mg by mouth daily.        Marland Kitchen esomeprazole (NEXIUM) 40 MG capsule Take 40 mg by mouth daily before breakfast.      . fexofenadine (ALLEGRA) 180 MG tablet Take 180 mg by mouth daily.        . furosemide (LASIX) 40 MG tablet Take 40 mg by mouth daily.      . nitroGLYCERIN (NITROLINGUAL) 0.4 MG/SPRAY spray Place 1 spray under the tongue as needed.        . simvastatin (ZOCOR) 40 MG tablet Take 40 mg by mouth at bedtime.        . Tamsulosin HCl (FLOMAX) 0.4 MG CAPS Take 0.4 mg by mouth daily.        . traMADol (ULTRAM) 50 MG tablet Take 50 mg by mouth every 6 (six) hours as needed. Maximum dose= 8 tablets per day       . warfarin (COUMADIN) 2 MG tablet Take 2 tablets daily except 3 tablets on Mondays, Wednesdays and Fridays  204 tablet  0  . DISCONTD: warfarin (COUMADIN) 2 MG tablet Take 2 tablets daily except 3 tablets on Mondays, Wednesdays and Fridays  15 tablet  0    Past Medical History  Diagnosis Date  .  Hypertension   . COPD (chronic obstructive pulmonary disease)   . Pleural effusion   . Renal insufficiency     Mild cr 1.9 08/2005, 1.5 02/2007, 1.7 12/2007, 1.7 06/2008  . Seminal vesiculitis   . GERD (gastroesophageal reflux disease)   . Dyslipidemia   . Sinus bradycardia   . History of atherosclerotic cardiovascular disease     Past Surgical History  Procedure Date  . Coronary artery bypass graft 10/2001  . Transurethral resection of prostate 07/2005    ZOX:WRUEAV sinus rhythm  PHYSICAL EXAM BP 148/80  Pulse 89  Ht 5\' 9"  (1.753 m)  Wt 189 lb (85.73 kg)  BMI 27.91 kg/m2 General: Well developed, well nourished, in no acute distress Head: Eyes PERRLA, No xanthomas.   Normal cephalic and atraumatic Lungs: Clear bilaterally to auscultation and percussion. Heart: HRRR S1 S2, soft 1/6 systolic murmur.  Pulses are 2+ & equal.            No carotid bruit. No JVD.  No abdominal bruits. No femoral bruits. Abdomen: Bowel sounds are positive,  abdomen soft and non-tender without masses or                  Hernia's noted. Msk:  Back normal, normal gait. Normal strength and tone for age. Extremities: No clubbing, cyanosis, mild 1+ edema.left leg. Mild fluid pocket in right lateral knee.    Neuro: Alert and oriented X 3. Psych:  Good affect, responds appropriately EKG: NSR with LAE. Rate of 76 bpm. T-wave inversion in V5 and V6. I.   ASSESSMENT AND PLAN

## 2012-03-11 NOTE — Assessment & Plan Note (Signed)
He has been out of his coumadin for over a week. Has no local Rx for this. INR was checked today by Vashti Hey RN, he will continue his coumadin dosing as directed. Rx is provided for local pharmacy. He will follow-up in clinic as directed.

## 2012-03-17 DIAGNOSIS — M25569 Pain in unspecified knee: Secondary | ICD-10-CM | POA: Diagnosis not present

## 2012-04-01 DIAGNOSIS — M25569 Pain in unspecified knee: Secondary | ICD-10-CM | POA: Diagnosis not present

## 2012-04-10 DIAGNOSIS — R42 Dizziness and giddiness: Secondary | ICD-10-CM | POA: Diagnosis not present

## 2012-04-14 DIAGNOSIS — N35919 Unspecified urethral stricture, male, unspecified site: Secondary | ICD-10-CM | POA: Diagnosis not present

## 2012-04-17 DIAGNOSIS — I10 Essential (primary) hypertension: Secondary | ICD-10-CM | POA: Diagnosis not present

## 2012-04-17 DIAGNOSIS — R42 Dizziness and giddiness: Secondary | ICD-10-CM | POA: Diagnosis not present

## 2012-05-13 DIAGNOSIS — M25569 Pain in unspecified knee: Secondary | ICD-10-CM | POA: Diagnosis not present

## 2012-05-28 DIAGNOSIS — M25569 Pain in unspecified knee: Secondary | ICD-10-CM | POA: Diagnosis not present

## 2012-06-24 ENCOUNTER — Ambulatory Visit (INDEPENDENT_AMBULATORY_CARE_PROVIDER_SITE_OTHER): Payer: Medicare Other | Admitting: *Deleted

## 2012-06-24 DIAGNOSIS — I2699 Other pulmonary embolism without acute cor pulmonale: Secondary | ICD-10-CM

## 2012-06-24 DIAGNOSIS — I10 Essential (primary) hypertension: Secondary | ICD-10-CM | POA: Diagnosis not present

## 2012-06-24 DIAGNOSIS — Z7901 Long term (current) use of anticoagulants: Secondary | ICD-10-CM

## 2012-06-24 DIAGNOSIS — M25569 Pain in unspecified knee: Secondary | ICD-10-CM | POA: Diagnosis not present

## 2012-06-24 LAB — POCT INR: INR: 1.1

## 2012-06-24 MED ORDER — WARFARIN SODIUM 2 MG PO TABS: ORAL_TABLET | ORAL | Status: DC

## 2012-07-02 ENCOUNTER — Ambulatory Visit (INDEPENDENT_AMBULATORY_CARE_PROVIDER_SITE_OTHER): Payer: Medicare Other | Admitting: *Deleted

## 2012-07-02 DIAGNOSIS — I2699 Other pulmonary embolism without acute cor pulmonale: Secondary | ICD-10-CM

## 2012-07-02 DIAGNOSIS — Z7901 Long term (current) use of anticoagulants: Secondary | ICD-10-CM

## 2012-07-16 ENCOUNTER — Ambulatory Visit (INDEPENDENT_AMBULATORY_CARE_PROVIDER_SITE_OTHER): Payer: Medicare Other | Admitting: *Deleted

## 2012-07-16 DIAGNOSIS — Z7901 Long term (current) use of anticoagulants: Secondary | ICD-10-CM

## 2012-07-16 DIAGNOSIS — I2699 Other pulmonary embolism without acute cor pulmonale: Secondary | ICD-10-CM

## 2012-07-31 DIAGNOSIS — J209 Acute bronchitis, unspecified: Secondary | ICD-10-CM | POA: Diagnosis not present

## 2012-07-31 DIAGNOSIS — J069 Acute upper respiratory infection, unspecified: Secondary | ICD-10-CM | POA: Diagnosis not present

## 2012-08-03 DIAGNOSIS — M25569 Pain in unspecified knee: Secondary | ICD-10-CM | POA: Diagnosis not present

## 2012-08-12 ENCOUNTER — Ambulatory Visit (INDEPENDENT_AMBULATORY_CARE_PROVIDER_SITE_OTHER): Payer: Medicare Other | Admitting: *Deleted

## 2012-08-12 DIAGNOSIS — I2699 Other pulmonary embolism without acute cor pulmonale: Secondary | ICD-10-CM | POA: Diagnosis not present

## 2012-08-12 DIAGNOSIS — Z7901 Long term (current) use of anticoagulants: Secondary | ICD-10-CM

## 2012-09-02 DIAGNOSIS — M25569 Pain in unspecified knee: Secondary | ICD-10-CM | POA: Diagnosis not present

## 2012-09-08 ENCOUNTER — Encounter: Payer: Self-pay | Admitting: Cardiology

## 2012-09-08 ENCOUNTER — Ambulatory Visit (INDEPENDENT_AMBULATORY_CARE_PROVIDER_SITE_OTHER): Payer: Medicare Other | Admitting: Cardiology

## 2012-09-08 VITALS — BP 120/78 | HR 80 | Ht 69.0 in | Wt 186.0 lb

## 2012-09-08 DIAGNOSIS — K219 Gastro-esophageal reflux disease without esophagitis: Secondary | ICD-10-CM | POA: Insufficient documentation

## 2012-09-08 DIAGNOSIS — I2699 Other pulmonary embolism without acute cor pulmonale: Secondary | ICD-10-CM | POA: Diagnosis not present

## 2012-09-08 DIAGNOSIS — I7090 Unspecified atherosclerosis: Secondary | ICD-10-CM

## 2012-09-08 DIAGNOSIS — I2510 Atherosclerotic heart disease of native coronary artery without angina pectoris: Secondary | ICD-10-CM

## 2012-09-08 DIAGNOSIS — I10 Essential (primary) hypertension: Secondary | ICD-10-CM | POA: Insufficient documentation

## 2012-09-08 DIAGNOSIS — Z7901 Long term (current) use of anticoagulants: Secondary | ICD-10-CM | POA: Diagnosis not present

## 2012-09-08 DIAGNOSIS — J449 Chronic obstructive pulmonary disease, unspecified: Secondary | ICD-10-CM | POA: Insufficient documentation

## 2012-09-08 DIAGNOSIS — E785 Hyperlipidemia, unspecified: Secondary | ICD-10-CM | POA: Insufficient documentation

## 2012-09-08 DIAGNOSIS — N183 Chronic kidney disease, stage 3 unspecified: Secondary | ICD-10-CM | POA: Insufficient documentation

## 2012-09-08 LAB — POCT INR: INR: 1.6

## 2012-09-08 NOTE — Progress Notes (Signed)
Patient ID: Lance Grant, male   DOB: 1921/01/10, 78 y.o.   MRN: [id number]  HPI: Scheduled return visit for this very nice older gentleman with coronary artery disease, multiple cardiovascular risk factors and chronic anticoagulation to prevent recurrent pulmonary embolism following a first episode 6 years ago.  Since his last visit, he has done superbly.  He cares for his wife, who has significant health problems including diabetes.  He remains active, but does not exercise routinely.  He has nitroglycerin, but uses it only on very rare occasions.  Prior to Admission medications   Medication Sig Start Date End Date Taking? Authorizing Provider  atenolol (TENORMIN) 25 MG tablet Take 25 mg by mouth daily.     Yes Historical Provider, MD  candesartan (ATACAND) 16 MG tablet Take 16 mg by mouth daily.     Yes Historical Provider, MD  esomeprazole (NEXIUM) 40 MG capsule Take 40 mg by mouth daily before breakfast.   Yes Historical Provider, MD  fexofenadine (ALLEGRA) 180 MG tablet Take 180 mg by mouth daily.     Yes Historical Provider, MD  furosemide (LASIX) 40 MG tablet Take 40 mg by mouth daily.   Yes Historical Provider, MD  nitroGLYCERIN (NITROLINGUAL) 0.4 MG/SPRAY spray Place 1 spray under the tongue as needed.     Yes Historical Provider, MD  simvastatin (ZOCOR) 40 MG tablet Take 40 mg by mouth at bedtime.     Yes Historical Provider, MD  Tamsulosin HCl (FLOMAX) 0.4 MG CAPS Take 0.4 mg by mouth daily.     Yes Historical Provider, MD  traMADol (ULTRAM) 50 MG tablet Take 50 mg by mouth every 6 (six) hours as needed. Maximum dose= 8 tablets per day    Yes Historical Provider, MD  warfarin (COUMADIN) 2 MG tablet Take 2 tablets daily except 3 tablets on Mondays, Wednesdays and Fridays 06/24/12  Yes Kathleene Hazel, MD   Allergies  Allergen Reactions  . Erythromycin      Past medical history, social history, and family history reviewed and updated.  ROS: Denies dyspnea, orthopnea, PND,  palpitations, lightheadedness or syncope.  Stable weight and appetite.  Colonoscopy performed approximately 3 years ago and reportedly negative.  He does not routinely receive influenza vaccine and has not had Pneumovax as far as he knows.  He experiences no claudication.  All other systems reviewed and are negative.  PHYSICAL EXAM: BP 120/78  Pulse 80  Ht 5\' 9"  (1.753 m)  Wt 84.369 kg (186 lb)  BMI 27.47 kg/m2  SpO2 96%  General-Well developed; no acute distress Body habitus-proportionate weight and height Neck-No JVD; no carotid bruits Lungs-clear lung fields; resonant to percussion Cardiovascular-normal PMI; normal S1 and S2; grade 1-2/6 systolic ejection murmur Abdomen-normal bowel sounds; soft and non-tender without masses or organomegaly Musculoskeletal-No deformities, no cyanosis or clubbing Neurologic-Normal cranial nerves; symmetric strength and tone Skin-Warm, no significant lesions Extremities-1+distal pulses; 1-2+ ankle and pretibial edema  ASSESSMENT AND PLAN:  [name], MD 09/08/2012 1:39 PM

## 2012-09-08 NOTE — Assessment & Plan Note (Signed)
Blood pressure has typically been elevated in the past, but is relatively good today.  Since he is on a minimal dose of lisinopril, we will increase this to 10 mg per day and request a list of home blood pressure determinations.

## 2012-09-08 NOTE — Progress Notes (Deleted)
Name: Lance Grant    DOB: July 30, 1921  Age: 78 y.o.  MR#: [id number]       PCP:  Dorise Hiss, MD      Insurance: @PAYORNAME @   CC:   No chief complaint on file.  MEDICATION LIST  VS BP 120/78  Pulse 80  Ht 5\' 9"  (1.753 m)  Wt 186 lb (84.369 kg)  BMI 27.47 kg/m2  SpO2 96%  Weights Current Weight  09/08/12 186 lb (84.369 kg)  03/11/12 189 lb (85.73 kg)  05/03/10 194 lb (87.998 kg)    Blood Pressure  BP Readings from Last 3 Encounters:  09/08/12 120/78  03/11/12 148/80  05/03/10 138/80     Admit date:  (Not on file) Last encounter with RMR:  Visit date not found   Allergy Allergies  Allergen Reactions  . Erythromycin     Current Outpatient Prescriptions  Medication Sig Dispense Refill  . aspirin 81 MG tablet Take 81 mg by mouth daily.        Marland Kitchen atenolol (TENORMIN) 25 MG tablet Take 25 mg by mouth daily.        . candesartan (ATACAND) 16 MG tablet Take 16 mg by mouth daily.        Marland Kitchen esomeprazole (NEXIUM) 40 MG capsule Take 40 mg by mouth daily before breakfast.      . fexofenadine (ALLEGRA) 180 MG tablet Take 180 mg by mouth daily.        . furosemide (LASIX) 40 MG tablet Take 40 mg by mouth daily.      . nitroGLYCERIN (NITROLINGUAL) 0.4 MG/SPRAY spray Place 1 spray under the tongue as needed.        . simvastatin (ZOCOR) 40 MG tablet Take 40 mg by mouth at bedtime.        . Tamsulosin HCl (FLOMAX) 0.4 MG CAPS Take 0.4 mg by mouth daily.        . traMADol (ULTRAM) 50 MG tablet Take 50 mg by mouth every 6 (six) hours as needed. Maximum dose= 8 tablets per day       . warfarin (COUMADIN) 2 MG tablet Take 2 tablets daily except 3 tablets on Mondays, Wednesdays and Fridays  90 tablet  3    Discontinued Meds:   There are no discontinued medications.  Patient Active Problem List  Diagnosis  . PULMONARY EMBOLISM  . Hypertension  . COPD (chronic obstructive pulmonary disease)  . Chronic kidney disease, stage III (moderate)  . GERD (gastroesophageal reflux disease)    . Hyperlipidemia  . Arteriosclerotic cardiovascular disease (ASCVD)    LABS Anti-coag visit on 08/12/2012  Component Date Value  . INR 08/12/2012 2.2   Anti-coag visit on 07/16/2012  Component Date Value  . INR 07/16/2012 2.6   Anti-coag visit on 07/02/2012  Component Date Value  . INR 07/02/2012 2.0   Anti-coag visit on 06/24/2012  Component Date Value  . INR 06/24/2012 1.1      Results for this Opt Visit:     Results for orders placed in visit on 08/12/12  POCT INR      Component Value Range   INR 2.2      EKG Orders placed in visit on 03/11/12  . EKG 12-LEAD     Prior Assessment and Plan Problem List as of 09/08/2012            Cardiology Problems   PULMONARY EMBOLISM   Hypertension   Hyperlipidemia   Arteriosclerotic cardiovascular disease (ASCVD)  Other   COPD (chronic obstructive pulmonary disease)   Chronic kidney disease, stage III (moderate)   GERD (gastroesophageal reflux disease)       Imaging: No results found.   FRS Calculation: Score not calculated

## 2012-09-08 NOTE — Assessment & Plan Note (Signed)
No recent lipid profile available.  We will either obtain results from Dr. Loleta Chance or send patient for a lipid profile.

## 2012-09-08 NOTE — Patient Instructions (Addendum)
Your physician recommends that you schedule a follow-up appointment in: 1 year  Lance Grant on 11/20 at 9 am to have coumadin checked  Your physician has recommended you make the following change in your medication:  1 - STOP Aspirin 2 - Take an extra dose of coumadin today then resume previous dose  Stool x 3 and return to the office asap

## 2012-09-08 NOTE — Assessment & Plan Note (Addendum)
Patient continues to do well on warfarin.  INRs have been stable and therapeutic in recent months.  A CBC and stool for Hemoccult testing will be obtained to monitor for possible GI blood loss.

## 2012-09-08 NOTE — Assessment & Plan Note (Signed)
No apparent recurrent manifestations of coronary disease, now 11 years following CABG surgery.  We will continue to optimally manage cardiovascular risk factors.

## 2012-09-08 NOTE — Assessment & Plan Note (Signed)
No laboratory studies are available for my review since late 2009.  We will seek more recent studies from patient's PCP.

## 2012-09-15 ENCOUNTER — Other Ambulatory Visit: Payer: Self-pay | Admitting: *Deleted

## 2012-09-15 ENCOUNTER — Encounter: Payer: Self-pay | Admitting: *Deleted

## 2012-09-15 DIAGNOSIS — I2510 Atherosclerotic heart disease of native coronary artery without angina pectoris: Secondary | ICD-10-CM | POA: Diagnosis not present

## 2012-09-15 DIAGNOSIS — I10 Essential (primary) hypertension: Secondary | ICD-10-CM | POA: Diagnosis not present

## 2012-09-15 DIAGNOSIS — Z7901 Long term (current) use of anticoagulants: Secondary | ICD-10-CM | POA: Diagnosis not present

## 2012-09-15 DIAGNOSIS — E785 Hyperlipidemia, unspecified: Secondary | ICD-10-CM | POA: Diagnosis not present

## 2012-09-15 DIAGNOSIS — I2699 Other pulmonary embolism without acute cor pulmonale: Secondary | ICD-10-CM | POA: Diagnosis not present

## 2012-09-15 DIAGNOSIS — I7090 Unspecified atherosclerosis: Secondary | ICD-10-CM | POA: Diagnosis not present

## 2012-09-15 NOTE — Addendum Note (Signed)
Addended by: Reather Laurence A on: 09/15/2012 10:22 AM   Modules accepted: Orders

## 2012-09-17 ENCOUNTER — Other Ambulatory Visit: Payer: Self-pay | Admitting: Cardiology

## 2012-09-17 DIAGNOSIS — Z7901 Long term (current) use of anticoagulants: Secondary | ICD-10-CM | POA: Diagnosis not present

## 2012-09-17 DIAGNOSIS — I10 Essential (primary) hypertension: Secondary | ICD-10-CM | POA: Diagnosis not present

## 2012-09-17 DIAGNOSIS — E785 Hyperlipidemia, unspecified: Secondary | ICD-10-CM | POA: Diagnosis not present

## 2012-09-17 DIAGNOSIS — I7090 Unspecified atherosclerosis: Secondary | ICD-10-CM | POA: Diagnosis not present

## 2012-09-17 DIAGNOSIS — I2510 Atherosclerotic heart disease of native coronary artery without angina pectoris: Secondary | ICD-10-CM | POA: Diagnosis not present

## 2012-09-17 DIAGNOSIS — I2699 Other pulmonary embolism without acute cor pulmonale: Secondary | ICD-10-CM | POA: Diagnosis not present

## 2012-09-17 LAB — COMPREHENSIVE METABOLIC PANEL
ALT: 15 U/L (ref 0–53)
BUN: 27 mg/dL — ABNORMAL HIGH (ref 6–23)
CO2: 29 mEq/L (ref 19–32)
Calcium: 9.9 mg/dL (ref 8.4–10.5)
Chloride: 104 mEq/L (ref 96–112)
Creat: 1.57 mg/dL — ABNORMAL HIGH (ref 0.50–1.35)

## 2012-09-17 LAB — CBC
MCH: 28 pg (ref 26.0–34.0)
MCV: 81.9 fL (ref 78.0–100.0)
Platelets: 169 10*3/uL (ref 150–400)
RBC: 4.53 MIL/uL (ref 4.22–5.81)

## 2012-09-17 LAB — LIPID PANEL
Cholesterol: 162 mg/dL (ref 0–200)
Total CHOL/HDL Ratio: 4.2 Ratio

## 2012-09-18 ENCOUNTER — Ambulatory Visit (INDEPENDENT_AMBULATORY_CARE_PROVIDER_SITE_OTHER): Payer: No Typology Code available for payment source | Admitting: *Deleted

## 2012-09-18 ENCOUNTER — Encounter: Payer: Self-pay | Admitting: Cardiology

## 2012-09-23 ENCOUNTER — Ambulatory Visit (INDEPENDENT_AMBULATORY_CARE_PROVIDER_SITE_OTHER): Payer: Medicare Other | Admitting: *Deleted

## 2012-09-23 DIAGNOSIS — I2699 Other pulmonary embolism without acute cor pulmonale: Secondary | ICD-10-CM | POA: Diagnosis not present

## 2012-09-23 DIAGNOSIS — Z7901 Long term (current) use of anticoagulants: Secondary | ICD-10-CM

## 2012-09-29 DIAGNOSIS — M25569 Pain in unspecified knee: Secondary | ICD-10-CM | POA: Diagnosis not present

## 2012-10-14 ENCOUNTER — Ambulatory Visit (INDEPENDENT_AMBULATORY_CARE_PROVIDER_SITE_OTHER): Payer: Medicare Other | Admitting: *Deleted

## 2012-10-14 DIAGNOSIS — Z7901 Long term (current) use of anticoagulants: Secondary | ICD-10-CM | POA: Diagnosis not present

## 2012-10-14 DIAGNOSIS — I2699 Other pulmonary embolism without acute cor pulmonale: Secondary | ICD-10-CM | POA: Diagnosis not present

## 2012-10-14 LAB — POCT INR: INR: 2.2

## 2012-10-27 DIAGNOSIS — Z7901 Long term (current) use of anticoagulants: Secondary | ICD-10-CM | POA: Diagnosis not present

## 2012-10-27 DIAGNOSIS — K5900 Constipation, unspecified: Secondary | ICD-10-CM | POA: Diagnosis not present

## 2012-10-27 DIAGNOSIS — I10 Essential (primary) hypertension: Secondary | ICD-10-CM | POA: Diagnosis not present

## 2012-10-27 DIAGNOSIS — E785 Hyperlipidemia, unspecified: Secondary | ICD-10-CM | POA: Diagnosis not present

## 2012-10-27 DIAGNOSIS — Z23 Encounter for immunization: Secondary | ICD-10-CM | POA: Diagnosis not present

## 2012-11-05 ENCOUNTER — Telehealth: Payer: Self-pay | Admitting: Cardiology

## 2012-11-05 NOTE — Telephone Encounter (Signed)
Patient no showed for Coumadin appointment.  Rescheduled with patient for 11/11/12. tgs

## 2012-11-11 ENCOUNTER — Ambulatory Visit (INDEPENDENT_AMBULATORY_CARE_PROVIDER_SITE_OTHER): Payer: Medicare Other | Admitting: *Deleted

## 2012-11-11 DIAGNOSIS — Z7901 Long term (current) use of anticoagulants: Secondary | ICD-10-CM | POA: Diagnosis not present

## 2012-11-11 DIAGNOSIS — I2699 Other pulmonary embolism without acute cor pulmonale: Secondary | ICD-10-CM

## 2012-11-11 LAB — POCT INR: INR: 1.3

## 2012-11-23 ENCOUNTER — Ambulatory Visit (INDEPENDENT_AMBULATORY_CARE_PROVIDER_SITE_OTHER): Payer: Medicare Other | Admitting: *Deleted

## 2012-11-23 DIAGNOSIS — Z7901 Long term (current) use of anticoagulants: Secondary | ICD-10-CM | POA: Diagnosis not present

## 2012-11-23 DIAGNOSIS — I2699 Other pulmonary embolism without acute cor pulmonale: Secondary | ICD-10-CM | POA: Diagnosis not present

## 2012-11-23 LAB — POCT INR: INR: 1.4

## 2012-11-30 ENCOUNTER — Ambulatory Visit (INDEPENDENT_AMBULATORY_CARE_PROVIDER_SITE_OTHER): Payer: Medicare Other | Admitting: *Deleted

## 2012-11-30 DIAGNOSIS — I2699 Other pulmonary embolism without acute cor pulmonale: Secondary | ICD-10-CM | POA: Diagnosis not present

## 2012-11-30 DIAGNOSIS — Z7901 Long term (current) use of anticoagulants: Secondary | ICD-10-CM

## 2012-11-30 LAB — POCT INR: INR: 1.8

## 2012-12-03 DIAGNOSIS — M79609 Pain in unspecified limb: Secondary | ICD-10-CM | POA: Diagnosis not present

## 2012-12-03 DIAGNOSIS — I10 Essential (primary) hypertension: Secondary | ICD-10-CM | POA: Diagnosis not present

## 2012-12-07 ENCOUNTER — Ambulatory Visit (INDEPENDENT_AMBULATORY_CARE_PROVIDER_SITE_OTHER): Payer: Medicare Other | Admitting: *Deleted

## 2012-12-07 DIAGNOSIS — I2699 Other pulmonary embolism without acute cor pulmonale: Secondary | ICD-10-CM | POA: Diagnosis not present

## 2012-12-07 DIAGNOSIS — Z7901 Long term (current) use of anticoagulants: Secondary | ICD-10-CM

## 2012-12-07 LAB — POCT INR: INR: 2.6

## 2012-12-13 ENCOUNTER — Other Ambulatory Visit: Payer: Self-pay | Admitting: Cardiovascular Disease

## 2012-12-14 ENCOUNTER — Telehealth: Payer: Self-pay | Admitting: Cardiology

## 2012-12-14 MED ORDER — WARFARIN SODIUM 2 MG PO TABS: ORAL_TABLET | ORAL | Status: DC

## 2012-12-14 NOTE — Telephone Encounter (Signed)
PT NEEDS WARFRIN CALLED IN HE IS ABOUT OUT

## 2012-12-16 ENCOUNTER — Ambulatory Visit (INDEPENDENT_AMBULATORY_CARE_PROVIDER_SITE_OTHER): Payer: Medicare Other | Admitting: *Deleted

## 2012-12-16 DIAGNOSIS — I2699 Other pulmonary embolism without acute cor pulmonale: Secondary | ICD-10-CM

## 2012-12-16 DIAGNOSIS — Z7901 Long term (current) use of anticoagulants: Secondary | ICD-10-CM

## 2012-12-24 ENCOUNTER — Ambulatory Visit (INDEPENDENT_AMBULATORY_CARE_PROVIDER_SITE_OTHER): Payer: Medicare Other | Admitting: *Deleted

## 2012-12-24 DIAGNOSIS — I2699 Other pulmonary embolism without acute cor pulmonale: Secondary | ICD-10-CM

## 2012-12-24 DIAGNOSIS — Z7901 Long term (current) use of anticoagulants: Secondary | ICD-10-CM

## 2013-01-04 ENCOUNTER — Ambulatory Visit (INDEPENDENT_AMBULATORY_CARE_PROVIDER_SITE_OTHER): Payer: Medicare Other | Admitting: *Deleted

## 2013-01-04 DIAGNOSIS — Z7901 Long term (current) use of anticoagulants: Secondary | ICD-10-CM | POA: Diagnosis not present

## 2013-01-04 DIAGNOSIS — I2699 Other pulmonary embolism without acute cor pulmonale: Secondary | ICD-10-CM

## 2013-01-06 DIAGNOSIS — M25569 Pain in unspecified knee: Secondary | ICD-10-CM | POA: Diagnosis not present

## 2013-01-06 DIAGNOSIS — I10 Essential (primary) hypertension: Secondary | ICD-10-CM | POA: Diagnosis not present

## 2013-01-18 ENCOUNTER — Ambulatory Visit (INDEPENDENT_AMBULATORY_CARE_PROVIDER_SITE_OTHER): Payer: Medicare Other | Admitting: *Deleted

## 2013-01-18 DIAGNOSIS — Z7901 Long term (current) use of anticoagulants: Secondary | ICD-10-CM

## 2013-01-18 DIAGNOSIS — I2699 Other pulmonary embolism without acute cor pulmonale: Secondary | ICD-10-CM | POA: Diagnosis not present

## 2013-01-29 DIAGNOSIS — I10 Essential (primary) hypertension: Secondary | ICD-10-CM | POA: Diagnosis not present

## 2013-01-29 DIAGNOSIS — M25569 Pain in unspecified knee: Secondary | ICD-10-CM | POA: Diagnosis not present

## 2013-01-29 DIAGNOSIS — K5289 Other specified noninfective gastroenteritis and colitis: Secondary | ICD-10-CM | POA: Diagnosis not present

## 2013-02-03 DIAGNOSIS — I10 Essential (primary) hypertension: Secondary | ICD-10-CM | POA: Diagnosis not present

## 2013-02-03 DIAGNOSIS — R413 Other amnesia: Secondary | ICD-10-CM | POA: Diagnosis not present

## 2013-02-03 DIAGNOSIS — Z7901 Long term (current) use of anticoagulants: Secondary | ICD-10-CM | POA: Diagnosis not present

## 2013-02-03 DIAGNOSIS — K5900 Constipation, unspecified: Secondary | ICD-10-CM | POA: Diagnosis not present

## 2013-02-08 ENCOUNTER — Ambulatory Visit (INDEPENDENT_AMBULATORY_CARE_PROVIDER_SITE_OTHER): Payer: Medicare Other | Admitting: *Deleted

## 2013-02-08 DIAGNOSIS — I2699 Other pulmonary embolism without acute cor pulmonale: Secondary | ICD-10-CM | POA: Diagnosis not present

## 2013-02-08 DIAGNOSIS — K5900 Constipation, unspecified: Secondary | ICD-10-CM | POA: Diagnosis not present

## 2013-02-08 DIAGNOSIS — Z7901 Long term (current) use of anticoagulants: Secondary | ICD-10-CM | POA: Diagnosis not present

## 2013-02-08 LAB — POCT INR: INR: 1.5

## 2013-02-15 ENCOUNTER — Ambulatory Visit (INDEPENDENT_AMBULATORY_CARE_PROVIDER_SITE_OTHER): Payer: Medicare Other | Admitting: *Deleted

## 2013-02-15 DIAGNOSIS — Z7901 Long term (current) use of anticoagulants: Secondary | ICD-10-CM

## 2013-02-15 DIAGNOSIS — I2699 Other pulmonary embolism without acute cor pulmonale: Secondary | ICD-10-CM | POA: Diagnosis not present

## 2013-02-15 LAB — POCT INR: INR: 1.3

## 2013-02-22 ENCOUNTER — Encounter: Payer: Self-pay | Admitting: *Deleted

## 2013-02-22 ENCOUNTER — Ambulatory Visit (INDEPENDENT_AMBULATORY_CARE_PROVIDER_SITE_OTHER): Payer: Medicare Other | Admitting: *Deleted

## 2013-02-22 DIAGNOSIS — Z7901 Long term (current) use of anticoagulants: Secondary | ICD-10-CM

## 2013-02-22 DIAGNOSIS — I2699 Other pulmonary embolism without acute cor pulmonale: Secondary | ICD-10-CM

## 2013-02-22 LAB — POCT INR: INR: 1.5

## 2013-03-01 ENCOUNTER — Ambulatory Visit (INDEPENDENT_AMBULATORY_CARE_PROVIDER_SITE_OTHER): Payer: Medicare Other | Admitting: *Deleted

## 2013-03-01 DIAGNOSIS — I2699 Other pulmonary embolism without acute cor pulmonale: Secondary | ICD-10-CM

## 2013-03-01 DIAGNOSIS — M25569 Pain in unspecified knee: Secondary | ICD-10-CM | POA: Diagnosis not present

## 2013-03-01 DIAGNOSIS — Z7901 Long term (current) use of anticoagulants: Secondary | ICD-10-CM

## 2013-03-01 LAB — POCT INR: INR: 2.5

## 2013-03-08 ENCOUNTER — Ambulatory Visit (INDEPENDENT_AMBULATORY_CARE_PROVIDER_SITE_OTHER): Payer: Medicare Other | Admitting: *Deleted

## 2013-03-08 DIAGNOSIS — I2699 Other pulmonary embolism without acute cor pulmonale: Secondary | ICD-10-CM

## 2013-03-08 DIAGNOSIS — Z7901 Long term (current) use of anticoagulants: Secondary | ICD-10-CM

## 2013-03-08 LAB — POCT INR: INR: 3.3

## 2013-03-15 ENCOUNTER — Ambulatory Visit (INDEPENDENT_AMBULATORY_CARE_PROVIDER_SITE_OTHER): Payer: Medicare Other | Admitting: *Deleted

## 2013-03-15 DIAGNOSIS — I2699 Other pulmonary embolism without acute cor pulmonale: Secondary | ICD-10-CM | POA: Diagnosis not present

## 2013-03-15 DIAGNOSIS — Z7901 Long term (current) use of anticoagulants: Secondary | ICD-10-CM

## 2013-03-15 LAB — POCT INR: INR: 4.7

## 2013-03-18 DIAGNOSIS — I10 Essential (primary) hypertension: Secondary | ICD-10-CM | POA: Diagnosis not present

## 2013-03-18 DIAGNOSIS — M25569 Pain in unspecified knee: Secondary | ICD-10-CM | POA: Diagnosis not present

## 2013-03-24 ENCOUNTER — Ambulatory Visit (INDEPENDENT_AMBULATORY_CARE_PROVIDER_SITE_OTHER): Payer: Medicare Other | Admitting: *Deleted

## 2013-03-24 DIAGNOSIS — I2699 Other pulmonary embolism without acute cor pulmonale: Secondary | ICD-10-CM | POA: Diagnosis not present

## 2013-03-24 DIAGNOSIS — Z7901 Long term (current) use of anticoagulants: Secondary | ICD-10-CM

## 2013-03-30 DIAGNOSIS — IMO0002 Reserved for concepts with insufficient information to code with codable children: Secondary | ICD-10-CM | POA: Diagnosis not present

## 2013-03-30 DIAGNOSIS — M1710 Unilateral primary osteoarthritis, unspecified knee: Secondary | ICD-10-CM | POA: Diagnosis not present

## 2013-04-01 ENCOUNTER — Ambulatory Visit (INDEPENDENT_AMBULATORY_CARE_PROVIDER_SITE_OTHER): Payer: Medicare Other | Admitting: *Deleted

## 2013-04-01 DIAGNOSIS — Z7901 Long term (current) use of anticoagulants: Secondary | ICD-10-CM | POA: Diagnosis not present

## 2013-04-01 DIAGNOSIS — I2699 Other pulmonary embolism without acute cor pulmonale: Secondary | ICD-10-CM

## 2013-04-02 ENCOUNTER — Other Ambulatory Visit (HOSPITAL_COMMUNITY): Payer: Self-pay | Admitting: Family Medicine

## 2013-04-02 ENCOUNTER — Ambulatory Visit (HOSPITAL_COMMUNITY)
Admission: RE | Admit: 2013-04-02 | Discharge: 2013-04-02 | Disposition: A | Discharge status: Home / Self Care | Payer: Medicare Other | Source: Ambulatory Visit | Attending: Family Medicine | Admitting: Family Medicine

## 2013-04-02 DIAGNOSIS — K5900 Constipation, unspecified: Secondary | ICD-10-CM | POA: Diagnosis not present

## 2013-04-02 DIAGNOSIS — I10 Essential (primary) hypertension: Secondary | ICD-10-CM | POA: Diagnosis not present

## 2013-04-02 DIAGNOSIS — R197 Diarrhea, unspecified: Secondary | ICD-10-CM

## 2013-04-02 DIAGNOSIS — R195 Other fecal abnormalities: Secondary | ICD-10-CM | POA: Diagnosis not present

## 2013-04-02 DIAGNOSIS — R413 Other amnesia: Secondary | ICD-10-CM | POA: Diagnosis not present

## 2013-04-10 ENCOUNTER — Emergency Department (HOSPITAL_COMMUNITY)
Admission: EM | Admit: 2013-04-10 | Discharge: 2013-04-10 | Disposition: A | Discharge status: Home / Self Care | Payer: Medicare Other | Attending: Emergency Medicine | Admitting: Emergency Medicine

## 2013-04-10 ENCOUNTER — Emergency Department (HOSPITAL_COMMUNITY): Payer: Medicare Other

## 2013-04-10 ENCOUNTER — Encounter (HOSPITAL_COMMUNITY): Payer: Self-pay | Admitting: Emergency Medicine

## 2013-04-10 DIAGNOSIS — E785 Hyperlipidemia, unspecified: Secondary | ICD-10-CM | POA: Diagnosis not present

## 2013-04-10 DIAGNOSIS — Z87448 Personal history of other diseases of urinary system: Secondary | ICD-10-CM | POA: Insufficient documentation

## 2013-04-10 DIAGNOSIS — Z87891 Personal history of nicotine dependence: Secondary | ICD-10-CM | POA: Insufficient documentation

## 2013-04-10 DIAGNOSIS — I739 Peripheral vascular disease, unspecified: Secondary | ICD-10-CM | POA: Insufficient documentation

## 2013-04-10 DIAGNOSIS — N183 Chronic kidney disease, stage 3 unspecified: Secondary | ICD-10-CM | POA: Insufficient documentation

## 2013-04-10 DIAGNOSIS — Z951 Presence of aortocoronary bypass graft: Secondary | ICD-10-CM | POA: Insufficient documentation

## 2013-04-10 DIAGNOSIS — K5900 Constipation, unspecified: Secondary | ICD-10-CM

## 2013-04-10 DIAGNOSIS — Z86711 Personal history of pulmonary embolism: Secondary | ICD-10-CM | POA: Diagnosis not present

## 2013-04-10 DIAGNOSIS — J449 Chronic obstructive pulmonary disease, unspecified: Secondary | ICD-10-CM | POA: Insufficient documentation

## 2013-04-10 DIAGNOSIS — I2510 Atherosclerotic heart disease of native coronary artery without angina pectoris: Secondary | ICD-10-CM | POA: Diagnosis not present

## 2013-04-10 DIAGNOSIS — Z9889 Other specified postprocedural states: Secondary | ICD-10-CM | POA: Diagnosis not present

## 2013-04-10 DIAGNOSIS — J4489 Other specified chronic obstructive pulmonary disease: Secondary | ICD-10-CM | POA: Insufficient documentation

## 2013-04-10 DIAGNOSIS — Z8709 Personal history of other diseases of the respiratory system: Secondary | ICD-10-CM | POA: Diagnosis not present

## 2013-04-10 DIAGNOSIS — K219 Gastro-esophageal reflux disease without esophagitis: Secondary | ICD-10-CM | POA: Insufficient documentation

## 2013-04-10 DIAGNOSIS — I129 Hypertensive chronic kidney disease with stage 1 through stage 4 chronic kidney disease, or unspecified chronic kidney disease: Secondary | ICD-10-CM | POA: Insufficient documentation

## 2013-04-10 DIAGNOSIS — Z7901 Long term (current) use of anticoagulants: Secondary | ICD-10-CM | POA: Diagnosis not present

## 2013-04-10 DIAGNOSIS — Z862 Personal history of diseases of the blood and blood-forming organs and certain disorders involving the immune mechanism: Secondary | ICD-10-CM | POA: Diagnosis not present

## 2013-04-10 DIAGNOSIS — Z8679 Personal history of other diseases of the circulatory system: Secondary | ICD-10-CM | POA: Insufficient documentation

## 2013-04-10 LAB — CBC WITH DIFFERENTIAL/PLATELET
Basophils Absolute: 0 10*3/uL (ref 0.0–0.1)
Basophils Relative: 0 % (ref 0–1)
Eosinophils Absolute: 0 10*3/uL (ref 0.0–0.7)
Eosinophils Relative: 1 % (ref 0–5)
HCT: 36 % — ABNORMAL LOW (ref 39.0–52.0)
Hemoglobin: 12.8 g/dL — ABNORMAL LOW (ref 13.0–17.0)
Lymphocytes Relative: 22 % (ref 12–46)
Lymphs Abs: 1.1 10*3/uL (ref 0.7–4.0)
MCH: 30 pg (ref 26.0–34.0)
MCHC: 35.6 g/dL (ref 30.0–36.0)
MCV: 84.5 fL (ref 78.0–100.0)
Monocytes Absolute: 0.3 10*3/uL (ref 0.1–1.0)
Monocytes Relative: 6 % (ref 3–12)
Neutro Abs: 3.3 10*3/uL (ref 1.7–7.7)
Neutrophils Relative %: 71 % (ref 43–77)
Platelets: 161 10*3/uL (ref 150–400)
RBC: 4.26 MIL/uL (ref 4.22–5.81)
RDW: 14.8 % (ref 11.5–15.5)
WBC: 4.7 10*3/uL (ref 4.0–10.5)

## 2013-04-10 LAB — BASIC METABOLIC PANEL
BUN: 17 mg/dL (ref 6–23)
Chloride: 101 mEq/L (ref 96–112)
GFR calc Af Amer: 46 mL/min — ABNORMAL LOW (ref >90)
Potassium: 4.5 mEq/L (ref 3.5–5.1)

## 2013-04-10 LAB — PROTIME-INR
INR: 2.25 — ABNORMAL HIGH (ref 0.00–1.49)
Prothrombin Time: 23.9 s — ABNORMAL HIGH (ref 11.6–15.2)

## 2013-04-10 MED ORDER — POLYETHYLENE GLYCOL 3350 17 GM/SCOOP PO POWD: 17.0000 g | Freq: Two times a day (BID) | ORAL | Status: DC

## 2013-04-10 MED ORDER — FLEET ENEMA 7-19 GM/118ML RE ENEM: 1.0000 | ENEMA | Freq: Once | RECTAL | Status: DC

## 2013-04-10 NOTE — ED Notes (Signed)
Fleet enema given, patient tolerated well

## 2013-04-10 NOTE — ED Notes (Signed)
Spouses blood sugar taken after eating snacks provided by staff, blood sugar was 215, edit sheet sent to POC.

## 2013-04-10 NOTE — ED Notes (Signed)
Pt and wife states pt has had only one bm in past "month" and that was with medication 4 days ago. Pt states has had intermittent very small bm's over the last month.has been talking with DR Loleta Chance and had been giving med. Pt alert/oriented. Denies pain/n/v. Denies black or bloody stools. abd is soft and nontender. Denies urinary changes.

## 2013-04-10 NOTE — ED Provider Notes (Signed)
History    This chart was scribed for Carleene Cooper III, MD by Sofie Rower, ED Scribe. The patient was seen in room [facility] and the patient's care was started at 1:24PM.    CSN: [id number]  Arrival date & time 04/10/13  1051   None     Chief Complaint  Patient presents with  . Constipation    (Consider location/radiation/quality/duration/timing/severity/associated sxs/prior treatment) The history is provided by the patient and the spouse. No language interpreter was used.    Lance Grant is a 79 y.o. male , with a hx of hypertension, COPD, pleural effusion, CKD stage III, seminal vesiculitis, GERD, hyperlipidemia, PE, chronic anticoagulation, CABG (Performed in 10/2001) and TURP (Performed on 07/2005), who presents to the Emergency Department complaining of  gradual, progressively worsening, constipation, onset four weeks ago. The pt reports he has generated one, baseline, bowel movement within the last four weeks, which has prompted his as well as his wife's concern and desire to seek medical treatment at [facility] this afternoon (04/10/13). The pt does admit he has generated intermittent, small, bowel movements over the past month, however, not enough to relieve his current sensation of constipation. The pt has taken miralax, which does not provide relief of his constipation. Furthermore, the pt is currently taking coumadin and statins.  The pt is a former smoker (quit on 11/05/1987), however, he does drink alcohol daily.   PCP is Dr. Loleta Chance.    Past Medical History  Diagnosis Date  . Hypertension   . COPD (chronic obstructive pulmonary disease)   . Pleural effusion   . Chronic kidney disease, stage III (moderate)     Creatinine-1.9 08/2005, 1.5 02/2007, 1.7 12/2007, 1.7 06/2008  . Seminal vesiculitis   . GERD (gastroesophageal reflux disease)   . Hyperlipidemia   . Sinus bradycardia     When on beta blockers  . Arteriosclerotic cardiovascular disease (ASCVD)     CABG 10/2001 nl EF   . Peripheral vascular disease   . Pulmonary embolism 2007    2007  . Chronic anticoagulation     Managed by [facility]    Past Surgical History  Procedure Laterality Date  . Coronary artery bypass graft  10/2001  . Transurethral resection of prostate  07/2005    History reviewed. No pertinent family history.  History  Substance Use Topics  . Smoking status: Former Smoker -- 1.00 packs/day for 10 years    Quit date: 11/05/1987  . Smokeless tobacco: Not on file  . Alcohol Use: 4.2 oz/week    7 Cans of beer per week     Comment: 1 can beer/day      Review of Systems  Gastrointestinal: Positive for constipation.  All other systems reviewed and are negative.    Allergies  Erythromycin  Home Medications   Current Outpatient Rx  Name  Route  Sig  Dispense  Refill  . donepezil (ARICEPT) 5 MG tablet   Oral   Take 5 mg by mouth daily.         Marland Kitchen warfarin (COUMADIN) 2 MG tablet   Oral   Take 4 mg by mouth daily.          . traMADol (ULTRAM) 50 MG tablet   Oral   Take 50 mg by mouth every 6 (six) hours as needed. Maximum dose= 8 tablets per day            BP 156/80  Pulse 63  Temp(Src) 98.7 F (37.1 C) (Oral)  Resp 18  Ht 5\' 9"  (1.753 m)  Wt 175 lb (79.379 kg)  BMI 25.83 kg/m2  SpO2 98%  Physical Exam  Nursing note and vitals reviewed. Constitutional: He is oriented to person, place, and time. He appears well-developed and well-nourished. No distress.  HENT:  Head: Normocephalic and atraumatic.  Mouth/Throat: Oropharynx is clear and moist.  Eyes: Conjunctivae and EOM are normal. Pupils are equal, round, and reactive to light.  Neck: Neck supple. No tracheal deviation present.  Cardiovascular: Normal rate, regular rhythm and normal heart sounds.  Exam reveals no gallop and no friction rub.   No murmur heard. Pulmonary/Chest: Effort normal and breath sounds normal. No respiratory distress.  Abdominal: Soft. Bowel sounds are normal. He exhibits no  distension. There is no tenderness.  Genitourinary:  Fair amount of hemoccult negative stool present in rectum.   Musculoskeletal: Normal range of motion. He exhibits no edema.  Neurological: He is alert and oriented to person, place, and time. No sensory deficit.  Skin: Skin is warm and dry.  Psychiatric: He has a normal mood and affect. His behavior is normal.    ED Course  Procedures (including critical care time)  DIAGNOSTIC STUDIES: Oxygen Saturation is 98% on room air, normal by my interpretation.    COORDINATION OF CARE:  1:33 PM- Treatment plan discussed with patient in addition to pt's spouse. Pt as well as pt's spouse agree with treatment.  1:34 PM- Rectal exam evaluating possible fecal impaction conducted. Chaperone present. Pt agrees with treatment. Stool present. Will administer enema.       Results for orders placed during the hospital encounter of 04/10/13  CBC WITH DIFFERENTIAL      Result Value Range   WBC 4.7  4.0 - 10.5 K/uL   RBC 4.26  4.22 - 5.81 MIL/uL   Hemoglobin 12.8 (*) 13.0 - 17.0 g/dL   HCT 62.9 (*) 52.8 - 41.3 %   MCV 84.5  78.0 - 100.0 fL   MCH 30.0  26.0 - 34.0 pg   MCHC 35.6  30.0 - 36.0 g/dL   RDW 24.4  01.0 - 27.2 %   Platelets 161  150 - 400 K/uL   Neutrophils Relative % 71  43 - 77 %   Neutro Abs 3.3  1.7 - 7.7 K/uL   Lymphocytes Relative 22  12 - 46 %   Lymphs Abs 1.1  0.7 - 4.0 K/uL   Monocytes Relative 6  3 - 12 %   Monocytes Absolute 0.3  0.1 - 1.0 K/uL   Eosinophils Relative 1  0 - 5 %   Eosinophils Absolute 0.0  0.0 - 0.7 K/uL   Basophils Relative 0  0 - 1 %   Basophils Absolute 0.0  0.0 - 0.1 K/uL  BASIC METABOLIC PANEL      Result Value Range   Sodium 138  135 - 145 mEq/L   Potassium 4.5  3.5 - 5.1 mEq/L   Chloride 101  96 - 112 mEq/L   CO2 29  19 - 32 mEq/L   Glucose, Bld 89  70 - 99 mg/dL   BUN 17  6 - 23 mg/dL   Creatinine, Ser 5.36 (*) 0.50 - 1.35 mg/dL   Calcium 9.6  8.4 - 64.4 mg/dL   GFR calc non Af Amer 40 (*)  >90 mL/min   GFR calc Af Amer 46 (*) >90 mL/min  PROTIME-INR      Result Value Range   Prothrombin Time 23.9 (*) 11.6 - 15.2 seconds  INR 2.25 (*) 0.00 - 1.49    Dg Abd Acute W/chest  04/10/2013   *RADIOLOGY REPORT*  Clinical Data: Constipation  ACUTE ABDOMEN SERIES (ABDOMEN 2 VIEW & CHEST 1 VIEW)  Comparison: Abdominal radiographs dated 04/02/2013  Findings: Mild left basilar opacity, likely atelectasis.  No focal consolidation. No pleural effusion or pneumothorax.  The heart is normal in size. Postsurgical changes related to prior CABG.  Nonobstructive bowel gas pattern.  Moderate colonic stool burden in the right colon and rectum.  No evidence of free air under the diaphragm on the upright view.  Degenerative changes of the visualized thoracolumbar spine with S- shaped thoracolumbar scoliosis.  IMPRESSION: No evidence of acute cardiopulmonary disease.  No evidence of small bowel obstruction or free air.  Moderate colonic stool burden, suggesting constipation.   Original Report Authenticated By: Charline Bills, M.D.     2:43 PM Pt had a small stool post enema.  Advised to take Miralax 17 grams po in a glass of water twice a day, every day, to treat his constipation.  1. Constipation    I personally performed the services described in this documentation, which was scribed in my presence. The recorded information has been reviewed and is accurate.  Osvaldo Human, MD      Carleene Cooper III, MD 04/10/13 [id number]

## 2013-04-10 NOTE — ED Notes (Signed)
Passed some formed stool

## 2013-04-16 DIAGNOSIS — M25569 Pain in unspecified knee: Secondary | ICD-10-CM | POA: Diagnosis not present

## 2013-04-16 DIAGNOSIS — I10 Essential (primary) hypertension: Secondary | ICD-10-CM | POA: Diagnosis not present

## 2013-04-19 ENCOUNTER — Ambulatory Visit (INDEPENDENT_AMBULATORY_CARE_PROVIDER_SITE_OTHER): Payer: Medicare Other | Admitting: *Deleted

## 2013-04-19 DIAGNOSIS — Z7901 Long term (current) use of anticoagulants: Secondary | ICD-10-CM

## 2013-04-19 DIAGNOSIS — I2699 Other pulmonary embolism without acute cor pulmonale: Secondary | ICD-10-CM

## 2013-04-19 LAB — POCT INR: INR: 1.7

## 2013-05-03 ENCOUNTER — Ambulatory Visit (INDEPENDENT_AMBULATORY_CARE_PROVIDER_SITE_OTHER): Payer: Medicare Other | Admitting: *Deleted

## 2013-05-03 DIAGNOSIS — Z7901 Long term (current) use of anticoagulants: Secondary | ICD-10-CM | POA: Diagnosis not present

## 2013-05-03 DIAGNOSIS — I2699 Other pulmonary embolism without acute cor pulmonale: Secondary | ICD-10-CM

## 2013-05-03 LAB — POCT INR: INR: 1.1

## 2013-05-07 ENCOUNTER — Encounter: Payer: Self-pay | Admitting: Cardiology

## 2013-05-10 ENCOUNTER — Telehealth: Payer: Self-pay | Admitting: *Deleted

## 2013-05-10 ENCOUNTER — Ambulatory Visit (INDEPENDENT_AMBULATORY_CARE_PROVIDER_SITE_OTHER): Payer: Medicare Other | Admitting: *Deleted

## 2013-05-10 DIAGNOSIS — I2699 Other pulmonary embolism without acute cor pulmonale: Secondary | ICD-10-CM

## 2013-05-10 DIAGNOSIS — Z7901 Long term (current) use of anticoagulants: Secondary | ICD-10-CM | POA: Diagnosis not present

## 2013-05-10 NOTE — Telephone Encounter (Signed)
Pt wants to continue with warfarin since he gets his meds from the [place].

## 2013-05-10 NOTE — Telephone Encounter (Signed)
Message copied by Kyung Rudd on Mon May 10, 2013  3:38 PM ------      Message from: Kathlen Brunswick      Created: Fri May 07, 2013 12:01 PM       He has been pretty good up to now. Has there been any change in medication or diet?Marland Kitchen You can continue to try with warfarin for a while if you would like or change to dabigatran 150 mg twice a day.            Thanks,      Rob                  ----- Message -----         From: Louanna Raw, RN         Sent: 05/03/2013   9:02 AM           To: Kathlen Brunswick, MD            Please review INR's .  Pt's levels are all over the place.  Question compliance even though he says he is taking meds.  Can he come off coumadin at this point?       ------

## 2013-05-13 ENCOUNTER — Ambulatory Visit (INDEPENDENT_AMBULATORY_CARE_PROVIDER_SITE_OTHER): Payer: Medicare Other | Admitting: *Deleted

## 2013-05-13 DIAGNOSIS — Z7901 Long term (current) use of anticoagulants: Secondary | ICD-10-CM

## 2013-05-13 DIAGNOSIS — I2699 Other pulmonary embolism without acute cor pulmonale: Secondary | ICD-10-CM

## 2013-05-14 DIAGNOSIS — K5900 Constipation, unspecified: Secondary | ICD-10-CM | POA: Diagnosis not present

## 2013-05-17 ENCOUNTER — Ambulatory Visit (INDEPENDENT_AMBULATORY_CARE_PROVIDER_SITE_OTHER): Payer: Medicare Other | Admitting: *Deleted

## 2013-05-17 DIAGNOSIS — Z7901 Long term (current) use of anticoagulants: Secondary | ICD-10-CM

## 2013-05-17 DIAGNOSIS — I2699 Other pulmonary embolism without acute cor pulmonale: Secondary | ICD-10-CM

## 2013-05-17 LAB — POCT INR: INR: 3.7

## 2013-05-21 ENCOUNTER — Ambulatory Visit (INDEPENDENT_AMBULATORY_CARE_PROVIDER_SITE_OTHER): Payer: Medicare Other | Admitting: Cardiovascular Disease

## 2013-05-21 ENCOUNTER — Encounter: Payer: Self-pay | Admitting: Cardiovascular Disease

## 2013-05-21 VITALS — BP 155/96 | HR 80 | Ht 68.0 in | Wt 152.2 lb

## 2013-05-21 DIAGNOSIS — E785 Hyperlipidemia, unspecified: Secondary | ICD-10-CM | POA: Diagnosis not present

## 2013-05-21 DIAGNOSIS — I10 Essential (primary) hypertension: Secondary | ICD-10-CM | POA: Diagnosis not present

## 2013-05-21 DIAGNOSIS — I2699 Other pulmonary embolism without acute cor pulmonale: Secondary | ICD-10-CM | POA: Diagnosis not present

## 2013-05-21 DIAGNOSIS — I25810 Atherosclerosis of coronary artery bypass graft(s) without angina pectoris: Secondary | ICD-10-CM | POA: Diagnosis not present

## 2013-05-21 MED ORDER — LISINOPRIL 10 MG PO TABS: 10.0000 mg | ORAL_TABLET | Freq: Every day | ORAL | Status: DC

## 2013-05-21 MED ORDER — SIMVASTATIN 20 MG PO TABS: 20.0000 mg | ORAL_TABLET | Freq: Every day | ORAL | Status: AC

## 2013-05-21 MED ORDER — SIMVASTATIN 40 MG PO TABS: 40.0000 mg | ORAL_TABLET | Freq: Every day | ORAL | Status: DC

## 2013-05-21 MED ORDER — NITROGLYCERIN 0.4 MG/SPRAY TL SOLN: 1.0000 | Status: DC | PRN

## 2013-05-21 MED ORDER — ATENOLOL 25 MG PO TABS: 25.0000 mg | ORAL_TABLET | Freq: Every day | ORAL | Status: DC

## 2013-05-21 NOTE — Patient Instructions (Addendum)
Your physician recommends that you schedule a follow-up appointment in: 6 MONTHS WITH Dr. Purvis Sheffield   Your physician has recommended you make the following change in your medication:   1) START TAKING ATENOLOL 25MG  ONCE DAILY 2) START TAKING LISINOPRIL 10MG  ONCE DAILY 3) START TAKING SIMVASTATIN 40MG  ONCE DAILY 4) START TAKING NITRO SPRAY AS NEEDED FOR CHEST PAIN ( The proper use and anticipated side effects of nitroglycerine has been carefully explained.  If a single episode of chest pain is not relieved by one SPRAY, the patient will try another SPRAY within 5 minutes; and if this doesn't relieve the pain, the patient is instructed to call 911 for transportation to an emergency department.

## 2013-05-21 NOTE — Progress Notes (Signed)
Patient ID: Lance Grant, male   DOB: 1921-09-05, 79 y.o.   MRN: [id number]    SUBJECTIVE: Lance Grant is a pleasant 79 y.o. Man with a PMH significant for pulmonary embolism, CAD s/p CABG, HTN, hyperlipidemia, CKD stage III, and GERD.  He is no longer on Lisinopril, Atenolol, Candesartan, Lasix, and Simvastatin for unclear reasons. He says he never stopped any of his meds on his own, and it appears his PCP didn't stop them either.  He denies chest pain and shortness of breath, as well as palpitations and leg swelling. He feels well. He has exercise equipment at home which he uses, and has a computer to play games on.  He grew up in [place], and came to [facility] after he retired from the service. He was initially in the [employer], and then was in [employer]. He attended [employer].  He is a retired Programme researcher, broadcasting/film/video for [employer], and never lost an Teacher, English as a foreign language in the 20 years he served.  BP: 155/96 mmHg HR: 80 bpm  PHYSICAL EXAM General: NAD Neck: No JVD, no thyromegaly or thyroid nodule.  Lungs: Clear to auscultation bilaterally with normal respiratory effort. CV: Nondisplaced PMI.  Heart regular S1/S2, no S3/S4, no murmur.  No peripheral edema.  No carotid bruit.  Normal pedal pulses.  Abdomen: Soft, nontender, no hepatosplenomegaly, no distention.  Neurologic: Alert and oriented x 3.  Psych: Normal affect. Extremities: No clubbing or cyanosis.    LABS: Basic Metabolic Panel: No results found for this basename: NA, K, CL, CO2, GLUCOSE, BUN, CREATININE, CALCIUM, MG, PHOS,  in the last 72 hours Liver Function Tests: No results found for this basename: AST, ALT, ALKPHOS, BILITOT, PROT, ALBUMIN,  in the last 72 hours No results found for this basename: LIPASE, AMYLASE,  in the last 72 hours CBC: No results found for this basename: WBC, NEUTROABS, HGB, HCT, MCV, PLT,  in the last 72 hours Cardiac Enzymes: No results found for this basename: CKTOTAL, CKMB,  CKMBINDEX, TROPONINI,  in the last 72 hours BNP: No components found with this basename: POCBNP,  D-Dimer: No results found for this basename: DDIMER,  in the last 72 hours Hemoglobin A1C: No results found for this basename: HGBA1C,  in the last 72 hours Fasting Lipid Panel: No results found for this basename: CHOL, HDL, LDLCALC, TRIG, CHOLHDL, LDLDIRECT,  in the last 72 hours Thyroid Function Tests: No results found for this basename: TSH, T4TOTAL, FREET3, T3FREE, THYROIDAB,  in the last 72 hours Anemia Panel: No results found for this basename: VITAMINB12, FOLATE, FERRITIN, TIBC, IRON, RETICCTPCT,  in the last 72 hours  ECG: NSR, 85 bpm, nonspecific T wave abnormality    ASSESSMENT AND PLAN: 1. CAD s/p CABG: I will restart his Atenolol and Simvastatin. Will hold off on ASA. He is asymptomatic and stable from this standpoint. 2. Pulmonary Embolism: he is on Warfarin. 3. HTN: uncontrolled today, and will thus restart Lisinopril. 4. Hyperlipidemia: will restart Simvastatin.    Prentice Docker, M.D., F.A.C.C.

## 2013-05-21 NOTE — Addendum Note (Signed)
Addended by: Derry Lory A on: 05/21/2013 02:42 PM   Modules accepted: Orders

## 2013-05-24 ENCOUNTER — Ambulatory Visit (INDEPENDENT_AMBULATORY_CARE_PROVIDER_SITE_OTHER): Payer: Medicare Other | Admitting: *Deleted

## 2013-05-24 DIAGNOSIS — Z7901 Long term (current) use of anticoagulants: Secondary | ICD-10-CM | POA: Diagnosis not present

## 2013-05-24 DIAGNOSIS — I2699 Other pulmonary embolism without acute cor pulmonale: Secondary | ICD-10-CM

## 2013-06-01 DIAGNOSIS — I10 Essential (primary) hypertension: Secondary | ICD-10-CM | POA: Diagnosis not present

## 2013-06-01 DIAGNOSIS — M25569 Pain in unspecified knee: Secondary | ICD-10-CM | POA: Diagnosis not present

## 2013-06-07 ENCOUNTER — Ambulatory Visit (INDEPENDENT_AMBULATORY_CARE_PROVIDER_SITE_OTHER): Payer: Medicare Other | Admitting: *Deleted

## 2013-06-07 DIAGNOSIS — I2699 Other pulmonary embolism without acute cor pulmonale: Secondary | ICD-10-CM | POA: Diagnosis not present

## 2013-06-07 DIAGNOSIS — Z7901 Long term (current) use of anticoagulants: Secondary | ICD-10-CM

## 2013-06-15 ENCOUNTER — Other Ambulatory Visit: Payer: Self-pay | Admitting: Cardiology

## 2013-06-16 ENCOUNTER — Telehealth: Payer: Self-pay | Admitting: *Deleted

## 2013-06-16 NOTE — Telephone Encounter (Signed)
Refill on Warfarin/it is in system / tgs

## 2013-06-17 MED ORDER — WARFARIN SODIUM 2 MG PO TABS: ORAL_TABLET | ORAL | Status: DC

## 2013-06-17 NOTE — Telephone Encounter (Signed)
Medication sent via escribe.  

## 2013-06-28 ENCOUNTER — Ambulatory Visit (INDEPENDENT_AMBULATORY_CARE_PROVIDER_SITE_OTHER): Payer: Medicare Other | Admitting: *Deleted

## 2013-06-28 DIAGNOSIS — I2699 Other pulmonary embolism without acute cor pulmonale: Secondary | ICD-10-CM | POA: Diagnosis not present

## 2013-06-28 DIAGNOSIS — Z7901 Long term (current) use of anticoagulants: Secondary | ICD-10-CM | POA: Diagnosis not present

## 2013-06-28 LAB — POCT INR: INR: 2.6

## 2013-07-12 DIAGNOSIS — M25569 Pain in unspecified knee: Secondary | ICD-10-CM | POA: Diagnosis not present

## 2013-07-12 DIAGNOSIS — I10 Essential (primary) hypertension: Secondary | ICD-10-CM | POA: Diagnosis not present

## 2013-07-19 ENCOUNTER — Ambulatory Visit (INDEPENDENT_AMBULATORY_CARE_PROVIDER_SITE_OTHER): Payer: Medicare Other | Admitting: *Deleted

## 2013-07-19 DIAGNOSIS — I2699 Other pulmonary embolism without acute cor pulmonale: Secondary | ICD-10-CM | POA: Diagnosis not present

## 2013-07-19 DIAGNOSIS — Z7901 Long term (current) use of anticoagulants: Secondary | ICD-10-CM

## 2013-08-04 ENCOUNTER — Ambulatory Visit (INDEPENDENT_AMBULATORY_CARE_PROVIDER_SITE_OTHER): Payer: Medicare Other | Admitting: *Deleted

## 2013-08-04 DIAGNOSIS — I2699 Other pulmonary embolism without acute cor pulmonale: Secondary | ICD-10-CM

## 2013-08-04 DIAGNOSIS — Z7901 Long term (current) use of anticoagulants: Secondary | ICD-10-CM | POA: Diagnosis not present

## 2013-08-04 LAB — POCT INR: INR: 2

## 2013-08-23 DIAGNOSIS — I10 Essential (primary) hypertension: Secondary | ICD-10-CM | POA: Diagnosis not present

## 2013-08-23 DIAGNOSIS — M25569 Pain in unspecified knee: Secondary | ICD-10-CM | POA: Diagnosis not present

## 2013-08-25 ENCOUNTER — Ambulatory Visit (INDEPENDENT_AMBULATORY_CARE_PROVIDER_SITE_OTHER): Payer: Medicare Other | Admitting: *Deleted

## 2013-08-25 DIAGNOSIS — I2699 Other pulmonary embolism without acute cor pulmonale: Secondary | ICD-10-CM

## 2013-08-25 DIAGNOSIS — Z7901 Long term (current) use of anticoagulants: Secondary | ICD-10-CM | POA: Diagnosis not present

## 2013-08-25 LAB — POCT INR: INR: 5

## 2013-09-02 ENCOUNTER — Ambulatory Visit (INDEPENDENT_AMBULATORY_CARE_PROVIDER_SITE_OTHER): Payer: Medicare Other | Admitting: *Deleted

## 2013-09-02 DIAGNOSIS — I2699 Other pulmonary embolism without acute cor pulmonale: Secondary | ICD-10-CM

## 2013-09-02 DIAGNOSIS — Z7901 Long term (current) use of anticoagulants: Secondary | ICD-10-CM | POA: Diagnosis not present

## 2013-09-02 LAB — POCT INR: INR: 3.1

## 2013-09-16 ENCOUNTER — Ambulatory Visit (INDEPENDENT_AMBULATORY_CARE_PROVIDER_SITE_OTHER): Payer: Medicare Other | Admitting: *Deleted

## 2013-09-16 DIAGNOSIS — Z7901 Long term (current) use of anticoagulants: Secondary | ICD-10-CM | POA: Diagnosis not present

## 2013-09-16 DIAGNOSIS — I2699 Other pulmonary embolism without acute cor pulmonale: Secondary | ICD-10-CM

## 2013-09-16 LAB — POCT INR: INR: 3.8

## 2013-09-18 DIAGNOSIS — R609 Edema, unspecified: Secondary | ICD-10-CM | POA: Diagnosis not present

## 2013-09-18 DIAGNOSIS — Z7901 Long term (current) use of anticoagulants: Secondary | ICD-10-CM | POA: Diagnosis not present

## 2013-10-04 DIAGNOSIS — I10 Essential (primary) hypertension: Secondary | ICD-10-CM | POA: Diagnosis not present

## 2013-10-04 DIAGNOSIS — M25569 Pain in unspecified knee: Secondary | ICD-10-CM | POA: Diagnosis not present

## 2013-10-07 ENCOUNTER — Ambulatory Visit (INDEPENDENT_AMBULATORY_CARE_PROVIDER_SITE_OTHER): Payer: Medicare Other | Admitting: *Deleted

## 2013-10-07 DIAGNOSIS — Z7901 Long term (current) use of anticoagulants: Secondary | ICD-10-CM | POA: Diagnosis not present

## 2013-10-07 DIAGNOSIS — I2699 Other pulmonary embolism without acute cor pulmonale: Secondary | ICD-10-CM

## 2013-11-03 ENCOUNTER — Ambulatory Visit (INDEPENDENT_AMBULATORY_CARE_PROVIDER_SITE_OTHER): Payer: Medicare Other | Admitting: *Deleted

## 2013-11-03 DIAGNOSIS — I2699 Other pulmonary embolism without acute cor pulmonale: Secondary | ICD-10-CM

## 2013-11-03 DIAGNOSIS — Z7901 Long term (current) use of anticoagulants: Secondary | ICD-10-CM | POA: Diagnosis not present

## 2013-11-07 ENCOUNTER — Other Ambulatory Visit: Payer: Self-pay | Admitting: Cardiology

## 2013-11-15 DIAGNOSIS — I10 Essential (primary) hypertension: Secondary | ICD-10-CM | POA: Diagnosis not present

## 2013-11-15 DIAGNOSIS — M25569 Pain in unspecified knee: Secondary | ICD-10-CM | POA: Diagnosis not present

## 2013-12-01 ENCOUNTER — Ambulatory Visit (INDEPENDENT_AMBULATORY_CARE_PROVIDER_SITE_OTHER): Payer: Medicare Other | Admitting: *Deleted

## 2013-12-01 DIAGNOSIS — Z7901 Long term (current) use of anticoagulants: Secondary | ICD-10-CM

## 2013-12-01 DIAGNOSIS — I2699 Other pulmonary embolism without acute cor pulmonale: Secondary | ICD-10-CM

## 2013-12-01 DIAGNOSIS — Z5181 Encounter for therapeutic drug level monitoring: Secondary | ICD-10-CM

## 2013-12-01 LAB — POCT INR: INR: 2.2

## 2014-01-05 ENCOUNTER — Ambulatory Visit (INDEPENDENT_AMBULATORY_CARE_PROVIDER_SITE_OTHER): Payer: Medicare Other | Admitting: *Deleted

## 2014-01-05 DIAGNOSIS — I2699 Other pulmonary embolism without acute cor pulmonale: Secondary | ICD-10-CM | POA: Diagnosis not present

## 2014-01-05 DIAGNOSIS — Z7901 Long term (current) use of anticoagulants: Secondary | ICD-10-CM

## 2014-01-05 DIAGNOSIS — Z5181 Encounter for therapeutic drug level monitoring: Secondary | ICD-10-CM

## 2014-01-05 LAB — POCT INR: INR: 2.3

## 2014-01-06 DIAGNOSIS — I10 Essential (primary) hypertension: Secondary | ICD-10-CM | POA: Diagnosis not present

## 2014-01-06 DIAGNOSIS — M25569 Pain in unspecified knee: Secondary | ICD-10-CM | POA: Diagnosis not present

## 2014-01-08 DIAGNOSIS — R319 Hematuria, unspecified: Secondary | ICD-10-CM | POA: Diagnosis not present

## 2014-01-08 DIAGNOSIS — I2510 Atherosclerotic heart disease of native coronary artery without angina pectoris: Secondary | ICD-10-CM | POA: Diagnosis not present

## 2014-01-08 DIAGNOSIS — I10 Essential (primary) hypertension: Secondary | ICD-10-CM | POA: Diagnosis not present

## 2014-01-17 ENCOUNTER — Ambulatory Visit (INDEPENDENT_AMBULATORY_CARE_PROVIDER_SITE_OTHER): Payer: Medicare Other | Admitting: Adult Health

## 2014-01-17 ENCOUNTER — Encounter: Payer: Self-pay | Admitting: Adult Health

## 2014-01-17 VITALS — BP 138/72 | HR 62 | Ht 69.0 in | Wt 168.0 lb

## 2014-01-17 DIAGNOSIS — I7090 Unspecified atherosclerosis: Secondary | ICD-10-CM

## 2014-01-17 DIAGNOSIS — I2510 Atherosclerotic heart disease of native coronary artery without angina pectoris: Secondary | ICD-10-CM

## 2014-01-17 DIAGNOSIS — I10 Essential (primary) hypertension: Secondary | ICD-10-CM | POA: Diagnosis not present

## 2014-01-17 DIAGNOSIS — I2699 Other pulmonary embolism without acute cor pulmonale: Secondary | ICD-10-CM | POA: Diagnosis not present

## 2014-01-17 MED ORDER — HYDROCHLOROTHIAZIDE 12.5 MG PO CAPS: 12.5000 mg | ORAL_CAPSULE | ORAL | Status: DC

## 2014-01-17 NOTE — Assessment & Plan Note (Signed)
Blood pressure is well-controlled. He is not avoiding salt. He is tolerating this medication so without difficulty. I started her on HCTZ 12.5 mg every other day. He is to weigh daily, has been placed on a 1500 mg low-sodium diet. He will have a BMET in one month.

## 2014-01-17 NOTE — Assessment & Plan Note (Signed)
He denies recurrent chest pain, worsening shortness of breath, or weakness. No planned cardiac testing at this time.

## 2014-01-17 NOTE — Progress Notes (Deleted)
Name: Lance Grant    DOB: 26-Jan-1921  Age: 80 y.o.  MR#: [id number]       PCP:  Maggie Font, MD      Insurance: Payor: MEDICARE / Plan: MEDICARE PART A AND B / Product Type: *No Product type* /   CC:    Chief Complaint  Patient presents with  . Coronary Artery Disease  . Hypertension    VS Filed Vitals:   01/17/14 1538  BP: 138/72  Pulse: 62  Height: 5' 9"  (1.753 m)  Weight: 168 lb (76.204 kg)  SpO2: 100%    Weights Current Weight  01/17/14 168 lb (76.204 kg)  05/21/13 152 lb 4 oz (69.06 kg)  04/10/13 175 lb (79.379 kg)    Blood Pressure  BP Readings from Last 3 Encounters:  01/17/14 138/72  05/21/13 155/96  04/10/13 156/80     Admit date:  (Not on file) Last encounter with RMR:  Visit date not found   Allergy Erythromycin  Current Outpatient Prescriptions  Medication Sig Dispense Refill  . atenolol (TENORMIN) 25 MG tablet Take 1 tablet (25 mg total) by mouth daily.  90 tablet  1  . donepezil (ARICEPT) 5 MG tablet Take 5 mg by mouth daily.      Marland Kitchen lisinopril (PRINIVIL,ZESTRIL) 10 MG tablet Take 1 tablet (10 mg total) by mouth daily.  90 tablet  1  . loratadine (CLARITIN) 10 MG tablet Take 10 mg by mouth daily.      . nitroGLYCERIN (NITROLINGUAL) 0.4 MG/SPRAY spray Place 1 spray under the tongue every 5 (five) minutes as needed for chest pain.  12 g  12  . polyethylene glycol powder (GLYCOLAX/MIRALAX) powder Take 17 g by mouth 2 (two) times daily.  255 g  0  . simvastatin (ZOCOR) 20 MG tablet Take 1 tablet (20 mg total) by mouth at bedtime.  90 tablet  1  . traMADol (ULTRAM) 50 MG tablet Take 50 mg by mouth every 6 (six) hours as needed. Maximum dose= 8 tablets per day       . warfarin (COUMADIN) 2 MG tablet Take 4 mg by mouth daily.       Marland Kitchen warfarin (COUMADIN) 2 MG tablet Take 3 tablets daily to equal 6 mg daily and as directed.  90 tablet  3  . warfarin (COUMADIN) 2 MG tablet TAKE 2 TABLETS DAILY EXCEPT 3 TABLETS ON MONDAYS, WEDNESDAYS AND FRIDAYS  90 tablet  3    No current facility-administered medications for this visit.    Discontinued Meds:   There are no discontinued medications.  Patient Active Problem List   Diagnosis Date Noted  . Encounter for therapeutic drug monitoring 12/01/2013  . Hypertension   . COPD (chronic obstructive pulmonary disease)   . Chronic kidney disease, stage III (moderate)   . GERD (gastroesophageal reflux disease)   . Hyperlipidemia   . Arteriosclerotic cardiovascular disease (ASCVD)   . Pulmonary embolism   . Chronic anticoagulation     LABS    Component Value Date/Time   NA 138 04/10/2013 1145   NA 140 09/15/2012 1020   NA 139 02/22/2011 1400   K 4.5 04/10/2013 1145   K 5.3 09/15/2012 1020   K 4.6 02/22/2011 1400   CL 101 04/10/2013 1145   CL 104 09/15/2012 1020   CL 108 02/22/2011 1400   CO2 29 04/10/2013 1145   CO2 29 09/15/2012 1020   CO2 28 02/22/2011 1400   GLUCOSE 89 04/10/2013 1145   GLUCOSE  96 09/15/2012 1020   GLUCOSE 92 02/22/2011 1400   BUN 17 04/10/2013 1145   BUN 27* 09/15/2012 1020   BUN 14 02/22/2011 1400   CREATININE 1.48* 04/10/2013 1145   CREATININE 1.57* 09/15/2012 1020   CREATININE 1.49 02/22/2011 1400   CALCIUM 9.6 04/10/2013 1145   CALCIUM 9.9 09/15/2012 1020   CALCIUM 9.1 02/22/2011 1400   GFRNONAA 40* 04/10/2013 1145   GFRNONAA 45* 02/22/2011 1400   GFRAA 46* 04/10/2013 1145   GFRAA  Value: 54        The eGFR has been calculated using the MDRD equation. This calculation has not been validated in all clinical situations. eGFR's persistently <60 mL/min signify possible Chronic Kidney Disease.* 02/22/2011 1400   CMP     Component Value Date/Time   NA 138 04/10/2013 1145   K 4.5 04/10/2013 1145   CL 101 04/10/2013 1145   CO2 29 04/10/2013 1145   GLUCOSE 89 04/10/2013 1145   BUN 17 04/10/2013 1145   CREATININE 1.48* 04/10/2013 1145   CREATININE 1.57* 09/15/2012 1020   CALCIUM 9.6 04/10/2013 1145   PROT 6.2 09/15/2012 1020   ALBUMIN 4.0 09/15/2012 1020   AST 21 09/15/2012 1020   ALT 15 09/15/2012 1020    ALKPHOS 52 09/15/2012 1020   BILITOT 0.6 09/15/2012 1020   GFRNONAA 40* 04/10/2013 1145   GFRAA 46* 04/10/2013 1145       Component Value Date/Time   WBC 4.7 04/10/2013 1145   WBC 6.6 09/17/2012 1222   HGB 12.8* 04/10/2013 1145   HGB 12.7* 09/17/2012 1222   HGB 12.1* 02/22/2011 1400   HCT 36.0* 04/10/2013 1145   HCT 37.1* 09/17/2012 1222   HCT 35.8* 02/22/2011 1400   MCV 84.5 04/10/2013 1145   MCV 81.9 09/17/2012 1222    Lipid Panel     Component Value Date/Time   CHOL 162 09/15/2012 1020   TRIG 145 09/15/2012 1020   HDL 39* 09/15/2012 1020   CHOLHDL 4.2 09/15/2012 1020   VLDL 29 09/15/2012 1020   LDLCALC 94 09/15/2012 1020    ABG No results found for this basename: phart, pco2, pco2art, po2, po2art, hco3, tco2, acidbasedef, o2sat     No results found for this basename: TSH   BNP (last 3 results) No results found for this basename: PROBNP,  in the last 8760 hours Cardiac Panel (last 3 results) No results found for this basename: CKTOTAL, CKMB, TROPONINI, RELINDX,  in the last 72 hours  Iron/TIBC/Ferritin No results found for this basename: iron, tibc, ferritin     EKG Orders placed in visit on 05/21/13  . EKG 12-LEAD     Prior Assessment and Plan Problem List as of 01/17/2014     Cardiovascular and Mediastinum   Hypertension   Last Assessment & Plan   09/08/2012 Office Visit Written 09/08/2012  1:48 PM by Yehuda Savannah, MD     Blood pressure has typically been elevated in the past, but is relatively good today.  Since he is on a minimal dose of lisinopril, we will increase this to 10 mg per day and request a list of home blood pressure determinations.    Arteriosclerotic cardiovascular disease (ASCVD)   Last Assessment & Plan   09/08/2012 Office Visit Written 09/08/2012  1:44 PM by Yehuda Savannah, MD     No apparent recurrent manifestations of coronary disease, now 11 years following CABG surgery.  We will continue to optimally manage cardiovascular risk factors.     Pulmonary  embolism   Last Assessment & Plan   09/08/2012 Office Visit Edited 09/13/2012 10:24 PM by Yehuda Savannah, MD     Patient continues to do well on warfarin.  INRs have been stable and therapeutic in recent months.  A CBC and stool for Hemoccult testing will be obtained to monitor for possible GI blood loss.      Respiratory   COPD (chronic obstructive pulmonary disease)     Digestive   GERD (gastroesophageal reflux disease)     Genitourinary   Chronic kidney disease, stage III (moderate)   Last Assessment & Plan   09/08/2012 Office Visit Written 09/08/2012  1:46 PM by Yehuda Savannah, MD     No laboratory studies are available for my review since late 2009.  We will seek more recent studies from patient's PCP.      Other   Hyperlipidemia   Last Assessment & Plan   09/08/2012 Office Visit Written 09/08/2012  1:47 PM by Yehuda Savannah, MD     No recent lipid profile available.  We will either obtain results from Dr. Berdine Addison or send patient for a lipid profile.    Chronic anticoagulation   Encounter for therapeutic drug monitoring       Imaging: No results found.

## 2014-01-17 NOTE — Patient Instructions (Addendum)
Your physician recommends that you schedule a follow-up appointment in: 1 month  Your physician recommends that you return for lab work in 61 month before next visit BMET  Your physician recommends that you weigh, daily, at the same time every day, and in the same amount of clothing. Please record your daily weights on the handout provided and bring it to your next appointment.   Your physician has recommended you make the following change in your medication:  Start HCTZ 12.5 mg every other day.  1.5 Gram Low Sodium Diet A 1.5 gram sodium diet restricts the amount of sodium in the diet to no more than 1.5 g or 1500 mg daily. The American Heart Association recommends Americans over the age of 23 to consume no more than 1500 mg of sodium each day to reduce the risk of developing high blood pressure. Research also shows that limiting sodium may reduce heart attack and stroke risk. Many foods contain sodium for flavor and sometimes as a preservative. When the amount of sodium in a diet needs to be low, it is important to know what to look for when choosing foods and drinks. The following includes some information and guidelines to help make it easier for you to adapt to a low sodium diet. QUICK TIPS  Do not add salt to food.  Avoid convenience items and fast food.  Choose unsalted snack foods.  Buy lower sodium products, often labeled as "lower sodium" or "no salt added."  Check food labels to learn how much sodium is in 1 serving.  When eating at a restaurant, ask that your food be prepared with less salt or none, if possible. READING FOOD LABELS FOR SODIUM INFORMATION The nutrition facts label is a good place to find how much sodium is in foods. Look for products with no more than 400 mg of sodium per serving. Remember that 1.5 g = 1500 mg. The food label may also list foods as:  Sodium-free: Less than 5 mg in a serving.  Very low sodium: 35 mg or less in a serving.  Low-sodium: 140 mg  or less in a serving.  Light in sodium: 50% less sodium in a serving. For example, if a food that usually has 300 mg of sodium is changed to become light in sodium, it will have 150 mg of sodium.  Reduced sodium: 25% less sodium in a serving. For example, if a food that usually has 400 mg of sodium is changed to reduced sodium, it will have 300 mg of sodium. CHOOSING FOODS Grains  Avoid: Salted crackers and snack items. Some cereals, including instant hot cereals. Bread stuffing and biscuit mixes. Seasoned rice or pasta mixes.  Choose: Unsalted snack items. Low-sodium cereals, oats, puffed wheat and rice, shredded wheat. English muffins and bread. Pasta. Meats  Avoid: Salted, canned, smoked, spiced, pickled meats, including fish and poultry. Bacon, ham, sausage, cold cuts, hot dogs, anchovies.  Choose: Low-sodium canned tuna and salmon. Fresh or frozen meat, poultry, and fish. Dairy  Avoid: Processed cheese and spreads. Cottage cheese. Buttermilk and condensed milk. Regular cheese.  Choose: Milk. Low-sodium cottage cheese. Yogurt. Sour cream. Low-sodium cheese. Fruits and Vegetables  Avoid: Regular canned vegetables. Regular canned tomato sauce and paste. Frozen vegetables in sauces. Olives. Angie Fava. Relishes. Sauerkraut.  Choose: Low-sodium canned vegetables. Low-sodium tomato sauce and paste. Frozen or fresh vegetables. Fresh and frozen fruit. Condiments  Avoid: Canned and packaged gravies. Worcestershire sauce. Tartar sauce. Barbecue sauce. Soy sauce. Steak sauce. Ketchup. Onion,  garlic, and table salt. Meat flavorings and tenderizers.  Choose: Fresh and dried herbs and spices. Low-sodium varieties of mustard and ketchup. Lemon juice. Tabasco sauce. Horseradish. SAMPLE 1.5 GRAM SODIUM MEAL PLAN Breakfast / Sodium (mg)  1 cup low-fat milk / 143 mg  1 whole-wheat English muffin / 240 mg  1 tbs heart-healthy margarine / 153 mg  1 hard-boiled egg / 139 mg  1 small orange / 0  mg Lunch / Sodium (mg)  1 cup raw carrots / 76 mg  2 tbs no salt added peanut butter / 5 mg  2 slices whole-wheat bread / 270 mg  1 tbs jelly / 6 mg   cup red grapes / 2 mg Dinner / Sodium (mg)  1 cup whole-wheat pasta / 2 mg  1 cup low-sodium tomato sauce / 73 mg  3 oz lean ground beef / 57 mg  1 small side salad (1 cup raw spinach leaves,  cup cucumber,  cup yellow bell pepper) with 1 tsp olive oil and 1 tsp red wine vinegar / 25 mg Snack / Sodium (mg)  1 container low-fat vanilla yogurt / 107 mg  3 graham cracker squares / 127 mg Nutrient Analysis  Calories: 1745  Protein: 75 g  Carbohydrate: 237 g  Fat: 57 g  Sodium: 1425 mg Document Released: 10/21/2005 Document Revised: 01/13/2012 Document Reviewed: 01/22/2010 [employer] Patient Information 2014 [place], [place].

## 2014-01-17 NOTE — Assessment & Plan Note (Signed)
He continues on warfarin, and is followed by Dr. Berdine Addison for PT/INR in dosing.

## 2014-01-17 NOTE — Progress Notes (Signed)
HPI: Mr. Weisgerber is a 80 year old patient of Dr. Pennelope Bracken we are following for ongoing assessment and management of coronary artery disease, status post coronary artery bypass grafting, hypertension, with with cardiovascular risk factors of hyperlipidemia and hypertension. Other history includes PE, chronic kidney disease stage III, and GERD.  I last office visit in July of 2014 the patient was restarted on atenolol and simvastatin by Dr. Pennelope Bracken on that visit. He was not restarted on aspirin. He is continued on Coumadin therapy with history of pulmonary embolism. His blood pressure was elevated at 155/96, and he was restarted on lisinopril 10 mg daily. He was also restarted on simvastatin 20 mg daily. Apparently he had been off all of his medications for unknown period of time on that visit. He is here for followup for ongoing assessment and evaluation of his status.  He has been seen by Dr. Berdine Addison, his primary care physician, but has noticed some fluid retention and lower extremity edema. Patient states that he feels great. However her son states that he is eating some salty foods to include B. Franks in the morning with soup and crackers in the afternoon. His weight is up 16 pounds since July of 2014. But he states he is feeling better and eating better. He is also using boost supplements.    Allergies  Allergen Reactions  . Erythromycin     Current Outpatient Prescriptions  Medication Sig Dispense Refill  . atenolol (TENORMIN) 25 MG tablet Take 1 tablet (25 mg total) by mouth daily.  90 tablet  1  . donepezil (ARICEPT) 5 MG tablet Take 5 mg by mouth daily.      Marland Kitchen lisinopril (PRINIVIL,ZESTRIL) 10 MG tablet Take 1 tablet (10 mg total) by mouth daily.  90 tablet  1  . loratadine (CLARITIN) 10 MG tablet Take 10 mg by mouth daily.      . nitroGLYCERIN (NITROLINGUAL) 0.4 MG/SPRAY spray Place 1 spray under the tongue every 5 (five) minutes as needed for chest pain.  12 g  12  .  polyethylene glycol powder (GLYCOLAX/MIRALAX) powder Take 17 g by mouth 2 (two) times daily.  255 g  0  . simvastatin (ZOCOR) 20 MG tablet Take 1 tablet (20 mg total) by mouth at bedtime.  90 tablet  1  . traMADol (ULTRAM) 50 MG tablet Take 50 mg by mouth every 6 (six) hours as needed. Maximum dose= 8 tablets per day       . warfarin (COUMADIN) 2 MG tablet Take 4 mg by mouth daily.       Marland Kitchen warfarin (COUMADIN) 2 MG tablet Take 3 tablets daily to equal 6 mg daily and as directed.  90 tablet  3  . warfarin (COUMADIN) 2 MG tablet TAKE 2 TABLETS DAILY EXCEPT 3 TABLETS ON MONDAYS, WEDNESDAYS AND FRIDAYS  90 tablet  3  . hydrochlorothiazide (MICROZIDE) 12.5 MG capsule Take 1 capsule (12.5 mg total) by mouth every other day.  30 capsule  1   No current facility-administered medications for this visit.    Past Medical History  Diagnosis Date  . Hypertension   . COPD (chronic obstructive pulmonary disease)   . Pleural effusion   . Chronic kidney disease, stage III (moderate)     Creatinine-1.9 08/2005, 1.5 02/2007, 1.7 12/2007, 1.7 06/2008  . Seminal vesiculitis   . GERD (gastroesophageal reflux disease)   . Hyperlipidemia   . Sinus bradycardia     When on beta blockers  . Arteriosclerotic cardiovascular disease (  ASCVD)     CABG 10/2001 nl EF  . Peripheral vascular disease   . Pulmonary embolism 2007    2007  . Chronic anticoagulation     Managed by [facility]    Past Surgical History  Procedure Laterality Date  . Coronary artery bypass graft  10/2001  . Transurethral resection of prostate  07/2005    BJY:NWGNFA of systems complete and found to be negative unless listed above  PHYSICAL EXAM BP 138/72  Pulse 62  Ht 5\' 9"  (1.753 m)  Wt 168 lb (76.204 kg)  BMI 24.80 kg/m2  SpO2 100%  General: Well developed, well nourished, in no acute distress Head: Eyes PERRLA, No xanthomas.   Normal cephalic and atramatic  Lungs: Mild crackles in the bases without wheezes or rhonchi. Heart: HRRR S1  S2, without MRG.  Pulses are 2+ & equal.            No carotid bruit. No JVD.  No abdominal bruits. No femoral bruits. Abdomen: Bowel sounds are positive, abdomen soft and non-tender without masses or                  Hernia's noted. Msk:  Back normal, normal gait. Normal strength and tone for age. Extremities: No clubbing, cyanosis or nonpitting edema.  DP +1 Neuro: Alert and oriented X 3. Psych:  Good affect, responds appropriately\   ASSESSMENT AND PLAN

## 2014-02-02 ENCOUNTER — Ambulatory Visit (INDEPENDENT_AMBULATORY_CARE_PROVIDER_SITE_OTHER): Payer: Medicare Other | Admitting: *Deleted

## 2014-02-02 DIAGNOSIS — Z5181 Encounter for therapeutic drug level monitoring: Secondary | ICD-10-CM | POA: Diagnosis not present

## 2014-02-02 DIAGNOSIS — Z7901 Long term (current) use of anticoagulants: Secondary | ICD-10-CM

## 2014-02-02 DIAGNOSIS — I2699 Other pulmonary embolism without acute cor pulmonale: Secondary | ICD-10-CM

## 2014-02-02 LAB — POCT INR: INR: 1.5

## 2014-02-11 DIAGNOSIS — I2510 Atherosclerotic heart disease of native coronary artery without angina pectoris: Secondary | ICD-10-CM | POA: Diagnosis not present

## 2014-02-11 DIAGNOSIS — I7090 Unspecified atherosclerosis: Secondary | ICD-10-CM | POA: Diagnosis not present

## 2014-02-12 LAB — BASIC METABOLIC PANEL
BUN: 27 mg/dL — ABNORMAL HIGH (ref 6–23)
CALCIUM: 9.4 mg/dL (ref 8.4–10.5)
CO2: 29 mEq/L (ref 19–32)
CREATININE: 1.68 mg/dL — AB (ref 0.50–1.35)
Chloride: 103 mEq/L (ref 96–112)
Glucose, Bld: 106 mg/dL — ABNORMAL HIGH (ref 70–99)
Potassium: 4.9 mEq/L (ref 3.5–5.3)
SODIUM: 140 meq/L (ref 135–145)

## 2014-02-14 ENCOUNTER — Encounter: Payer: Self-pay | Admitting: *Deleted

## 2014-02-14 ENCOUNTER — Telehealth: Payer: Self-pay | Admitting: *Deleted

## 2014-02-14 DIAGNOSIS — R7989 Other specified abnormal findings of blood chemistry: Secondary | ICD-10-CM

## 2014-02-14 NOTE — Telephone Encounter (Signed)
Spoke to patient concerning lab/test results/instructions from provider. Patient understood.    

## 2014-02-14 NOTE — Telephone Encounter (Signed)
Message copied by Truett Mainland on Mon Feb 14, 2014  9:10 AM ------      Message from: Lendon Colonel      Created: Mon Feb 14, 2014  7:52 AM       Discontinue HCTZ as his creatinine is rising.  Repeat BMET in 2 weeks. ------

## 2014-02-17 ENCOUNTER — Ambulatory Visit (INDEPENDENT_AMBULATORY_CARE_PROVIDER_SITE_OTHER): Payer: Medicare Other | Admitting: Adult Health

## 2014-02-17 ENCOUNTER — Encounter: Payer: Self-pay | Admitting: Adult Health

## 2014-02-17 ENCOUNTER — Ambulatory Visit: Payer: Medicare Other | Admitting: Adult Health

## 2014-02-17 ENCOUNTER — Ambulatory Visit (INDEPENDENT_AMBULATORY_CARE_PROVIDER_SITE_OTHER): Payer: Medicare Other | Admitting: *Deleted

## 2014-02-17 VITALS — BP 142/61 | HR 46 | Ht 69.0 in | Wt 164.0 lb

## 2014-02-17 DIAGNOSIS — Z7901 Long term (current) use of anticoagulants: Secondary | ICD-10-CM

## 2014-02-17 DIAGNOSIS — I10 Essential (primary) hypertension: Secondary | ICD-10-CM | POA: Diagnosis not present

## 2014-02-17 DIAGNOSIS — I2510 Atherosclerotic heart disease of native coronary artery without angina pectoris: Secondary | ICD-10-CM | POA: Diagnosis not present

## 2014-02-17 DIAGNOSIS — I7090 Unspecified atherosclerosis: Secondary | ICD-10-CM | POA: Diagnosis not present

## 2014-02-17 DIAGNOSIS — I2699 Other pulmonary embolism without acute cor pulmonale: Secondary | ICD-10-CM

## 2014-02-17 DIAGNOSIS — Z5181 Encounter for therapeutic drug level monitoring: Secondary | ICD-10-CM | POA: Diagnosis not present

## 2014-02-17 LAB — POCT INR: INR: 1.9

## 2014-02-17 MED ORDER — HYDROCHLOROTHIAZIDE 12.5 MG PO CAPS: ORAL_CAPSULE | ORAL | Status: DC

## 2014-02-17 NOTE — Assessment & Plan Note (Signed)
Excellent control of blood pressure. No changes at this time. Followup labs are reordered.

## 2014-02-17 NOTE — Progress Notes (Signed)
HPI: Mr. Lance Grant is a very pleasant, 80 year old patient of Dr. Pennelope Bracken were following for ongoing assessment and management of CAD, status post CABG, hypertension, with ongoing cardiovascular risk factors. The patient was recently seen in our office in March of 2015, which time he was treated for fluid retention and lower extremity edema. The patient admitted to being salty foods, and his weight was up approximately 16 pounds since July of 2014. Continues on Coumadin for a history of PE.  On that office visit, the patient was placed on a 1500 mg low-sodium diet, started on HCTZ 12.5 mg every other day, has to weigh himself daily. Followup BMET was completed with a sodium of 140 potassium 4.9 chloride 103 CO2 29, crit 1.6 with glucose 106. Due to elevation in creatinine, his HCTZ was discontinued.  He is accompanied by his son who brings his daily weight records, which have been trending downward. The patient has lost 10 pounds since being seen last. He is adhering to a low sodium diet. He offers no complaints of fatigue or shortness of breath. He states he does have a little more energy now.    Allergies  Allergen Reactions  . Erythromycin     Current Outpatient Prescriptions  Medication Sig Dispense Refill  . atenolol (TENORMIN) 25 MG tablet Take 1 tablet (25 mg total) by mouth daily.  90 tablet  1  . donepezil (ARICEPT) 5 MG tablet Take 5 mg by mouth daily.      Marland Kitchen lisinopril (PRINIVIL,ZESTRIL) 10 MG tablet Take 1 tablet (10 mg total) by mouth daily.  90 tablet  1  . loratadine (CLARITIN) 10 MG tablet Take 10 mg by mouth daily.      . nitroGLYCERIN (NITROLINGUAL) 0.4 MG/SPRAY spray Place 1 spray under the tongue every 5 (five) minutes as needed for chest pain.  12 g  12  . polyethylene glycol powder (GLYCOLAX/MIRALAX) powder Take 17 g by mouth 2 (two) times daily.  255 g  0  . simvastatin (ZOCOR) 20 MG tablet Take 1 tablet (20 mg total) by mouth at bedtime.  90 tablet  1  .  traMADol (ULTRAM) 50 MG tablet Take 50 mg by mouth every 6 (six) hours as needed. Maximum dose= 8 tablets per day       . warfarin (COUMADIN) 2 MG tablet Take 4 mg by mouth daily.       Marland Kitchen warfarin (COUMADIN) 2 MG tablet Take 3 tablets daily to equal 6 mg daily and as directed.  90 tablet  3  . warfarin (COUMADIN) 2 MG tablet TAKE 2 TABLETS DAILY EXCEPT 3 TABLETS ON MONDAYS, WEDNESDAYS AND FRIDAYS  90 tablet  3  . hydrochlorothiazide (MICROZIDE) 12.5 MG capsule Take 12.5 mg as needed for fluid retention  30 capsule  3   No current facility-administered medications for this visit.    Past Medical History  Diagnosis Date  . Hypertension   . COPD (chronic obstructive pulmonary disease)   . Pleural effusion   . Chronic kidney disease, stage III (moderate)     Creatinine-1.9 08/2005, 1.5 02/2007, 1.7 12/2007, 1.7 06/2008  . Seminal vesiculitis   . GERD (gastroesophageal reflux disease)   . Hyperlipidemia   . Sinus bradycardia     When on beta blockers  . Arteriosclerotic cardiovascular disease (ASCVD)     CABG 10/2001 nl EF  . Peripheral vascular disease   . Pulmonary embolism 2007    2007  . Chronic anticoagulation  Managed by [facility]    Past Surgical History  Procedure Laterality Date  . Coronary artery bypass graft  10/2001  . Transurethral resection of prostate  07/2005    ROS: Review of systems complete and found to be negative unless listed above  PHYSICAL EXAM BP 142/61  Pulse 46  Ht 5\' 9"  (1.753 m)  Wt 164 lb (74.39 kg)  BMI 24.21 kg/m2 General: Well developed, well nourished, in no acute distress Head: Eyes PERRLA, No xanthomas.   Normal cephalic and atramatic  Lungs: Clear bilaterally to auscultation and percussion. Heart: HRRR S1 S2, without MRG.  Pulses are 2+ & equal.            No carotid bruit. No JVD.  No abdominal bruits. No femoral bruits. Abdomen: Bowel sounds are positive, abdomen soft and non-tender without masses or                  Hernia's  noted. Msk:  Back normal, normal gait. Normal strength and tone for age. Extremities: No clubbing, cyanosis, non-pitting edema.  DP +1 Neuro: Alert and oriented X 3. Psych:  Good affect, responds appropriately     ASSESSMENT AND PLAN

## 2014-02-17 NOTE — Assessment & Plan Note (Signed)
He is seen in our Coumadin clinic today I Ms. Joneen Caraway, Therapist, sports. Coumadin dose is adjusted based upon INR.

## 2014-02-17 NOTE — Progress Notes (Deleted)
Name: Lance Grant    DOB: 03-03-1921  Age: 80 y.o.  MR#: [id number]       PCP:  Maggie Font, MD      Insurance: Payor: MEDICARE / Plan: MEDICARE PART A AND B / Product Type: *No Product type* /   CC:    Chief Complaint  Patient presents with  . Coronary Artery Disease  . Hypertension    VS Filed Vitals:   02/17/14 1450  BP: 142/61  Pulse: 46  Height: 5' 9"  (1.753 m)  Weight: 164 lb (74.39 kg)    Weights Current Weight  02/17/14 164 lb (74.39 kg)  01/17/14 168 lb (76.204 kg)  05/21/13 152 lb 4 oz (69.06 kg)    Blood Pressure  BP Readings from Last 3 Encounters:  02/17/14 142/61  01/17/14 138/72  05/21/13 155/96     Admit date:  (Not on file) Last encounter with RMR:  01/17/2014   Allergy Erythromycin  Current Outpatient Prescriptions  Medication Sig Dispense Refill  . atenolol (TENORMIN) 25 MG tablet Take 1 tablet (25 mg total) by mouth daily.  90 tablet  1  . donepezil (ARICEPT) 5 MG tablet Take 5 mg by mouth daily.      Marland Kitchen lisinopril (PRINIVIL,ZESTRIL) 10 MG tablet Take 1 tablet (10 mg total) by mouth daily.  90 tablet  1  . loratadine (CLARITIN) 10 MG tablet Take 10 mg by mouth daily.      . nitroGLYCERIN (NITROLINGUAL) 0.4 MG/SPRAY spray Place 1 spray under the tongue every 5 (five) minutes as needed for chest pain.  12 g  12  . polyethylene glycol powder (GLYCOLAX/MIRALAX) powder Take 17 g by mouth 2 (two) times daily.  255 g  0  . simvastatin (ZOCOR) 20 MG tablet Take 1 tablet (20 mg total) by mouth at bedtime.  90 tablet  1  . traMADol (ULTRAM) 50 MG tablet Take 50 mg by mouth every 6 (six) hours as needed. Maximum dose= 8 tablets per day       . warfarin (COUMADIN) 2 MG tablet Take 4 mg by mouth daily.       Marland Kitchen warfarin (COUMADIN) 2 MG tablet Take 3 tablets daily to equal 6 mg daily and as directed.  90 tablet  3  . warfarin (COUMADIN) 2 MG tablet TAKE 2 TABLETS DAILY EXCEPT 3 TABLETS ON MONDAYS, WEDNESDAYS AND FRIDAYS  90 tablet  3   No current  facility-administered medications for this visit.    Discontinued Meds:    Medications Discontinued During This Encounter  Medication Reason  . hydrochlorothiazide (MICROZIDE) 12.5 MG capsule Discontinued by provider    Patient Active Problem List   Diagnosis Date Noted  . Encounter for therapeutic drug monitoring 12/01/2013  . Hypertension   . COPD (chronic obstructive pulmonary disease)   . Chronic kidney disease, stage III (moderate)   . GERD (gastroesophageal reflux disease)   . Hyperlipidemia   . Arteriosclerotic cardiovascular disease (ASCVD)   . Pulmonary embolism   . Chronic anticoagulation     LABS    Component Value Date/Time   NA 140 02/11/2014 1548   NA 138 04/10/2013 1145   NA 140 09/15/2012 1020   K 4.9 02/11/2014 1548   K 4.5 04/10/2013 1145   K 5.3 09/15/2012 1020   CL 103 02/11/2014 1548   CL 101 04/10/2013 1145   CL 104 09/15/2012 1020   CO2 29 02/11/2014 1548   CO2 29 04/10/2013 1145   CO2 29 09/15/2012  1020   GLUCOSE 106* 02/11/2014 1548   GLUCOSE 89 04/10/2013 1145   GLUCOSE 96 09/15/2012 1020   BUN 27* 02/11/2014 1548   BUN 17 04/10/2013 1145   BUN 27* 09/15/2012 1020   CREATININE 1.68* 02/11/2014 1548   CREATININE 1.48* 04/10/2013 1145   CREATININE 1.57* 09/15/2012 1020   CREATININE 1.49 02/22/2011 1400   CALCIUM 9.4 02/11/2014 1548   CALCIUM 9.6 04/10/2013 1145   CALCIUM 9.9 09/15/2012 1020   GFRNONAA 40* 04/10/2013 1145   GFRNONAA 45* 02/22/2011 1400   GFRAA 46* 04/10/2013 1145   GFRAA  Value: 54        The eGFR has been calculated using the MDRD equation. This calculation has not been validated in all clinical situations. eGFR's persistently <60 mL/min signify possible Chronic Kidney Disease.* 02/22/2011 1400   CMP     Component Value Date/Time   NA 140 02/11/2014 1548   K 4.9 02/11/2014 1548   CL 103 02/11/2014 1548   CO2 29 02/11/2014 1548   GLUCOSE 106* 02/11/2014 1548   BUN 27* 02/11/2014 1548   CREATININE 1.68* 02/11/2014 1548   CREATININE 1.48* 04/10/2013  1145   CALCIUM 9.4 02/11/2014 1548   PROT 6.2 09/15/2012 1020   ALBUMIN 4.0 09/15/2012 1020   AST 21 09/15/2012 1020   ALT 15 09/15/2012 1020   ALKPHOS 52 09/15/2012 1020   BILITOT 0.6 09/15/2012 1020   GFRNONAA 40* 04/10/2013 1145   GFRAA 46* 04/10/2013 1145       Component Value Date/Time   WBC 4.7 04/10/2013 1145   WBC 6.6 09/17/2012 1222   HGB 12.8* 04/10/2013 1145   HGB 12.7* 09/17/2012 1222   HGB 12.1* 02/22/2011 1400   HCT 36.0* 04/10/2013 1145   HCT 37.1* 09/17/2012 1222   HCT 35.8* 02/22/2011 1400   MCV 84.5 04/10/2013 1145   MCV 81.9 09/17/2012 1222    Lipid Panel     Component Value Date/Time   CHOL 162 09/15/2012 1020   TRIG 145 09/15/2012 1020   HDL 39* 09/15/2012 1020   CHOLHDL 4.2 09/15/2012 1020   VLDL 29 09/15/2012 1020   LDLCALC 94 09/15/2012 1020    ABG No results found for this basename: phart, pco2, pco2art, po2, po2art, hco3, tco2, acidbasedef, o2sat     No results found for this basename: TSH   BNP (last 3 results) No results found for this basename: PROBNP,  in the last 8760 hours Cardiac Panel (last 3 results) No results found for this basename: CKTOTAL, CKMB, TROPONINI, RELINDX,  in the last 72 hours  Iron/TIBC/Ferritin No results found for this basename: iron, tibc, ferritin     EKG Orders placed in visit on 05/21/13  . EKG 12-LEAD     Prior Assessment and Plan Problem List as of 02/17/2014     Cardiovascular and Mediastinum   Hypertension   Last Assessment & Plan   01/17/2014 Office Visit Written 01/17/2014  4:45 PM by Lendon Colonel, NP     Blood pressure is well-controlled. He is not avoiding salt. He is tolerating this medication so without difficulty. I started her on HCTZ 12.5 mg every other day. He is to weigh daily, has been placed on a 1500 mg low-sodium diet. He will have a BMET in one month.    Arteriosclerotic cardiovascular disease (ASCVD)   Last Assessment & Plan   01/17/2014 Office Visit Written 01/17/2014  4:46 PM by Lendon Colonel, NP     He denies recurrent chest  pain, worsening shortness of breath, or weakness. No planned cardiac testing at this time.    Pulmonary embolism   Last Assessment & Plan   01/17/2014 Office Visit Written 01/17/2014  4:45 PM by Lendon Colonel, NP     He continues on warfarin, and is followed by Dr. Berdine Addison for PT/INR in dosing.      Respiratory   COPD (chronic obstructive pulmonary disease)     Digestive   GERD (gastroesophageal reflux disease)     Genitourinary   Chronic kidney disease, stage III (moderate)   Last Assessment & Plan   09/08/2012 Office Visit Written 09/08/2012  1:46 PM by Yehuda Savannah, MD     No laboratory studies are available for my review since late 2009.  We will seek more recent studies from patient's PCP.      Other   Hyperlipidemia   Last Assessment & Plan   09/08/2012 Office Visit Written 09/08/2012  1:47 PM by Yehuda Savannah, MD     No recent lipid profile available.  We will either obtain results from Dr. Berdine Addison or send patient for a lipid profile.    Chronic anticoagulation   Encounter for therapeutic drug monitoring       Imaging: No results found.

## 2014-02-17 NOTE — Assessment & Plan Note (Signed)
Lower extremity edema is essentially eliminated, his weight is down 10 pounds, he is breathing and feeling better. HCTZ has been discontinued currently. I have ordered a refill on this to use when necessary fluid retention. He is adhering to a low sodium diet as his children are his main caregivers are very stringent concerning this. He denies chest pain or dyspnea on exertion, and feels as if he has more energy. He is due for a BMET on April 27, we will review.

## 2014-02-17 NOTE — Patient Instructions (Signed)
Your physician recommends that you schedule a follow-up appointment in: 4 months with Dr Virgina Jock will receive a reminder letter two months in advance reminding you to call and schedule your appointment. If you don't receive this letter, please contact our office.  Your physician has recommended you make the following change in your medication:   Please take HCTZ as needed for fluid retention

## 2014-02-18 DIAGNOSIS — M25569 Pain in unspecified knee: Secondary | ICD-10-CM | POA: Diagnosis not present

## 2014-02-18 DIAGNOSIS — I10 Essential (primary) hypertension: Secondary | ICD-10-CM | POA: Diagnosis not present

## 2014-02-28 DIAGNOSIS — R799 Abnormal finding of blood chemistry, unspecified: Secondary | ICD-10-CM | POA: Diagnosis not present

## 2014-02-28 LAB — BASIC METABOLIC PANEL
BUN: 20 mg/dL (ref 6–23)
CO2: 29 mEq/L (ref 19–32)
Calcium: 9.5 mg/dL (ref 8.4–10.5)
Chloride: 104 mEq/L (ref 96–112)
Creat: 1.53 mg/dL — ABNORMAL HIGH (ref 0.50–1.35)
Glucose, Bld: 87 mg/dL (ref 70–99)
POTASSIUM: 5 meq/L (ref 3.5–5.3)
SODIUM: 140 meq/L (ref 135–145)

## 2014-03-01 ENCOUNTER — Encounter: Payer: Self-pay | Admitting: *Deleted

## 2014-03-09 ENCOUNTER — Ambulatory Visit (INDEPENDENT_AMBULATORY_CARE_PROVIDER_SITE_OTHER): Payer: Medicare Other | Admitting: *Deleted

## 2014-03-09 DIAGNOSIS — Z5181 Encounter for therapeutic drug level monitoring: Secondary | ICD-10-CM

## 2014-03-09 DIAGNOSIS — I2699 Other pulmonary embolism without acute cor pulmonale: Secondary | ICD-10-CM

## 2014-03-09 DIAGNOSIS — Z7901 Long term (current) use of anticoagulants: Secondary | ICD-10-CM

## 2014-03-09 LAB — POCT INR: INR: 2.5

## 2014-03-31 ENCOUNTER — Telehealth: Payer: Self-pay | Admitting: *Deleted

## 2014-03-31 NOTE — Telephone Encounter (Signed)
PT HAS GAINED ABOUT 12 LBS IN THE LAST MONTH, HE WAS TOOK OFF FLUID PILLS AT LAST VISIT. HE DID TAKE ONE THIS AFTERNOON BUT NEEDS TO KNOW HOW TO PROCEED.

## 2014-03-31 NOTE — Telephone Encounter (Signed)
Pts son made aware.

## 2014-03-31 NOTE — Telephone Encounter (Signed)
Pt has only gained 6 lbs since LOV. On 4/16 pt was 164lb, today he is 170lbs.   Pt's sons states his ankles are only swelling a little, not too much. No redness or pain. Pt's son has only given the pt the as needed HCTZ today. This nurse asked why he did not give it to him sooner, he stated he did not know if he should give it everyday or every other day. Please advise

## 2014-03-31 NOTE — Telephone Encounter (Signed)
Give HCTZ every other day until back at baseline wt. If cramping occurs eat a banana with each dose of HCTZ.

## 2014-04-01 DIAGNOSIS — M25569 Pain in unspecified knee: Secondary | ICD-10-CM | POA: Diagnosis not present

## 2014-04-01 DIAGNOSIS — M25579 Pain in unspecified ankle and joints of unspecified foot: Secondary | ICD-10-CM | POA: Diagnosis not present

## 2014-04-01 DIAGNOSIS — I10 Essential (primary) hypertension: Secondary | ICD-10-CM | POA: Diagnosis not present

## 2014-04-06 ENCOUNTER — Ambulatory Visit (INDEPENDENT_AMBULATORY_CARE_PROVIDER_SITE_OTHER): Payer: Medicare Other | Admitting: *Deleted

## 2014-04-06 DIAGNOSIS — I2699 Other pulmonary embolism without acute cor pulmonale: Secondary | ICD-10-CM | POA: Diagnosis not present

## 2014-04-06 DIAGNOSIS — Z5181 Encounter for therapeutic drug level monitoring: Secondary | ICD-10-CM | POA: Diagnosis not present

## 2014-04-06 DIAGNOSIS — Z7901 Long term (current) use of anticoagulants: Secondary | ICD-10-CM

## 2014-04-06 LAB — POCT INR: INR: 2.1

## 2014-04-13 ENCOUNTER — Telehealth: Payer: Self-pay | Admitting: Cardiovascular Disease

## 2014-04-13 MED ORDER — WARFARIN SODIUM 2 MG PO TABS: ORAL_TABLET | ORAL | Status: DC

## 2014-04-13 NOTE — Telephone Encounter (Signed)
Refill sent.

## 2014-04-16 DIAGNOSIS — I2510 Atherosclerotic heart disease of native coronary artery without angina pectoris: Secondary | ICD-10-CM | POA: Diagnosis not present

## 2014-04-16 DIAGNOSIS — H6120 Impacted cerumen, unspecified ear: Secondary | ICD-10-CM | POA: Diagnosis not present

## 2014-04-16 DIAGNOSIS — I10 Essential (primary) hypertension: Secondary | ICD-10-CM | POA: Diagnosis not present

## 2014-04-18 DIAGNOSIS — I2510 Atherosclerotic heart disease of native coronary artery without angina pectoris: Secondary | ICD-10-CM | POA: Diagnosis not present

## 2014-04-18 DIAGNOSIS — I10 Essential (primary) hypertension: Secondary | ICD-10-CM | POA: Diagnosis not present

## 2014-04-18 DIAGNOSIS — Z7901 Long term (current) use of anticoagulants: Secondary | ICD-10-CM | POA: Diagnosis not present

## 2014-05-05 ENCOUNTER — Ambulatory Visit (INDEPENDENT_AMBULATORY_CARE_PROVIDER_SITE_OTHER): Payer: Medicare Other | Admitting: *Deleted

## 2014-05-05 DIAGNOSIS — I2699 Other pulmonary embolism without acute cor pulmonale: Secondary | ICD-10-CM | POA: Diagnosis not present

## 2014-05-05 DIAGNOSIS — Z7901 Long term (current) use of anticoagulants: Secondary | ICD-10-CM

## 2014-05-05 DIAGNOSIS — Z5181 Encounter for therapeutic drug level monitoring: Secondary | ICD-10-CM | POA: Diagnosis not present

## 2014-05-05 LAB — POCT INR: INR: 1.7

## 2014-05-13 DIAGNOSIS — M25579 Pain in unspecified ankle and joints of unspecified foot: Secondary | ICD-10-CM | POA: Diagnosis not present

## 2014-05-13 DIAGNOSIS — M25569 Pain in unspecified knee: Secondary | ICD-10-CM | POA: Diagnosis not present

## 2014-05-13 DIAGNOSIS — I10 Essential (primary) hypertension: Secondary | ICD-10-CM | POA: Diagnosis not present

## 2014-05-19 ENCOUNTER — Ambulatory Visit (INDEPENDENT_AMBULATORY_CARE_PROVIDER_SITE_OTHER): Payer: Medicare Other | Admitting: *Deleted

## 2014-05-19 DIAGNOSIS — Z7901 Long term (current) use of anticoagulants: Secondary | ICD-10-CM | POA: Diagnosis not present

## 2014-05-19 DIAGNOSIS — I2699 Other pulmonary embolism without acute cor pulmonale: Secondary | ICD-10-CM | POA: Diagnosis not present

## 2014-05-19 DIAGNOSIS — Z5181 Encounter for therapeutic drug level monitoring: Secondary | ICD-10-CM

## 2014-05-19 LAB — POCT INR: INR: 2.2

## 2014-05-31 DIAGNOSIS — Z961 Presence of intraocular lens: Secondary | ICD-10-CM | POA: Diagnosis not present

## 2014-06-13 ENCOUNTER — Other Ambulatory Visit: Payer: Self-pay | Admitting: *Deleted

## 2014-06-13 ENCOUNTER — Telehealth: Payer: Self-pay | Admitting: Adult Health

## 2014-06-13 MED ORDER — HYDROCHLOROTHIAZIDE 12.5 MG PO CAPS: ORAL_CAPSULE | ORAL | Status: DC

## 2014-06-13 NOTE — Telephone Encounter (Signed)
Please see refill bin / tgs  °

## 2014-06-15 ENCOUNTER — Ambulatory Visit (INDEPENDENT_AMBULATORY_CARE_PROVIDER_SITE_OTHER): Payer: Medicare Other | Admitting: Cardiovascular Disease

## 2014-06-15 ENCOUNTER — Ambulatory Visit (INDEPENDENT_AMBULATORY_CARE_PROVIDER_SITE_OTHER): Payer: Medicare Other | Admitting: *Deleted

## 2014-06-15 ENCOUNTER — Encounter: Payer: Self-pay | Admitting: Cardiovascular Disease

## 2014-06-15 VITALS — BP 138/80 | HR 62 | Ht 69.0 in | Wt 175.0 lb

## 2014-06-15 DIAGNOSIS — I2699 Other pulmonary embolism without acute cor pulmonale: Secondary | ICD-10-CM

## 2014-06-15 DIAGNOSIS — I10 Essential (primary) hypertension: Secondary | ICD-10-CM

## 2014-06-15 DIAGNOSIS — I7090 Unspecified atherosclerosis: Secondary | ICD-10-CM | POA: Diagnosis not present

## 2014-06-15 DIAGNOSIS — Z7901 Long term (current) use of anticoagulants: Secondary | ICD-10-CM

## 2014-06-15 DIAGNOSIS — I25812 Atherosclerosis of bypass graft of coronary artery of transplanted heart without angina pectoris: Secondary | ICD-10-CM

## 2014-06-15 DIAGNOSIS — Z5181 Encounter for therapeutic drug level monitoring: Secondary | ICD-10-CM

## 2014-06-15 DIAGNOSIS — E785 Hyperlipidemia, unspecified: Secondary | ICD-10-CM

## 2014-06-15 DIAGNOSIS — I2510 Atherosclerotic heart disease of native coronary artery without angina pectoris: Secondary | ICD-10-CM | POA: Diagnosis not present

## 2014-06-15 LAB — POCT INR: INR: 1.8

## 2014-06-15 NOTE — Patient Instructions (Signed)
Your physician wants you to follow-up in: 6 months You will receive a reminder letter in the mail two months in advance. If you don't receive a letter, please call our office to schedule the follow-up appointment.     Your physician recommends that you continue on your current medications as directed. Please refer to the Current Medication list given to you today.      Thank you for choosing [facility] !        

## 2014-06-15 NOTE — Progress Notes (Signed)
Patient ID: Lance Grant, male   DOB: 1921-07-07, 80 y.o.   MRN: [id number]      SUBJECTIVE: Lance Grant is a pleasant 80 yr old male with a PMH significant for pulmonary embolism, CAD s/p CABG, HTN, hyperlipidemia, CKD stage III, and GERD.   He denies chest pain and shortness of breath, as well as palpitations and leg swelling. He said, "I feel very well and don't have any complaints".   ECG performed in the office today demonstrates sinus bradycardia, heart rate 56 beats per minute with LVH and repolarization abnormalities.   Soc: He grew up in [place], and came to [employer] after he retired from the service. He was initially in the [employer], and then was in [employer]. He attended [employer]. 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    Review of Systems: As per "subjective", otherwise negative.  Allergies  Allergen Reactions  . Erythromycin     Current Outpatient Prescriptions  Medication Sig Dispense Refill  . atenolol (TENORMIN) 25 MG tablet Take 1 tablet (25 mg total) by mouth daily.  90 tablet  1  . donepezil (ARICEPT) 5 MG tablet Take 5 mg by mouth daily.      . hydrochlorothiazide (MICROZIDE) 12.5 MG capsule Take 12.5 mg as needed for fluid retention  90 capsule  3  . lisinopril (PRINIVIL,ZESTRIL) 10 MG tablet Take 1 tablet (10 mg total) by mouth daily.  90 tablet  1  . loratadine (CLARITIN) 10 MG tablet Take 10 mg by mouth daily.      . nitroGLYCERIN (NITROLINGUAL) 0.4 MG/SPRAY spray Place 1 spray under the tongue every 5 (five) minutes as needed for chest pain.  12 g  12  . polyethylene glycol powder (GLYCOLAX/MIRALAX) powder Take 17 g by mouth 2 (two) times daily.  255 g  0  . simvastatin (ZOCOR) 20 MG tablet Take 1 tablet (20 mg total) by mouth at bedtime.  90 tablet  1  . traMADol (ULTRAM) 50 MG tablet Take 50 mg by mouth every 6 (six) hours as needed. Maximum dose= 8 tablets per day        . warfarin (COUMADIN) 2 MG tablet Take 3 tablets daily except 2 tablets on Mondays, Wednesdays and Fridays  90 tablet  3   No current facility-administered medications for this visit.    Past Medical History  Diagnosis Date  . Hypertension   . COPD (chronic obstructive pulmonary disease)   . Pleural effusion   . Chronic kidney disease, stage III (moderate)     Creatinine-1.9 08/2005, 1.5 02/2007, 1.7 12/2007, 1.7 06/2008  . Seminal vesiculitis   . GERD (gastroesophageal reflux disease)   . Hyperlipidemia   . Sinus bradycardia     When on beta blockers  . Arteriosclerotic cardiovascular disease (ASCVD)     CABG 10/2001 nl EF  . Peripheral vascular disease   . Pulmonary embolism 2007    2007  . Chronic anticoagulation     Managed by [facility]    Past Surgical History  Procedure Laterality Date  . Coronary artery bypass graft  10/2001  . Transurethral resection of prostate  07/2005    History   Social History  . Marital Status: Married    Spouse Name: N/A    Number of Children: 2  . Years of Education: N/A   Occupational History  . 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 [employer] for a total of 17 years; recently declined renominationb   Social History Main Topics  . Smoking status: Former Smoker -- 1.00 packs/day for 10 years    Quit date: 11/05/1987  . Smokeless tobacco: Not on file  . Alcohol Use: 4.2 oz/week    7 Cans of beer per week     Comment: 1 can beer/day  . Drug Use: No  . Sexual Activity: Not on file   Other Topics Concern  . Not on file   Social History Narrative   Married   2 children, 2 grandchildren   No regular exercise   BP 138/80 Pulse 62     PHYSICAL EXAM General: NAD Neck: No JVD, no thyromegaly. Lungs: Clear to auscultation bilaterally with normal respiratory effort. CV: Nondisplaced PMI.  Regular rate and rhythm, normal S1/S2, no S3/S4, no murmur. No pretibial or periankle edema.  No  carotid bruit.  Normal pedal pulses.  Abdomen: Soft, nontender, no hepatosplenomegaly, no distention.  Neurologic: Alert and oriented x 3.  Psych: Normal affect. Extremities: No clubbing or cyanosis.   ECG: reviewed and available in electronic records.      ASSESSMENT AND PLAN: 1. CAD s/p CABG: Symptomatically stable. Continue atenolol and simvastatin. 2. Pulmonary Embolism: On Warfarin.  3. HTN: Controlled on present therapy which includes lisinopril. 4. Hyperlipidemia: On simvastatin. Will obtain copy of lipids and LFT's from PCP's office.  Dispo: f/u 6 months.  [name], M.D., F.A.C.C.

## 2014-07-01 DIAGNOSIS — M25569 Pain in unspecified knee: Secondary | ICD-10-CM | POA: Diagnosis not present

## 2014-07-01 DIAGNOSIS — M25579 Pain in unspecified ankle and joints of unspecified foot: Secondary | ICD-10-CM | POA: Diagnosis not present

## 2014-07-01 DIAGNOSIS — I10 Essential (primary) hypertension: Secondary | ICD-10-CM | POA: Diagnosis not present

## 2014-07-04 ENCOUNTER — Ambulatory Visit (INDEPENDENT_AMBULATORY_CARE_PROVIDER_SITE_OTHER): Payer: Medicare Other | Admitting: *Deleted

## 2014-07-04 DIAGNOSIS — Z7901 Long term (current) use of anticoagulants: Secondary | ICD-10-CM | POA: Diagnosis not present

## 2014-07-04 DIAGNOSIS — I2699 Other pulmonary embolism without acute cor pulmonale: Secondary | ICD-10-CM | POA: Diagnosis not present

## 2014-07-04 DIAGNOSIS — Z5181 Encounter for therapeutic drug level monitoring: Secondary | ICD-10-CM

## 2014-07-04 LAB — POCT INR: INR: 3.1

## 2014-07-19 ENCOUNTER — Encounter: Payer: Self-pay | Admitting: Cardiovascular Disease

## 2014-07-20 ENCOUNTER — Ambulatory Visit (INDEPENDENT_AMBULATORY_CARE_PROVIDER_SITE_OTHER): Payer: Medicare Other | Admitting: *Deleted

## 2014-07-20 DIAGNOSIS — Z5181 Encounter for therapeutic drug level monitoring: Secondary | ICD-10-CM | POA: Diagnosis not present

## 2014-07-20 DIAGNOSIS — Z7901 Long term (current) use of anticoagulants: Secondary | ICD-10-CM

## 2014-07-20 DIAGNOSIS — I2699 Other pulmonary embolism without acute cor pulmonale: Secondary | ICD-10-CM | POA: Diagnosis not present

## 2014-07-20 LAB — POCT INR: INR: 2.8

## 2014-07-23 DIAGNOSIS — I10 Essential (primary) hypertension: Secondary | ICD-10-CM | POA: Diagnosis not present

## 2014-07-23 DIAGNOSIS — I2510 Atherosclerotic heart disease of native coronary artery without angina pectoris: Secondary | ICD-10-CM | POA: Diagnosis not present

## 2014-08-01 ENCOUNTER — Telehealth: Payer: Self-pay | Admitting: *Deleted

## 2014-08-01 MED ORDER — WARFARIN SODIUM 2 MG PO TABS: ORAL_TABLET | ORAL | Status: DC

## 2014-08-01 NOTE — Telephone Encounter (Signed)
RX REFILL from [facility] 2 mg tab #90

## 2014-08-12 DIAGNOSIS — I10 Essential (primary) hypertension: Secondary | ICD-10-CM | POA: Diagnosis not present

## 2014-08-12 DIAGNOSIS — M25561 Pain in right knee: Secondary | ICD-10-CM | POA: Diagnosis not present

## 2014-08-12 DIAGNOSIS — M25571 Pain in right ankle and joints of right foot: Secondary | ICD-10-CM | POA: Diagnosis not present

## 2014-08-17 ENCOUNTER — Ambulatory Visit (INDEPENDENT_AMBULATORY_CARE_PROVIDER_SITE_OTHER): Payer: Medicare Other | Admitting: *Deleted

## 2014-08-17 DIAGNOSIS — Z7901 Long term (current) use of anticoagulants: Secondary | ICD-10-CM

## 2014-08-17 DIAGNOSIS — Z5181 Encounter for therapeutic drug level monitoring: Secondary | ICD-10-CM

## 2014-08-17 DIAGNOSIS — I2699 Other pulmonary embolism without acute cor pulmonale: Secondary | ICD-10-CM

## 2014-08-17 LAB — POCT INR: INR: 3.5

## 2014-09-07 ENCOUNTER — Ambulatory Visit (INDEPENDENT_AMBULATORY_CARE_PROVIDER_SITE_OTHER): Payer: Medicare Other | Admitting: *Deleted

## 2014-09-07 DIAGNOSIS — Z5181 Encounter for therapeutic drug level monitoring: Secondary | ICD-10-CM

## 2014-09-07 DIAGNOSIS — I2699 Other pulmonary embolism without acute cor pulmonale: Secondary | ICD-10-CM | POA: Diagnosis not present

## 2014-09-07 DIAGNOSIS — I4891 Unspecified atrial fibrillation: Secondary | ICD-10-CM | POA: Diagnosis not present

## 2014-09-07 LAB — POCT INR: INR: 3.8

## 2014-09-23 DIAGNOSIS — I10 Essential (primary) hypertension: Secondary | ICD-10-CM | POA: Diagnosis not present

## 2014-09-23 DIAGNOSIS — M25561 Pain in right knee: Secondary | ICD-10-CM | POA: Diagnosis not present

## 2014-09-23 DIAGNOSIS — M25571 Pain in right ankle and joints of right foot: Secondary | ICD-10-CM | POA: Diagnosis not present

## 2014-09-26 ENCOUNTER — Ambulatory Visit (INDEPENDENT_AMBULATORY_CARE_PROVIDER_SITE_OTHER): Payer: Medicare Other | Admitting: *Deleted

## 2014-09-26 DIAGNOSIS — Z5181 Encounter for therapeutic drug level monitoring: Secondary | ICD-10-CM

## 2014-09-26 DIAGNOSIS — I2699 Other pulmonary embolism without acute cor pulmonale: Secondary | ICD-10-CM | POA: Diagnosis not present

## 2014-09-26 LAB — POCT INR: INR: 3.9

## 2014-10-17 ENCOUNTER — Other Ambulatory Visit: Payer: Self-pay | Admitting: Adult Health

## 2014-10-17 ENCOUNTER — Ambulatory Visit (INDEPENDENT_AMBULATORY_CARE_PROVIDER_SITE_OTHER): Payer: Medicare Other | Admitting: *Deleted

## 2014-10-17 DIAGNOSIS — Z5181 Encounter for therapeutic drug level monitoring: Secondary | ICD-10-CM | POA: Diagnosis not present

## 2014-10-17 DIAGNOSIS — I2699 Other pulmonary embolism without acute cor pulmonale: Secondary | ICD-10-CM | POA: Diagnosis not present

## 2014-10-17 LAB — POCT INR: INR: 2.6

## 2014-11-09 DIAGNOSIS — I10 Essential (primary) hypertension: Secondary | ICD-10-CM | POA: Diagnosis not present

## 2014-11-09 DIAGNOSIS — M25561 Pain in right knee: Secondary | ICD-10-CM | POA: Diagnosis not present

## 2014-11-14 ENCOUNTER — Ambulatory Visit (INDEPENDENT_AMBULATORY_CARE_PROVIDER_SITE_OTHER): Payer: Medicare Other | Admitting: *Deleted

## 2014-11-14 DIAGNOSIS — I2699 Other pulmonary embolism without acute cor pulmonale: Secondary | ICD-10-CM

## 2014-11-14 DIAGNOSIS — Z5181 Encounter for therapeutic drug level monitoring: Secondary | ICD-10-CM | POA: Diagnosis not present

## 2014-11-14 LAB — POCT INR: INR: 2.8

## 2014-11-19 DIAGNOSIS — I2510 Atherosclerotic heart disease of native coronary artery without angina pectoris: Secondary | ICD-10-CM | POA: Diagnosis not present

## 2014-11-19 DIAGNOSIS — I10 Essential (primary) hypertension: Secondary | ICD-10-CM | POA: Diagnosis not present

## 2014-11-21 ENCOUNTER — Telehealth: Payer: Self-pay | Admitting: *Deleted

## 2014-11-21 MED ORDER — WARFARIN SODIUM 2 MG PO TABS: ORAL_TABLET | ORAL | Status: DC

## 2014-11-21 NOTE — Telephone Encounter (Signed)
Refill request for warfarin 2 mg from [facility] [place]. Medication sent to pharmacy.

## 2014-12-14 ENCOUNTER — Ambulatory Visit (INDEPENDENT_AMBULATORY_CARE_PROVIDER_SITE_OTHER): Payer: Medicare Other | Admitting: *Deleted

## 2014-12-14 ENCOUNTER — Ambulatory Visit: Payer: Medicare Other | Admitting: Cardiovascular Disease

## 2014-12-14 DIAGNOSIS — I2699 Other pulmonary embolism without acute cor pulmonale: Secondary | ICD-10-CM | POA: Diagnosis not present

## 2014-12-14 DIAGNOSIS — Z5181 Encounter for therapeutic drug level monitoring: Secondary | ICD-10-CM | POA: Diagnosis not present

## 2014-12-14 LAB — POCT INR: INR: 2.6

## 2014-12-21 DIAGNOSIS — M25561 Pain in right knee: Secondary | ICD-10-CM | POA: Diagnosis not present

## 2014-12-21 DIAGNOSIS — I10 Essential (primary) hypertension: Secondary | ICD-10-CM | POA: Diagnosis not present

## 2014-12-26 ENCOUNTER — Encounter: Payer: Self-pay | Admitting: Cardiovascular Disease

## 2014-12-26 ENCOUNTER — Ambulatory Visit (INDEPENDENT_AMBULATORY_CARE_PROVIDER_SITE_OTHER): Payer: Medicare Other | Admitting: Cardiovascular Disease

## 2014-12-26 VITALS — BP 138/72 | HR 54 | Ht 69.0 in | Wt 180.0 lb

## 2014-12-26 DIAGNOSIS — I10 Essential (primary) hypertension: Secondary | ICD-10-CM | POA: Diagnosis not present

## 2014-12-26 DIAGNOSIS — I2699 Other pulmonary embolism without acute cor pulmonale: Secondary | ICD-10-CM | POA: Diagnosis not present

## 2014-12-26 DIAGNOSIS — E785 Hyperlipidemia, unspecified: Secondary | ICD-10-CM

## 2014-12-26 DIAGNOSIS — I25812 Atherosclerosis of bypass graft of coronary artery of transplanted heart without angina pectoris: Secondary | ICD-10-CM | POA: Diagnosis not present

## 2014-12-26 DIAGNOSIS — R600 Localized edema: Secondary | ICD-10-CM | POA: Diagnosis not present

## 2014-12-26 MED ORDER — FUROSEMIDE 20 MG PO TABS: 20.0000 mg | ORAL_TABLET | Freq: Every day | ORAL | Status: DC

## 2014-12-26 NOTE — Progress Notes (Signed)
Patient ID: Lance Grant, male   DOB: 01-10-1921, 81 y.o.   MRN: [id number]      SUBJECTIVE: Lance Grant is a pleasant 81 yr old male with a PMH significant for pulmonary embolism, CAD s/p CABG, essential hypertension, hyperlipidemia, CKD stage III, and GERD.  He denies chest pain and shortness of breath, as well as palpitations and dizziness.   He and his wife have a nurse who stays with them 4 hours daily, and she noticed he has been having leg swelling for which he takes HCTZ 12.5 mg three days per week. He said, "I believe I'm doing well for a 81 year old man".   Soc: He grew up in [place], and came to [facility] after he retired from the service. He was initially in the [employer], and then was in [employer]. He attended [employer]. 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   Review of Systems: As per "subjective", otherwise negative.  Allergies  Allergen Reactions  . Erythromycin     Current Outpatient Prescriptions  Medication Sig Dispense Refill  . atenolol (TENORMIN) 25 MG tablet Take 1 tablet (25 mg total) by mouth daily. 90 tablet 1  . donepezil (ARICEPT) 5 MG tablet Take 5 mg by mouth daily.    . hydrochlorothiazide (MICROZIDE) 12.5 MG capsule Take 12.5 mg as needed for fluid retention 90 capsule 3  . hydrochlorothiazide (MICROZIDE) 12.5 MG capsule TAKE 12.5 MG AS NEEDED FOR FLUID RETENTION 30 capsule 3  . lisinopril (PRINIVIL,ZESTRIL) 10 MG tablet Take 1 tablet (10 mg total) by mouth daily. 90 tablet 1  . loratadine (CLARITIN) 10 MG tablet Take 10 mg by mouth daily.    . nitroGLYCERIN (NITROLINGUAL) 0.4 MG/SPRAY spray Place 1 spray under the tongue every 5 (five) minutes as needed for chest pain. 12 g 12  . polyethylene glycol powder (GLYCOLAX/MIRALAX) powder Take 17 g by mouth 2 (two) times daily. 255 g 0  . simvastatin (ZOCOR) 20 MG tablet Take 1 tablet (20 mg total) by mouth at bedtime. 90  tablet 1  . traMADol (ULTRAM) 50 MG tablet Take 50 mg by mouth every 6 (six) hours as needed. Maximum dose= 8 tablets per day     . warfarin (COUMADIN) 2 MG tablet Take 3 tablets daily except 2 tablets on Mondays 90 tablet 3   No current facility-administered medications for this visit.    Past Medical History  Diagnosis Date  . Hypertension   . COPD (chronic obstructive pulmonary disease)   . Pleural effusion   . Chronic kidney disease, stage III (moderate)     Creatinine-1.9 08/2005, 1.5 02/2007, 1.7 12/2007, 1.7 06/2008  . Seminal vesiculitis   . GERD (gastroesophageal reflux disease)   . Hyperlipidemia   . Sinus bradycardia     When on beta blockers  . Arteriosclerotic cardiovascular disease (ASCVD)     CABG 10/2001 nl EF  . Peripheral vascular disease   . Pulmonary embolism 2007    2007  . Chronic anticoagulation     Managed by [facility]    Past Surgical History  Procedure Laterality Date  . Coronary artery bypass graft  10/2001  . Transurethral resection of prostate  07/2005    History   Social History  . Marital Status: Married    Spouse Name: N/A  . Number of Children: 2  . Years of Education: N/A   Occupational History  .  Retired     [employer]/ [employer]  . [employer] for a total of 17 years; recently declined renominationb   Social History Main Topics  . Smoking status: Former Smoker -- 1.00 packs/day for 10 years    Start date: 11/27/1946    Quit date: 11/05/1987  . Smokeless tobacco: Never Used  . Alcohol Use: 4.2 oz/week    7 Cans of beer per week     Comment: 1 can beer/day  . Drug Use: No  . Sexual Activity: Not on file   Other Topics Concern  . Not on file   Social History Narrative   Married   2 children, 2 grandchildren   No regular exercise     Filed Vitals:   12/26/14 1351  BP: 138/72  Pulse: 54  Height: 5\' 9"  (1.753 m)  Weight: 180 lb (81.647 kg)  SpO2: 98%    PHYSICAL EXAM General:  NAD, very pleasant. HEENT: Normal. Neck: No JVD, no thyromegaly. Lungs: Clear to auscultation bilaterally with normal respiratory effort. CV: Nondisplaced PMI.  Regular rate and rhythm, normal S1/S2, no B6/L8, soft 1/6 systolic murmur along left sternal border and RUSB. 1+ pretibial and periankle edema.  No carotid bruit.   Abdomen: Soft, nontender, no distention.  Neurologic: Alert and oriented.  Psych: Normal affect. Skin: Normal. Musculoskeletal: No gross deformities. Extremities: No clubbing or cyanosis.   ECG: Most recent ECG reviewed.      ASSESSMENT AND PLAN: 1. CAD s/p CABG: Symptomatically stable. Continue atenolol and simvastatin. 2. Pulmonary Embolism: On Warfarin.  3. Essential HTN: Controlled on present therapy which includes lisinopril. No changes. 4. Hyperlipidemia: HDL 48, LDL 63 on 04/19/14. On simvastatin. No changes. 5. Lower extremity edema: Will d/c HCTZ and prescribe Lasix 20 mg every other day. Will check a BMET in one week to see if he requires supplemental KCl.  Dispo: f/u 6 months.  Lance Grant, M.D., F.A.C.C.

## 2014-12-26 NOTE — Patient Instructions (Signed)
Your physician wants you to follow-up in: 6 months with Dr. Bronson Ing.  You will receive a reminder letter in the mail two months in advance. If you don't receive a letter, please call our office to schedule the follow-up appointment.  Your physician has recommended you make the following change in your medication:   Start : Lasix 20 mg every other day  Stop : Hydrochlorothiazide   Your physician recommends that you return for lab work in: 1 week ( BMET)  Thank you for choosing [facility]!

## 2015-01-02 DIAGNOSIS — I10 Essential (primary) hypertension: Secondary | ICD-10-CM | POA: Diagnosis not present

## 2015-01-03 ENCOUNTER — Telehealth: Payer: Self-pay

## 2015-01-03 ENCOUNTER — Telehealth: Payer: Self-pay | Admitting: *Deleted

## 2015-01-03 DIAGNOSIS — I10 Essential (primary) hypertension: Secondary | ICD-10-CM

## 2015-01-03 LAB — BASIC METABOLIC PANEL
BUN: 28 mg/dL — ABNORMAL HIGH (ref 6–23)
CO2: 26 mEq/L (ref 19–32)
Calcium: 9 mg/dL (ref 8.4–10.5)
Chloride: 105 mEq/L (ref 96–112)
Creat: 1.79 mg/dL — ABNORMAL HIGH (ref 0.50–1.35)
Glucose, Bld: 63 mg/dL — ABNORMAL LOW (ref 70–99)
Potassium: 4.8 mEq/L (ref 3.5–5.3)
SODIUM: 140 meq/L (ref 135–145)

## 2015-01-03 MED ORDER — FUROSEMIDE 20 MG PO TABS: ORAL_TABLET | ORAL | Status: DC

## 2015-01-03 NOTE — Telephone Encounter (Signed)
-----   Message from Herminio Commons, MD sent at 01/03/2015 11:04 AM EST ----- Switch Lasix to every Monday, Wed, and Fri. Can take extra 20 on weekends prn. Repeat BMET in 3-4 weeks.

## 2015-01-03 NOTE — Telephone Encounter (Signed)
Patient notified of lab results. Patient states he would like [place] to call and notify his son of the results. Attempted to call son, no answer, will try again.

## 2015-01-09 DIAGNOSIS — I10 Essential (primary) hypertension: Secondary | ICD-10-CM | POA: Diagnosis not present

## 2015-01-09 DIAGNOSIS — M79671 Pain in right foot: Secondary | ICD-10-CM | POA: Diagnosis not present

## 2015-01-24 ENCOUNTER — Telehealth: Payer: Self-pay | Admitting: Cardiovascular Disease

## 2015-01-24 DIAGNOSIS — I10 Essential (primary) hypertension: Secondary | ICD-10-CM | POA: Diagnosis not present

## 2015-01-24 MED ORDER — WARFARIN SODIUM 2 MG PO TABS: ORAL_TABLET | ORAL | Status: DC

## 2015-01-24 NOTE — Telephone Encounter (Signed)
Received fax refill request  Rx # [id number] Medication:  Warfarin Sodium 2 mg tablet Qty 90 Sig:  Take 3 tablets daily except 2 tablets on Mondays Physician:  Bronson Ing

## 2015-01-25 ENCOUNTER — Telehealth: Payer: Self-pay | Admitting: Cardiovascular Disease

## 2015-01-25 ENCOUNTER — Ambulatory Visit (INDEPENDENT_AMBULATORY_CARE_PROVIDER_SITE_OTHER): Payer: Medicare Other | Admitting: *Deleted

## 2015-01-25 DIAGNOSIS — I2699 Other pulmonary embolism without acute cor pulmonale: Secondary | ICD-10-CM

## 2015-01-25 DIAGNOSIS — Z5181 Encounter for therapeutic drug level monitoring: Secondary | ICD-10-CM

## 2015-01-25 LAB — BASIC METABOLIC PANEL
BUN: 27 mg/dL — AB (ref 6–23)
CHLORIDE: 108 meq/L (ref 96–112)
CO2: 29 mEq/L (ref 19–32)
CREATININE: 1.85 mg/dL — AB (ref 0.50–1.35)
Calcium: 8.9 mg/dL (ref 8.4–10.5)
Glucose, Bld: 81 mg/dL (ref 70–99)
Potassium: 4.9 mEq/L (ref 3.5–5.3)
Sodium: 139 mEq/L (ref 135–145)

## 2015-01-25 LAB — POCT INR: INR: 2.6

## 2015-01-25 NOTE — Telephone Encounter (Signed)
Sent in yesterday with confirmation by Pharmacy

## 2015-01-25 NOTE — Telephone Encounter (Signed)
Received fax refill request  Rx # [id number]  Medication:  Warfarin Sodium 2 mg tablet Qty 90 Sig:  Take 3 tablets daily except 2 tablets on Mondays Physician:  Bronson Ing

## 2015-02-01 DIAGNOSIS — M79671 Pain in right foot: Secondary | ICD-10-CM | POA: Diagnosis not present

## 2015-02-01 DIAGNOSIS — I10 Essential (primary) hypertension: Secondary | ICD-10-CM | POA: Diagnosis not present

## 2015-02-01 DIAGNOSIS — Z6830 Body mass index (BMI) 30.0-30.9, adult: Secondary | ICD-10-CM | POA: Diagnosis not present

## 2015-02-01 DIAGNOSIS — M25561 Pain in right knee: Secondary | ICD-10-CM | POA: Diagnosis not present

## 2015-03-04 DIAGNOSIS — I2510 Atherosclerotic heart disease of native coronary artery without angina pectoris: Secondary | ICD-10-CM | POA: Diagnosis not present

## 2015-03-04 DIAGNOSIS — Z7901 Long term (current) use of anticoagulants: Secondary | ICD-10-CM | POA: Diagnosis not present

## 2015-03-04 DIAGNOSIS — I10 Essential (primary) hypertension: Secondary | ICD-10-CM | POA: Diagnosis not present

## 2015-03-08 ENCOUNTER — Telehealth: Payer: Self-pay | Admitting: Cardiovascular Disease

## 2015-03-08 ENCOUNTER — Ambulatory Visit (INDEPENDENT_AMBULATORY_CARE_PROVIDER_SITE_OTHER): Payer: Medicare Other | Admitting: *Deleted

## 2015-03-08 DIAGNOSIS — R600 Localized edema: Secondary | ICD-10-CM

## 2015-03-08 DIAGNOSIS — Z6830 Body mass index (BMI) 30.0-30.9, adult: Secondary | ICD-10-CM | POA: Diagnosis not present

## 2015-03-08 DIAGNOSIS — M25561 Pain in right knee: Secondary | ICD-10-CM | POA: Diagnosis not present

## 2015-03-08 DIAGNOSIS — Z5181 Encounter for therapeutic drug level monitoring: Secondary | ICD-10-CM

## 2015-03-08 DIAGNOSIS — I2699 Other pulmonary embolism without acute cor pulmonale: Secondary | ICD-10-CM

## 2015-03-08 DIAGNOSIS — I10 Essential (primary) hypertension: Secondary | ICD-10-CM | POA: Diagnosis not present

## 2015-03-08 DIAGNOSIS — M1711 Unilateral primary osteoarthritis, right knee: Secondary | ICD-10-CM | POA: Diagnosis not present

## 2015-03-08 LAB — POCT INR: INR: 2.7

## 2015-03-08 MED ORDER — FUROSEMIDE 20 MG PO TABS: 20.0000 mg | ORAL_TABLET | Freq: Every day | ORAL | Status: DC

## 2015-03-08 MED ORDER — POTASSIUM CHLORIDE CRYS ER 20 MEQ PO TBCR: EXTENDED_RELEASE_TABLET | ORAL | Status: DC

## 2015-03-08 NOTE — Telephone Encounter (Signed)
Son repeated verbally orders given by Dr Jayme Cloud will have BMET Friday 03/10/15

## 2015-03-08 NOTE — Telephone Encounter (Signed)
Please call patient's son in regards to patient's feet and ankle swelling. No c/o SOB. Please call after 12. / tg

## 2015-03-08 NOTE — Telephone Encounter (Signed)
Pt's son states he saw Dr Berdine Addison and he noted pt had more ankle edema.His wt in office was 189 lbs.When we saw him in February his weight was 180.His current instructions say lasix 20 mg Mon/Wed/Fri and he can also take additional on weekends which patient has not done.   What is your advise ? Will forward to Dr Bronson Ing

## 2015-03-08 NOTE — Telephone Encounter (Signed)
Have him start taking Lasix 40 mg daily x 3 days, followed by 20 mg daily thereafter. Check BMET on Friday. Give him 20 meq KCl on days he is taking 40 mg.

## 2015-03-10 DIAGNOSIS — R609 Edema, unspecified: Secondary | ICD-10-CM | POA: Diagnosis not present

## 2015-03-10 DIAGNOSIS — R600 Localized edema: Secondary | ICD-10-CM | POA: Diagnosis not present

## 2015-03-11 LAB — BASIC METABOLIC PANEL
BUN: 27 mg/dL — AB (ref 6–23)
CHLORIDE: 107 meq/L (ref 96–112)
CO2: 29 mEq/L (ref 19–32)
Calcium: 9.2 mg/dL (ref 8.4–10.5)
Creat: 1.75 mg/dL — ABNORMAL HIGH (ref 0.50–1.35)
GLUCOSE: 72 mg/dL (ref 70–99)
Potassium: 5.2 mEq/L (ref 3.5–5.3)
Sodium: 142 mEq/L (ref 135–145)

## 2015-03-14 ENCOUNTER — Telehealth: Payer: Self-pay | Admitting: Adult Health

## 2015-03-14 NOTE — Telephone Encounter (Signed)
Please call patient's son with lab results / tg

## 2015-04-19 ENCOUNTER — Ambulatory Visit (INDEPENDENT_AMBULATORY_CARE_PROVIDER_SITE_OTHER): Payer: Medicare Other | Admitting: *Deleted

## 2015-04-19 DIAGNOSIS — Z5181 Encounter for therapeutic drug level monitoring: Secondary | ICD-10-CM | POA: Diagnosis not present

## 2015-04-19 DIAGNOSIS — I2699 Other pulmonary embolism without acute cor pulmonale: Secondary | ICD-10-CM

## 2015-04-19 LAB — POCT INR: INR: 2.2

## 2015-04-21 ENCOUNTER — Telehealth: Payer: Self-pay | Admitting: Cardiovascular Disease

## 2015-04-21 DIAGNOSIS — R319 Hematuria, unspecified: Secondary | ICD-10-CM | POA: Diagnosis not present

## 2015-04-21 DIAGNOSIS — Z7901 Long term (current) use of anticoagulants: Secondary | ICD-10-CM | POA: Diagnosis not present

## 2015-04-21 DIAGNOSIS — I10 Essential (primary) hypertension: Secondary | ICD-10-CM | POA: Diagnosis not present

## 2015-04-21 DIAGNOSIS — I2510 Atherosclerotic heart disease of native coronary artery without angina pectoris: Secondary | ICD-10-CM | POA: Diagnosis not present

## 2015-04-21 NOTE — Telephone Encounter (Signed)
The patient's home health aid reported to the patient's son that Lance Grant had blood in his urine during the night. The son is calling to see if this is a possible side effect of coumadin.  Please contact the son at [phone] with any advice. He is not going to call him PCP until someone returns his call. I told him I would reach out to the [facility] in [place] and he should get a returned call today.

## 2015-04-21 NOTE — Telephone Encounter (Signed)
Spoke with pt's son.  States the caregiver noticed blood in pt's urine this morning when she went to check on him.  INR was 2.2 on 6/15 so doubt this is related to elevated INR.  Suggested pt follow up with PCP to rule out infection or other cause of bleeding.  Pt's son has already contacted PCP and they will be able to see pt today for evaluation.

## 2015-05-10 DIAGNOSIS — M25562 Pain in left knee: Secondary | ICD-10-CM | POA: Diagnosis not present

## 2015-05-10 DIAGNOSIS — Z6830 Body mass index (BMI) 30.0-30.9, adult: Secondary | ICD-10-CM | POA: Diagnosis not present

## 2015-05-10 DIAGNOSIS — I10 Essential (primary) hypertension: Secondary | ICD-10-CM | POA: Diagnosis not present

## 2015-05-10 DIAGNOSIS — M25561 Pain in right knee: Secondary | ICD-10-CM | POA: Diagnosis not present

## 2015-05-27 DIAGNOSIS — I10 Essential (primary) hypertension: Secondary | ICD-10-CM | POA: Diagnosis not present

## 2015-05-27 DIAGNOSIS — I2510 Atherosclerotic heart disease of native coronary artery without angina pectoris: Secondary | ICD-10-CM | POA: Diagnosis not present

## 2015-05-29 DIAGNOSIS — I10 Essential (primary) hypertension: Secondary | ICD-10-CM | POA: Diagnosis not present

## 2015-05-29 DIAGNOSIS — I2510 Atherosclerotic heart disease of native coronary artery without angina pectoris: Secondary | ICD-10-CM | POA: Diagnosis not present

## 2015-05-31 ENCOUNTER — Ambulatory Visit (INDEPENDENT_AMBULATORY_CARE_PROVIDER_SITE_OTHER): Payer: Medicare Other | Admitting: *Deleted

## 2015-05-31 DIAGNOSIS — Z5181 Encounter for therapeutic drug level monitoring: Secondary | ICD-10-CM | POA: Diagnosis not present

## 2015-05-31 DIAGNOSIS — I2699 Other pulmonary embolism without acute cor pulmonale: Secondary | ICD-10-CM | POA: Diagnosis not present

## 2015-05-31 LAB — POCT INR: INR: 2.4

## 2015-06-21 DIAGNOSIS — M1711 Unilateral primary osteoarthritis, right knee: Secondary | ICD-10-CM | POA: Diagnosis not present

## 2015-06-21 DIAGNOSIS — Z6830 Body mass index (BMI) 30.0-30.9, adult: Secondary | ICD-10-CM | POA: Diagnosis not present

## 2015-06-21 DIAGNOSIS — I10 Essential (primary) hypertension: Secondary | ICD-10-CM | POA: Diagnosis not present

## 2015-06-25 ENCOUNTER — Other Ambulatory Visit: Payer: Self-pay | Admitting: Cardiovascular Disease

## 2015-07-05 ENCOUNTER — Encounter: Payer: Self-pay | Admitting: Cardiovascular Disease

## 2015-07-05 ENCOUNTER — Ambulatory Visit (INDEPENDENT_AMBULATORY_CARE_PROVIDER_SITE_OTHER): Payer: Medicare Other | Admitting: Cardiovascular Disease

## 2015-07-05 VITALS — BP 142/78 | HR 80 | Ht 69.0 in | Wt 170.4 lb

## 2015-07-05 DIAGNOSIS — R600 Localized edema: Secondary | ICD-10-CM

## 2015-07-05 DIAGNOSIS — I25812 Atherosclerosis of bypass graft of coronary artery of transplanted heart without angina pectoris: Secondary | ICD-10-CM | POA: Diagnosis not present

## 2015-07-05 DIAGNOSIS — I10 Essential (primary) hypertension: Secondary | ICD-10-CM | POA: Diagnosis not present

## 2015-07-05 DIAGNOSIS — I2699 Other pulmonary embolism without acute cor pulmonale: Secondary | ICD-10-CM | POA: Diagnosis not present

## 2015-07-05 DIAGNOSIS — E785 Hyperlipidemia, unspecified: Secondary | ICD-10-CM | POA: Diagnosis not present

## 2015-07-05 NOTE — Progress Notes (Signed)
Patient ID: Lance Grant, male   DOB: Oct 12, 1921, 81 y.o.   MRN: [id number]      SUBJECTIVE: Mr. Kohles is a pleasant 81 yr old male with a PMH significant for pulmonary embolism, CAD s/p CABG, essential hypertension, hyperlipidemia, CKD stage III, and GERD.  He denies chest pain and shortness of breath, as well as palpitations and dizziness.  He and his wife have a nurse who stays with them 4 hours daily.  His wife is currently in the hospital.  He denies chest pain and shortness of breath. His leg swelling isn't bothersome. His attendant tells me it has gone down quite a bit.  He does scratch and has dry skin on his legs with a small right leg wound. She has questions about moisturizing cream.  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  Soc: He grew up in [place], and came to [facility] after he retired from the service. He was initially in the [employer], and then was in [employer]. He attended [employer]. 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    Review of Systems: As per "subjective", otherwise negative.  Allergies  Allergen Reactions  . Erythromycin     Current Outpatient Prescriptions  Medication Sig Dispense Refill  . atenolol (TENORMIN) 25 MG tablet Take 1 tablet (25 mg total) by mouth daily. 90 tablet 1  . donepezil (ARICEPT) 5 MG tablet Take 5 mg by mouth daily.    . furosemide (LASIX) 20 MG tablet Take 1 tablet (20 mg total) by mouth daily. 90 tablet 3  . lisinopril (PRINIVIL,ZESTRIL) 10 MG tablet Take 1 tablet (10 mg total) by mouth daily. 90 tablet 1  . loratadine (CLARITIN) 10 MG tablet Take 10 mg by mouth daily.    . nitroGLYCERIN (NITROLINGUAL) 0.4 MG/SPRAY spray Place 1 spray under the tongue every 5 (five) minutes as needed for chest pain. 12 g 12  . polyethylene glycol powder (GLYCOLAX/MIRALAX) powder Take 17 g by mouth 2 (two) times daily.  255 g 0  . potassium chloride SA (K-DUR,KLOR-CON) 20 MEQ tablet Take 1 tablet 20 meq for 3 days and then STOP 10 tablet 0  . simvastatin (ZOCOR) 20 MG tablet Take 1 tablet (20 mg total) by mouth at bedtime. 90 tablet 1  . traMADol (ULTRAM) 50 MG tablet Take 50 mg by mouth every 6 (six) hours as needed. Maximum dose= 8 tablets per day     . warfarin (COUMADIN) 2 MG tablet TAKE 2 TABLETS DAILY EXCEPT 3 TABLETS ON SATURDAYS 90 tablet 3   No current facility-administered medications for this visit.    Past Medical History  Diagnosis Date  . Hypertension   . COPD (chronic obstructive pulmonary disease)   . Pleural effusion   . Chronic kidney disease, stage III (moderate)     Creatinine-1.9 08/2005, 1.5 02/2007, 1.7 12/2007, 1.7 06/2008  . Seminal vesiculitis   . GERD (gastroesophageal reflux disease)   . Hyperlipidemia   . Sinus bradycardia     When on beta blockers  . Arteriosclerotic cardiovascular disease (ASCVD)     CABG 10/2001 nl EF  . Peripheral vascular disease   . Pulmonary embolism 2007    2007  . Chronic anticoagulation     Managed by [facility]    Past Surgical History  Procedure Laterality Date  . Coronary artery bypass graft  10/2001  .  Transurethral resection of prostate  07/2005    Social History   Social History  . Marital Status: Married    Spouse Name: N/A  . Number of Children: 2  . Years of Education: N/A   Occupational History  . Retired     [employer]/ [employer]  . [employer] for a total of 17 years; recently declined renominationb   Social History Main Topics  . Smoking status: Former Smoker -- 1.00 packs/day for 10 years    Start date: 11/27/1946    Quit date: 11/05/1987  . Smokeless tobacco: Never Used  . Alcohol Use: 4.2 oz/week    7 Cans of beer per week     Comment: 1 can beer/day  . Drug Use: No  . Sexual Activity: Not on file   Other Topics Concern  . Not on file   Social History Narrative    Married   2 children, 2 grandchildren   No regular exercise     Filed Vitals:   07/05/15 1129  BP: 142/78  Pulse: 80  Height: 5\' 9"  (1.753 m)  Weight: 170 lb 6.4 oz (77.293 kg)  SpO2: 97%    PHYSICAL EXAM General: NAD. HEENT: Normal. Neck: No JVD, no thyromegaly. Lungs: Clear to auscultation bilaterally with normal respiratory effort. CV: Nondisplaced PMI. Regular rate and rhythm, normal S1/S2, no M3/N3, soft 1/6 systolic murmur along left sternal border and RUSB. 1+ pretibial and periankle edema, left>right.   Abdomen: Soft, nontender, no distention.  Neurologic: Alert and oriented.  Psych: Normal affect. Skin: Normal. Musculoskeletal: No gross deformities. Extremities: No clubbing or cyanosis.   ECG: Most recent ECG reviewed.      ASSESSMENT AND PLAN: 1. CAD s/p CABG: Symptomatically stable. Continue atenolol and simvastatin.  2. Pulmonary Embolism: On Warfarin.   3. Essential HTN: Reasonably controlled for age on present therapy which includes lisinopril. No changes.  4. Hyperlipidemia: HDL 48, LDL 63 on 04/19/14. On simvastatin 20 mg. No changes.  5. Lower extremity edema: Takes Lasix daily. No changes to dose.  Dispo: f/u 6 months.   [name], M.D., F.A.C.C.

## 2015-07-05 NOTE — Patient Instructions (Signed)
Your physician wants you to follow-up in: 6 months with Dr Bronson Ing at [facility] will receive a reminder letter in the mail two months in advance. If you don't receive a letter, please call our office to schedule the follow-up appointment.    Your physician recommends that you continue on your current medications as directed. Please refer to the Current Medication list given to you today.    Thank you for choosing [facility] !

## 2015-07-11 DIAGNOSIS — R319 Hematuria, unspecified: Secondary | ICD-10-CM | POA: Diagnosis not present

## 2015-07-11 DIAGNOSIS — I10 Essential (primary) hypertension: Secondary | ICD-10-CM | POA: Diagnosis not present

## 2015-07-12 ENCOUNTER — Ambulatory Visit (INDEPENDENT_AMBULATORY_CARE_PROVIDER_SITE_OTHER): Payer: Medicare Other | Admitting: *Deleted

## 2015-07-12 DIAGNOSIS — I2699 Other pulmonary embolism without acute cor pulmonale: Secondary | ICD-10-CM

## 2015-07-12 DIAGNOSIS — Z5181 Encounter for therapeutic drug level monitoring: Secondary | ICD-10-CM

## 2015-07-12 LAB — POCT INR: INR: 2.1

## 2015-08-19 DIAGNOSIS — K219 Gastro-esophageal reflux disease without esophagitis: Secondary | ICD-10-CM | POA: Diagnosis not present

## 2015-08-19 DIAGNOSIS — R197 Diarrhea, unspecified: Secondary | ICD-10-CM | POA: Diagnosis not present

## 2015-08-19 DIAGNOSIS — R05 Cough: Secondary | ICD-10-CM | POA: Diagnosis not present

## 2015-08-30 DIAGNOSIS — Z6830 Body mass index (BMI) 30.0-30.9, adult: Secondary | ICD-10-CM | POA: Diagnosis not present

## 2015-08-30 DIAGNOSIS — I10 Essential (primary) hypertension: Secondary | ICD-10-CM | POA: Diagnosis not present

## 2015-08-30 DIAGNOSIS — M1711 Unilateral primary osteoarthritis, right knee: Secondary | ICD-10-CM | POA: Diagnosis not present

## 2015-08-30 DIAGNOSIS — M25561 Pain in right knee: Secondary | ICD-10-CM | POA: Diagnosis not present

## 2015-09-04 ENCOUNTER — Ambulatory Visit (INDEPENDENT_AMBULATORY_CARE_PROVIDER_SITE_OTHER): Payer: Medicare Other | Admitting: *Deleted

## 2015-09-04 DIAGNOSIS — I2699 Other pulmonary embolism without acute cor pulmonale: Secondary | ICD-10-CM

## 2015-09-04 DIAGNOSIS — Z5181 Encounter for therapeutic drug level monitoring: Secondary | ICD-10-CM

## 2015-09-04 LAB — POCT INR: INR: 2.3

## 2015-09-20 DIAGNOSIS — I10 Essential (primary) hypertension: Secondary | ICD-10-CM | POA: Diagnosis not present

## 2015-09-20 DIAGNOSIS — Z6830 Body mass index (BMI) 30.0-30.9, adult: Secondary | ICD-10-CM | POA: Diagnosis not present

## 2015-09-20 DIAGNOSIS — M25561 Pain in right knee: Secondary | ICD-10-CM | POA: Diagnosis not present

## 2015-10-02 DIAGNOSIS — J3089 Other allergic rhinitis: Secondary | ICD-10-CM | POA: Diagnosis not present

## 2015-10-02 DIAGNOSIS — Z6825 Body mass index (BMI) 25.0-25.9, adult: Secondary | ICD-10-CM | POA: Diagnosis not present

## 2015-10-02 DIAGNOSIS — M25561 Pain in right knee: Secondary | ICD-10-CM | POA: Diagnosis not present

## 2015-10-03 ENCOUNTER — Other Ambulatory Visit (HOSPITAL_COMMUNITY): Payer: Self-pay | Admitting: Family Medicine

## 2015-10-03 DIAGNOSIS — R310 Gross hematuria: Secondary | ICD-10-CM

## 2015-10-06 ENCOUNTER — Ambulatory Visit (HOSPITAL_COMMUNITY)
Admission: RE | Admit: 2015-10-06 | Discharge: 2015-10-06 | Disposition: A | Discharge status: Home / Self Care | Payer: Medicare Other | Source: Ambulatory Visit | Attending: Family Medicine | Admitting: Family Medicine

## 2015-10-06 DIAGNOSIS — N281 Cyst of kidney, acquired: Secondary | ICD-10-CM | POA: Diagnosis not present

## 2015-10-06 DIAGNOSIS — N2889 Other specified disorders of kidney and ureter: Secondary | ICD-10-CM | POA: Diagnosis not present

## 2015-10-06 DIAGNOSIS — R310 Gross hematuria: Secondary | ICD-10-CM | POA: Insufficient documentation

## 2015-10-16 ENCOUNTER — Ambulatory Visit (INDEPENDENT_AMBULATORY_CARE_PROVIDER_SITE_OTHER): Payer: Medicare Other | Admitting: *Deleted

## 2015-10-16 ENCOUNTER — Telehealth: Payer: Self-pay | Admitting: *Deleted

## 2015-10-16 DIAGNOSIS — I2699 Other pulmonary embolism without acute cor pulmonale: Secondary | ICD-10-CM | POA: Diagnosis not present

## 2015-10-16 DIAGNOSIS — Z5181 Encounter for therapeutic drug level monitoring: Secondary | ICD-10-CM | POA: Diagnosis not present

## 2015-10-16 LAB — POCT INR: INR: 3.4

## 2015-10-16 NOTE — Telephone Encounter (Signed)
NOREL AD TAB takes half of one as needed for runny nose. pts son said you could reach him at [phone]

## 2015-10-16 NOTE — Telephone Encounter (Signed)
Spoke with son.  Noted pt is taking this med.

## 2015-10-16 NOTE — Telephone Encounter (Signed)
Pt's son called back stating instead of it being as needed, it's daily

## 2015-11-01 DIAGNOSIS — M25561 Pain in right knee: Secondary | ICD-10-CM | POA: Diagnosis not present

## 2015-11-01 DIAGNOSIS — I10 Essential (primary) hypertension: Secondary | ICD-10-CM | POA: Diagnosis not present

## 2015-11-01 DIAGNOSIS — Z6830 Body mass index (BMI) 30.0-30.9, adult: Secondary | ICD-10-CM | POA: Diagnosis not present

## 2015-11-01 DIAGNOSIS — M25562 Pain in left knee: Secondary | ICD-10-CM | POA: Diagnosis not present

## 2015-11-04 DIAGNOSIS — I10 Essential (primary) hypertension: Secondary | ICD-10-CM | POA: Diagnosis not present

## 2015-11-04 DIAGNOSIS — Z7901 Long term (current) use of anticoagulants: Secondary | ICD-10-CM | POA: Diagnosis not present

## 2015-11-04 DIAGNOSIS — I2510 Atherosclerotic heart disease of native coronary artery without angina pectoris: Secondary | ICD-10-CM | POA: Diagnosis not present

## 2015-11-08 ENCOUNTER — Ambulatory Visit (INDEPENDENT_AMBULATORY_CARE_PROVIDER_SITE_OTHER): Payer: Medicare Other | Admitting: *Deleted

## 2015-11-08 DIAGNOSIS — Z5181 Encounter for therapeutic drug level monitoring: Secondary | ICD-10-CM | POA: Diagnosis not present

## 2015-11-08 DIAGNOSIS — I2699 Other pulmonary embolism without acute cor pulmonale: Secondary | ICD-10-CM | POA: Diagnosis not present

## 2015-11-08 LAB — POCT INR: INR: 1.7

## 2015-11-29 ENCOUNTER — Ambulatory Visit (INDEPENDENT_AMBULATORY_CARE_PROVIDER_SITE_OTHER): Payer: Medicare Other | Admitting: *Deleted

## 2015-11-29 DIAGNOSIS — Z5181 Encounter for therapeutic drug level monitoring: Secondary | ICD-10-CM | POA: Diagnosis not present

## 2015-11-29 DIAGNOSIS — I2699 Other pulmonary embolism without acute cor pulmonale: Secondary | ICD-10-CM

## 2015-11-29 LAB — POCT INR: INR: 4

## 2015-12-13 ENCOUNTER — Ambulatory Visit (INDEPENDENT_AMBULATORY_CARE_PROVIDER_SITE_OTHER): Payer: Medicare Other | Admitting: *Deleted

## 2015-12-13 DIAGNOSIS — I2699 Other pulmonary embolism without acute cor pulmonale: Secondary | ICD-10-CM | POA: Diagnosis not present

## 2015-12-13 DIAGNOSIS — Z5181 Encounter for therapeutic drug level monitoring: Secondary | ICD-10-CM

## 2015-12-13 LAB — POCT INR: INR: 3

## 2015-12-27 ENCOUNTER — Ambulatory Visit (INDEPENDENT_AMBULATORY_CARE_PROVIDER_SITE_OTHER): Payer: Medicare Other | Admitting: Orthopaedic Surgery

## 2015-12-27 ENCOUNTER — Encounter: Payer: Self-pay | Admitting: Orthopaedic Surgery

## 2015-12-27 VITALS — BP 164/89 | HR 62 | Temp 97.7°F | Ht 69.0 in | Wt 163.4 lb

## 2015-12-27 DIAGNOSIS — L608 Other nail disorders: Secondary | ICD-10-CM

## 2015-12-27 DIAGNOSIS — M25561 Pain in right knee: Secondary | ICD-10-CM | POA: Diagnosis not present

## 2015-12-27 DIAGNOSIS — L609 Nail disorder, unspecified: Secondary | ICD-10-CM | POA: Diagnosis not present

## 2015-12-27 NOTE — Patient Instructions (Signed)
Continue being active, use cane.  Call if any problem.

## 2015-12-27 NOTE — Progress Notes (Signed)
Patient Lance Grant:[id number] Lance Grant, male DOB:[birth year], 82 y.o. Lance Grant:[id number]  Chief Complaint  Patient presents with  . Follow-up    Right knee pain    HPI  Lance Grant is a 82 y.o. male who has chronic recurrent knee pain on the right.  He has swelling and popping but no giving way, no locking, no redness.  Knee Pain  There was no injury mechanism. The pain is present in the right knee. The quality of the pain is described as aching. The pain is at a severity of 3/10. The pain is mild. The pain has been fluctuating since onset. Associated symptoms include an inability to bear weight and a loss of motion. Pertinent negatives include no loss of sensation, muscle weakness, numbness or tingling. The symptoms are aggravated by weight bearing. He has tried elevation, NSAIDs and rest for the symptoms. The treatment provided moderate relief.    Body mass index is 24.12 kg/(m^2).  Review of Systems  Constitutional:       Patient does not have Diabetes Mellitus. Patient has hypertension. Patient has COPD or shortness of breath. Patient does not have BMI > 35. Patient does not have current smoking history.   Cardiovascular:       History of hypertension, Arteriosclerotic cardiovascular disease, peripheral vascular disease.  Neurological: Negative for tingling and numbness.    Past Medical History  Diagnosis Date  . Hypertension   . COPD (chronic obstructive pulmonary disease) ([place])   . Pleural effusion   . Chronic kidney disease, stage III (moderate)     Creatinine-1.9 08/2005, 1.5 02/2007, 1.7 12/2007, 1.7 06/2008  . Seminal vesiculitis   . GERD (gastroesophageal reflux disease)   . Hyperlipidemia   . Sinus bradycardia     When on beta blockers  . Arteriosclerotic cardiovascular disease (ASCVD)     CABG 10/2001 nl EF  . Peripheral vascular disease ([place])   . Pulmonary embolism [facility]) 2007    2007  . Chronic anticoagulation     Managed by [facility]    Past Surgical History   Procedure Laterality Date  . Coronary artery bypass graft  10/2001  . Transurethral resection of prostate  07/2005    History reviewed. No pertinent family history.  Social History Social History  Substance Use Topics  . Smoking status: Former Smoker -- 1.00 packs/day for 10 years    Start date: 11/27/1946    Quit date: 11/05/1987  . Smokeless tobacco: Never Used  . Alcohol Use: 4.2 oz/week    7 Cans of beer per week     Comment: 1 can beer/day    Allergies  Allergen Reactions  . Erythromycin     Current Outpatient Prescriptions  Medication Sig Dispense Refill  . atenolol (TENORMIN) 25 MG tablet Take 1 tablet (25 mg total) by mouth daily. 90 tablet 1  . donepezil (ARICEPT) 5 MG tablet Take 5 mg by mouth daily.    . furosemide (LASIX) 20 MG tablet Take 1 tablet (20 mg total) by mouth daily. 90 tablet 3  . lisinopril (PRINIVIL,ZESTRIL) 10 MG tablet Take 1 tablet (10 mg total) by mouth daily. 90 tablet 1  . loratadine (CLARITIN) 10 MG tablet Take 10 mg by mouth daily.    . nitroGLYCERIN (NITROLINGUAL) 0.4 MG/SPRAY spray Place 1 spray under the tongue every 5 (five) minutes as needed for chest pain. 12 g 12  . pantoprazole (PROTONIX) 40 MG tablet Take 40 mg by mouth.    . polyethylene glycol powder (GLYCOLAX/MIRALAX) powder Take  17 g by mouth 2 (two) times daily. 255 g 0  . potassium chloride SA (K-DUR,KLOR-CON) 20 MEQ tablet Take 1 tablet 20 meq for 3 days and then STOP 10 tablet 0  . simvastatin (ZOCOR) 20 MG tablet Take 1 tablet (20 mg total) by mouth at bedtime. 90 tablet 1  . traMADol (ULTRAM) 50 MG tablet Take 50 mg by mouth every 6 (six) hours as needed. Maximum dose= 8 tablets per day     . warfarin (COUMADIN) 2 MG tablet TAKE 2 TABLETS DAILY EXCEPT 3 TABLETS ON SATURDAYS 90 tablet 3   No current facility-administered medications for this visit.     Physical Exam  Blood pressure 164/89, pulse 62, temperature 97.7 F (36.5 C), height 5\' 9"  (1.753 m), weight 163 lb  6.4 oz (74.118 kg).  Constitutional: overall normal hygiene, normal nutrition, well developed, normal grooming, normal body habitus. Assistive device:cane  Musculoskeletal: gait and station Limp right, muscle tone and strength are normal, no tremors or atrophy is present.  .  Neurological: coordination overall normal.  Deep tendon reflex/nerve stretch intact.  Sensation normal.  Cranial nerves II-XII intact.   Skin:   normal overall no scars, lesions, ulcers or rashes. No psoriasis.  Psychiatric: Alert and oriented x 3.  Recent memory intact, remote memory unclear.  Normal mood and affect. Well groomed.  Good eye contact.  Cardiovascular: overall no swelling, no varicosities, no edema bilaterally, normal temperatures of the legs and arms, no clubbing, cyanosis and good capillary refill.  Lymphatic: palpation is normal.  The right lower extremity is examined:  Inspection:  Thigh:  Non-tender and no defects  Knee has swelling 1+ effusion.                        Joint tenderness is present                        Patient is tender over the medial joint line  Lower Leg:  Has normal appearance and no tenderness or defects  Ankle:  Non-tender and no defects  Foot:  Non-tender and no defects Range of Motion:  Knee:  Range of motion is: 0-100                        Crepitus is  present  Ankle:  Range of motion is normal. Strength and Tone:  The right lower extremity has normal strength and tone. Stability:  Knee:  The knee is stable.  Ankle:  The ankle is stable.  Additional services performed: I pared his toe nails while he was here.  The patient has been educated about the nature of the problem(s) and counseled on treatment options.  The patient appeared to understand what I have discussed and is in agreement with it.  PLAN Call if any problems.  Precautions discussed.  Continue current medications.   Return to clinic 2 months.

## 2016-01-01 ENCOUNTER — Other Ambulatory Visit: Payer: Self-pay | Admitting: *Deleted

## 2016-01-01 MED ORDER — WARFARIN SODIUM 2 MG PO TABS: ORAL_TABLET | ORAL | Status: DC

## 2016-01-02 ENCOUNTER — Other Ambulatory Visit: Payer: Self-pay

## 2016-01-02 ENCOUNTER — Other Ambulatory Visit: Payer: Self-pay | Admitting: *Deleted

## 2016-01-02 MED ORDER — FUROSEMIDE 20 MG PO TABS: 20.0000 mg | ORAL_TABLET | Freq: Every day | ORAL | Status: DC

## 2016-01-02 MED ORDER — WARFARIN SODIUM 2 MG PO TABS: ORAL_TABLET | ORAL | Status: DC

## 2016-01-02 NOTE — Telephone Encounter (Signed)
Refilled Rx for lasix 20 mg

## 2016-01-10 ENCOUNTER — Ambulatory Visit (INDEPENDENT_AMBULATORY_CARE_PROVIDER_SITE_OTHER): Payer: Medicare Other | Admitting: Pharmacist

## 2016-01-10 DIAGNOSIS — I2699 Other pulmonary embolism without acute cor pulmonale: Secondary | ICD-10-CM | POA: Diagnosis not present

## 2016-01-10 DIAGNOSIS — Z5181 Encounter for therapeutic drug level monitoring: Secondary | ICD-10-CM

## 2016-01-10 LAB — POCT INR: INR: 2.7

## 2016-01-22 DIAGNOSIS — Z6824 Body mass index (BMI) 24.0-24.9, adult: Secondary | ICD-10-CM | POA: Diagnosis not present

## 2016-01-22 DIAGNOSIS — N183 Chronic kidney disease, stage 3 (moderate): Secondary | ICD-10-CM | POA: Diagnosis not present

## 2016-01-22 DIAGNOSIS — I10 Essential (primary) hypertension: Secondary | ICD-10-CM | POA: Diagnosis not present

## 2016-01-24 DIAGNOSIS — I259 Chronic ischemic heart disease, unspecified: Secondary | ICD-10-CM | POA: Diagnosis not present

## 2016-01-24 DIAGNOSIS — N183 Chronic kidney disease, stage 3 (moderate): Secondary | ICD-10-CM | POA: Diagnosis not present

## 2016-02-07 ENCOUNTER — Ambulatory Visit (INDEPENDENT_AMBULATORY_CARE_PROVIDER_SITE_OTHER): Payer: Medicare Other | Admitting: *Deleted

## 2016-02-07 DIAGNOSIS — I2699 Other pulmonary embolism without acute cor pulmonale: Secondary | ICD-10-CM

## 2016-02-07 DIAGNOSIS — Z5181 Encounter for therapeutic drug level monitoring: Secondary | ICD-10-CM | POA: Diagnosis not present

## 2016-02-07 LAB — POCT INR: INR: 3

## 2016-02-21 ENCOUNTER — Ambulatory Visit (INDEPENDENT_AMBULATORY_CARE_PROVIDER_SITE_OTHER): Payer: Medicare Other | Admitting: Orthopaedic Surgery

## 2016-02-21 ENCOUNTER — Encounter: Payer: Self-pay | Admitting: Orthopaedic Surgery

## 2016-02-21 VITALS — BP 157/93 | HR 64 | Temp 97.9°F | Resp 16 | Ht 69.0 in | Wt 165.0 lb

## 2016-02-21 DIAGNOSIS — M25561 Pain in right knee: Secondary | ICD-10-CM

## 2016-02-21 DIAGNOSIS — I10 Essential (primary) hypertension: Secondary | ICD-10-CM

## 2016-02-21 DIAGNOSIS — L609 Nail disorder, unspecified: Secondary | ICD-10-CM

## 2016-02-21 DIAGNOSIS — I2510 Atherosclerotic heart disease of native coronary artery without angina pectoris: Secondary | ICD-10-CM | POA: Diagnosis not present

## 2016-02-21 DIAGNOSIS — L608 Other nail disorders: Secondary | ICD-10-CM

## 2016-02-21 DIAGNOSIS — J410 Simple chronic bronchitis: Secondary | ICD-10-CM

## 2016-02-21 NOTE — Progress Notes (Signed)
Patient ID: Lance Grant, male   DOB: Jul 24, 1921, 82 y.o.   MRN: [id number]  CC:  I have pain of my right knee. I would like an injection.  The patient has chronic pain of the right knee.  There is no recent trauma.  There is no redness.  Injections in the past have helped.  The knee has no redness, has an effusion and crepitus present.  ROM of the knee is 0-100.  Impression:  Chronic knee pain right.  Return: two months  PROCEDURE NOTE:  The patient requests injections of the right knee , verbal consent was obtained.  The right knee was prepped appropriately after time out was performed.   Sterile technique was observed and injection of 1 cc of Depo-Medrol 40 mg with several cc's of plain xylocaine. Anesthesia was provided by ethyl chloride and a 20-gauge needle was used to inject the knee area. The injection was tolerated well.  A band aid dressing was applied.  The patient was advised to apply ice later today and tomorrow to the injection sight as needed.

## 2016-03-06 ENCOUNTER — Ambulatory Visit (INDEPENDENT_AMBULATORY_CARE_PROVIDER_SITE_OTHER): Payer: Medicare Other | Admitting: *Deleted

## 2016-03-06 DIAGNOSIS — I2699 Other pulmonary embolism without acute cor pulmonale: Secondary | ICD-10-CM | POA: Diagnosis not present

## 2016-03-06 DIAGNOSIS — Z5181 Encounter for therapeutic drug level monitoring: Secondary | ICD-10-CM | POA: Diagnosis not present

## 2016-03-06 LAB — POCT INR: INR: 2.8

## 2016-03-08 ENCOUNTER — Ambulatory Visit (INDEPENDENT_AMBULATORY_CARE_PROVIDER_SITE_OTHER): Payer: Medicare Other | Admitting: Cardiovascular Disease

## 2016-03-08 ENCOUNTER — Encounter: Payer: Self-pay | Admitting: Cardiovascular Disease

## 2016-03-08 VITALS — BP 130/60 | HR 62 | Ht 69.0 in | Wt 163.0 lb

## 2016-03-08 DIAGNOSIS — I25810 Atherosclerosis of coronary artery bypass graft(s) without angina pectoris: Secondary | ICD-10-CM | POA: Diagnosis not present

## 2016-03-08 DIAGNOSIS — I2510 Atherosclerotic heart disease of native coronary artery without angina pectoris: Secondary | ICD-10-CM | POA: Diagnosis not present

## 2016-03-08 DIAGNOSIS — E785 Hyperlipidemia, unspecified: Secondary | ICD-10-CM

## 2016-03-08 DIAGNOSIS — I10 Essential (primary) hypertension: Secondary | ICD-10-CM

## 2016-03-08 DIAGNOSIS — I4891 Unspecified atrial fibrillation: Secondary | ICD-10-CM

## 2016-03-08 DIAGNOSIS — Z7901 Long term (current) use of anticoagulants: Secondary | ICD-10-CM | POA: Diagnosis not present

## 2016-03-08 DIAGNOSIS — R600 Localized edema: Secondary | ICD-10-CM

## 2016-03-08 DIAGNOSIS — I2782 Chronic pulmonary embolism: Secondary | ICD-10-CM

## 2016-03-08 NOTE — Progress Notes (Signed)
Patient ID: NYRELL KNEELAND, male   DOB: 05-12-1921, 82 y.o.   MRN: [id number]      SUBJECTIVE: Mr. Monigold is a pleasant 82 yr old male with a PMH significant for pulmonary embolism, CAD s/p CABG, essential hypertension, hyperlipidemia, CKD stage III, and GERD.  He denies chest pain and shortness of breath, as well as palpitations and dizziness.  He and his wife have a nurse who stays with them 4 hours daily. Daughter present.  He has chronic leg swelling but it doesn't bother him. Takes Lasix 20 mg daily.  ECG performed in the office today which I personally interpreted demonstrates sinus rhythm with LVH and repolarization abnormalities.   Soc: He grew up in [place], and came to [facility] after he retired from the service. He was initially in the [employer], and then was in [employer]. He attended [employer]. 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    Review of Systems: As per "subjective", otherwise negative.  Allergies  Allergen Reactions  . Erythromycin     Current Outpatient Prescriptions  Medication Sig Dispense Refill  . atenolol (TENORMIN) 25 MG tablet Take 1 tablet (25 mg total) by mouth daily. 90 tablet 1  . donepezil (ARICEPT) 5 MG tablet Take 5 mg by mouth daily.    . furosemide (LASIX) 20 MG tablet Take 1 tablet (20 mg total) by mouth daily. 90 tablet 3  . lisinopril (PRINIVIL,ZESTRIL) 10 MG tablet Take 1 tablet (10 mg total) by mouth daily. 90 tablet 1  . loratadine (CLARITIN) 10 MG tablet Take 10 mg by mouth daily.    . nitroGLYCERIN (NITROLINGUAL) 0.4 MG/SPRAY spray Place 1 spray under the tongue every 5 (five) minutes as needed for chest pain. 12 g 12  . pantoprazole (PROTONIX) 40 MG tablet Take 40 mg by mouth.    . polyethylene glycol powder (GLYCOLAX/MIRALAX) powder Take 17 g by mouth 2 (two) times daily. 255 g 0  . potassium chloride SA (K-DUR,KLOR-CON) 20 MEQ tablet Take 1  tablet 20 meq for 3 days and then STOP 10 tablet 0  . simvastatin (ZOCOR) 20 MG tablet Take 1 tablet (20 mg total) by mouth at bedtime. 90 tablet 1  . traMADol (ULTRAM) 50 MG tablet Take 50 mg by mouth every 6 (six) hours as needed. Maximum dose= 8 tablets per day     . warfarin (COUMADIN) 2 MG tablet Take 2 tablets daily except 3 tablets on Wednesdays 90 tablet 3   No current facility-administered medications for this visit.    Past Medical History  Diagnosis Date  . Hypertension   . COPD (chronic obstructive pulmonary disease) ([place])   . Pleural effusion   . Chronic kidney disease, stage III (moderate)     Creatinine-1.9 08/2005, 1.5 02/2007, 1.7 12/2007, 1.7 06/2008  . Seminal vesiculitis   . GERD (gastroesophageal reflux disease)   . Hyperlipidemia   . Sinus bradycardia     When on beta blockers  . Arteriosclerotic cardiovascular disease (ASCVD)     CABG 10/2001 nl EF  . Peripheral vascular disease ([place])   . Pulmonary embolism [facility]) 2007    2007  . Chronic anticoagulation     Managed by [facility]    Past Surgical History  Procedure Laterality Date  . Coronary artery bypass graft  10/2001  . Transurethral resection of prostate  07/2005    Social History   Social History  .  Marital Status: Married    Spouse Name: N/A  . Number of Children: 2  . Years of Education: N/A   Occupational History  . Retired     [employer]  . [employer] for a total of 17 years; recently declined renominationb   Social History Main Topics  . Smoking status: Former Smoker -- 1.00 packs/day for 10 years    Start date: 11/27/1946    Quit date: 11/05/1987  . Smokeless tobacco: Never Used  . Alcohol Use: 4.2 oz/week    7 Cans of beer per week     Comment: 1 can beer/day  . Drug Use: No  . Sexual Activity: Not on file   Other Topics Concern  . Not on file   Social History Narrative   Married   2 children, 2 grandchildren   No regular  exercise     Filed Vitals:   03/08/16 1129  BP: 130/60  Pulse: 62  Height: 5\' 9"  (1.753 m)  Weight: 163 lb (73.936 kg)  SpO2: 98%    PHYSICAL EXAM General: NAD. HEENT: Normal. Neck: No JVD, no thyromegaly. Lungs: Clear to auscultation bilaterally with normal respiratory effort. CV: Nondisplaced PMI. Regular rate and rhythm, normal S1/S2, no [id number], soft 1/6 systolic murmur along left sternal border and RUSB. 1+ pretibial and periankle edema, b/l. Abdomen: Soft, nontender, no distention.  Neurologic: Alert and oriented.  Psych: Normal affect. Skin: Normal. Musculoskeletal: No gross deformities. Extremities: No clubbing or cyanosis.    ECG: Most recent ECG reviewed.      ASSESSMENT AND PLAN: 1. CAD s/p CABG: Symptomatically stable. Continue atenolol and simvastatin.  2. Pulmonary embolism: On Warfarin.   3. Essential HTN: Controlled. No changes.  4. Hyperlipidemia: On simvastatin 20 mg. No changes.  5. Lower extremity edema: Takes Lasix daily. No changes to dose. Will prescribe knee high compression stockings to be used prn.  Dispo: f/u 6 months.   [name], M.D., F.A.C.C.

## 2016-03-08 NOTE — Patient Instructions (Signed)
Your physician wants you to follow-up in: 6 months You will receive a reminder letter in the mail two months in advance. If you don't receive a letter, please call our office to schedule the follow-up appointment.   Your physician recommends that you continue on your current medications as directed. Please refer to the Current Medication list given to you today.    Please get compression stockings     Thank you for choosing [facility] !

## 2016-03-28 DIAGNOSIS — R319 Hematuria, unspecified: Secondary | ICD-10-CM | POA: Diagnosis not present

## 2016-03-29 ENCOUNTER — Encounter (HOSPITAL_COMMUNITY): Payer: Self-pay | Admitting: Emergency Medicine

## 2016-03-29 ENCOUNTER — Emergency Department (HOSPITAL_COMMUNITY): Payer: Medicare Other

## 2016-03-29 ENCOUNTER — Telehealth: Payer: Self-pay

## 2016-03-29 ENCOUNTER — Emergency Department (HOSPITAL_COMMUNITY)
Admission: EM | Admit: 2016-03-29 | Discharge: 2016-03-29 | Disposition: A | Discharge status: Home / Self Care | Payer: Medicare Other | Attending: Emergency Medicine | Admitting: Emergency Medicine

## 2016-03-29 DIAGNOSIS — E785 Hyperlipidemia, unspecified: Secondary | ICD-10-CM | POA: Insufficient documentation

## 2016-03-29 DIAGNOSIS — I2510 Atherosclerotic heart disease of native coronary artery without angina pectoris: Secondary | ICD-10-CM | POA: Diagnosis not present

## 2016-03-29 DIAGNOSIS — J449 Chronic obstructive pulmonary disease, unspecified: Secondary | ICD-10-CM | POA: Diagnosis not present

## 2016-03-29 DIAGNOSIS — N183 Chronic kidney disease, stage 3 (moderate): Secondary | ICD-10-CM | POA: Diagnosis not present

## 2016-03-29 DIAGNOSIS — M25572 Pain in left ankle and joints of left foot: Secondary | ICD-10-CM | POA: Diagnosis not present

## 2016-03-29 DIAGNOSIS — E1151 Type 2 diabetes mellitus with diabetic peripheral angiopathy without gangrene: Secondary | ICD-10-CM | POA: Diagnosis not present

## 2016-03-29 DIAGNOSIS — I129 Hypertensive chronic kidney disease with stage 1 through stage 4 chronic kidney disease, or unspecified chronic kidney disease: Secondary | ICD-10-CM | POA: Diagnosis not present

## 2016-03-29 DIAGNOSIS — Z87891 Personal history of nicotine dependence: Secondary | ICD-10-CM | POA: Insufficient documentation

## 2016-03-29 DIAGNOSIS — M79605 Pain in left leg: Secondary | ICD-10-CM | POA: Diagnosis not present

## 2016-03-29 DIAGNOSIS — Z79899 Other long term (current) drug therapy: Secondary | ICD-10-CM | POA: Diagnosis not present

## 2016-03-29 DIAGNOSIS — M7989 Other specified soft tissue disorders: Secondary | ICD-10-CM | POA: Diagnosis present

## 2016-03-29 DIAGNOSIS — M79662 Pain in left lower leg: Secondary | ICD-10-CM | POA: Diagnosis not present

## 2016-03-29 DIAGNOSIS — R600 Localized edema: Secondary | ICD-10-CM | POA: Diagnosis not present

## 2016-03-29 DIAGNOSIS — M79672 Pain in left foot: Secondary | ICD-10-CM | POA: Diagnosis not present

## 2016-03-29 HISTORY — DX: Other specified soft tissue disorders: M79.89

## 2016-03-29 LAB — PROTIME-INR
INR: 2.95 — ABNORMAL HIGH (ref 0.00–1.49)
Prothrombin Time: 30.2 seconds — ABNORMAL HIGH (ref 11.6–15.2)

## 2016-03-29 NOTE — Discharge Instructions (Signed)
Take your usual prescriptions as previously directed. Elevate your legs as much as possible. Take over the counter tylenol, as directed on packaging, as needed for discomfort. Call your regular medical doctor on Monday to schedule a follow up appointment within the next 3 days.  Return to the Emergency Department immediately sooner if worsening.

## 2016-03-29 NOTE — ED Notes (Signed)
Pt sent over by PCP to rule out a DVT to pt's LT leg. Pt reports edema and pain to LT leg. States that he has been wearing compression socks with no relief. Pt hx of DVT. Pt is on Coumadin at this time.

## 2016-03-29 NOTE — Telephone Encounter (Signed)
Pt has had compression stockings for 2 days and son noted swelling in right ankle with pain in his calf.Has hx of PE, advised to go to ED for evaulation      eill forward to dr Bronson Ing

## 2016-03-29 NOTE — ED Notes (Signed)
Pt alert & oriented x4. Patient given discharge instructions, paperwork & prescription(s). Patient instructed to stop at the registration desk to finish any additional paperwork. Patient verbalized understanding. Pt left department in wheelchair w/ no further questions.

## 2016-03-29 NOTE — ED Provider Notes (Signed)
CSN: [id number]     Arrival date & time 03/29/16  1641 History   First MD Initiated Contact with Patient 03/29/16 1707     Chief Complaint  Patient presents with  . Leg Swelling     HPI Pt was seen at 1715.  Per pt and his family, c/o gradual onset and persistence of constant left lower leg "swelling" and "pain" for the past 2 weeks, worse over the past several days. Pt has hx of peripheral edema, has been wearing compression hose on the advice of his Cards MD. Pt's family states when they took the hose off his LLE today, pt c/o left calf and ankle "pains." Pt has hx of PE, currently taking coumadin. Last coumadin check last month was "ok" per family. Denies injury, no deformity, no focal motor weakness, no tingling/numbness in extremities, no back pain, no abd pain, no CP/SOB, no fevers, no rash.    Past Medical History  Diagnosis Date  . Hypertension   . COPD (chronic obstructive pulmonary disease) ([place])   . Pleural effusion   . Chronic kidney disease, stage III (moderate)     Creatinine-1.9 08/2005, 1.5 02/2007, 1.7 12/2007, 1.7 06/2008  . Seminal vesiculitis   . GERD (gastroesophageal reflux disease)   . Hyperlipidemia   . Sinus bradycardia     When on beta blockers  . Arteriosclerotic cardiovascular disease (ASCVD)     CABG 10/2001 nl EF  . Peripheral vascular disease ([place])   . Pulmonary embolism [facility]) 2007    2007  . Chronic anticoagulation     Managed by [facility]   Past Surgical History  Procedure Laterality Date  . Coronary artery bypass graft  10/2001  . Transurethral resection of prostate  07/2005    Social History  Substance Use Topics  . Smoking status: Former Smoker -- 1.00 packs/day for 10 years    Start date: 11/27/1946    Quit date: 11/05/1987  . Smokeless tobacco: Never Used  . Alcohol Use: 4.2 oz/week    7 Cans of beer per week     Comment: 1 can beer/day    Review of Systems ROS: Statement: All systems negative except as marked or noted in the HPI;  Constitutional: Negative for fever and chills. ; ; Eyes: Negative for eye pain, redness and discharge. ; ; ENMT: Negative for ear pain, hoarseness, nasal congestion, sinus pressure and sore throat. ; ; Cardiovascular: Negative for chest pain, palpitations, diaphoresis, dyspnea. ; ; Respiratory: Negative for cough, wheezing and stridor. ; ; Gastrointestinal: Negative for nausea, vomiting, diarrhea, abdominal pain, blood in stool, hematemesis, jaundice and rectal bleeding. . ; ; Genitourinary: Negative for dysuria, flank pain and hematuria. ; ; Musculoskeletal: +left lower leg pain and swelling. Negative for back pain and neck pain. Negative for trauma.; ; Skin: Negative for pruritus, rash, abrasions, blisters, bruising and skin lesion.; ; Neuro: Negative for headache, lightheadedness and neck stiffness. Negative for weakness, altered level of consciousness, altered mental status, extremity weakness, paresthesias, involuntary movement, seizure and syncope.      Allergies  Erythromycin  Home Medications   Prior to Admission medications   Medication Sig Start Date End Date Taking? Authorizing Provider  atenolol (TENORMIN) 25 MG tablet Take 1 tablet (25 mg total) by mouth daily. 05/21/13  Yes Herminio Commons, MD  donepezil (ARICEPT) 5 MG tablet Take 5 mg by mouth daily.   Yes Historical Provider, MD  furosemide (LASIX) 20 MG tablet Take 1 tablet (20 mg total) by mouth daily.  01/02/16  Yes Herminio Commons, MD  lisinopril (PRINIVIL,ZESTRIL) 10 MG tablet Take 1 tablet (10 mg total) by mouth daily. 05/21/13  Yes Herminio Commons, MD  loratadine (CLARITIN) 10 MG tablet Take 10 mg by mouth daily.   Yes Historical Provider, MD  nitroGLYCERIN (NITROLINGUAL) 0.4 MG/SPRAY spray Place 1 spray under the tongue every 5 (five) minutes as needed for chest pain. 05/21/13  Yes Herminio Commons, MD  pantoprazole (PROTONIX) 40 MG tablet Take 40 mg by mouth every evening.    Yes Historical Provider, MD   simvastatin (ZOCOR) 20 MG tablet Take 1 tablet (20 mg total) by mouth at bedtime. 05/21/13  Yes Herminio Commons, MD  warfarin (COUMADIN) 2 MG tablet Take 2 tablets daily except 3 tablets on Wednesdays Patient taking differently: Take 4-6 mg by mouth every morning. Take 2 tablets daily except 3 tablets on Wednesdays 01/02/16  Yes Herminio Commons, MD   BP 146/60 mmHg  Pulse 56  Temp(Src) 98.1 F (36.7 C) (Oral)  Resp 18  Ht 5\' 9"  (1.753 m)  Wt 163 lb (73.936 kg)  BMI 24.06 kg/m2  SpO2 100% Physical Exam  1720: Physical examination:  Nursing notes reviewed; Vital signs and O2 SAT reviewed;  Constitutional: Well developed, Well nourished, Well hydrated, In no acute distress; Head:  Normocephalic, atraumatic; Eyes: EOMI, PERRL, No scleral icterus; ENMT: Mouth and pharynx normal, Mucous membranes moist; Neck: Supple, Full range of motion, No lymphadenopathy; Cardiovascular: Regular rate and rhythm, No gallop; Respiratory: Breath sounds clear & equal bilaterally, No wheezes.  Speaking full sentences with ease, Normal respiratory effort/excursion; Chest: Nontender, Movement normal; Abdomen: Soft, Nontender, Nondistended, Normal bowel sounds; Genitourinary: No CVA tenderness; Extremities: Pulses normal, No calf asymmetry. +2 pedal edema bilat. +TTP left calf and generalized ankle area. No specific area of point tenderness. NMS intact left foot, strong pedal pp, LE muscle compartments soft.  No left proximal fibular head tenderness, no knee tenderness, no foot tenderness.  No deformity, no ecchymosis, no erythema, no blisters, no open wounds.  +plantarflexion of left foot w/calf squeeze.  No palpable gap left Achilles's tendon. ; Neuro: AA&Ox3, Major CN grossly intact.  Speech clear. No gross focal motor deficits in extremities.; Skin: Color normal, Warm, Dry.   ED Course  Procedures (including critical care time) Labs Review  Imaging Review  I have personally reviewed and evaluated these  images and lab results as part of my medical decision-making.   EKG Interpretation None      MDM  MDM Reviewed: previous chart, nursing note and vitals Reviewed previous: labs Interpretation: labs, x-ray and ultrasound      Results for orders placed or performed during the hospital encounter of 03/29/16  Protime-INR  Result Value Ref Range   Prothrombin Time 30.2 (H) 11.6 - 15.2 seconds   INR 2.95 (H) 0.00 - 1.49   Dg Ankle Complete Left 03/29/2016  CLINICAL DATA:  Acute onset of left ankle and foot pain and swelling. Initial encounter. EXAM: LEFT ANKLE COMPLETE - 3+ VIEW COMPARISON:  Left tibia/fibula radiographs performed 06/21/2008 FINDINGS: There is no evidence of fracture or dislocation. The ankle mortise is intact; the interosseous space is within normal limits. No talar tilt or subluxation is seen. The joint spaces are preserved. Prominent soft tissue swelling is noted about the lower leg, with scattered postoperative change. Scattered vascular calcifications are seen. IMPRESSION: 1. No evidence of fracture or dislocation. 2. Diffuse soft tissue swelling about the lower leg. 3. Scattered vascular calcifications  seen. Electronically Signed   By: Garald Balding M.D.   On: 03/29/2016 19:25   US Venous Img Lower Unilateral Left 03/29/2016  CLINICAL DATA:  Left leg swelling and calf pain. History of pulmonary embolism. EXAM: LEFT LOWER EXTREMITY VENOUS DOPPLER ULTRASOUND TECHNIQUE: Gray-scale sonography with graded compression, as well as color Doppler and duplex ultrasound were performed to evaluate the lower extremity deep venous systems from the level of the common femoral vein and including the common femoral, femoral, profunda femoral, popliteal and calf veins including the posterior tibial, peroneal and gastrocnemius veins when visible. The superficial great saphenous vein was also interrogated. Spectral Doppler was utilized to evaluate flow at rest and with distal augmentation  maneuvers in the common femoral, femoral and popliteal veins. COMPARISON:  None. FINDINGS: Contralateral Common Femoral Vein: Respiratory phasicity is normal and symmetric with the symptomatic side. No evidence of thrombus. Normal compressibility. Common Femoral Vein: No evidence of thrombus. Normal compressibility, respiratory phasicity and response to augmentation. Saphenofemoral Junction: No evidence of thrombus. Normal compressibility and flow on color Doppler imaging. Profunda Femoral Vein: No evidence of thrombus. Normal compressibility and flow on color Doppler imaging. Femoral Vein: No evidence of thrombus. Normal compressibility, respiratory phasicity and response to augmentation. Popliteal Vein: No evidence of thrombus. Normal compressibility, respiratory phasicity and response to augmentation. Calf Veins: No evidence of thrombus. Normal compressibility and flow on color Doppler imaging. Superficial Great Saphenous Vein: Surgically removed in 1982 for a CABG. Venous Reflux:  None. Other Findings:  Rounded masses in the left groin. IMPRESSION: 1. No deep venous thrombosis seen. 2. Rounded masses in the left groin, most likely representing adenopathy. Electronically Signed   By: Claudie Revering M.D.   On: 03/29/2016 18:01   Dg Foot Complete Left 03/29/2016  CLINICAL DATA:  Acute onset of left ankle and foot pain and edema. Initial encounter. EXAM: LEFT FOOT - COMPLETE 3+ VIEW COMPARISON:  None. FINDINGS: There is no evidence of fracture or dislocation. The joint spaces are preserved. There is no evidence of talar subluxation; the subtalar joint is unremarkable in appearance. Diffuse soft tissue swelling is noted about the lower leg, with scattered postoperative change. Soft tissue swelling is also noted about the forefoot. Diffuse vascular calcifications are seen. IMPRESSION: 1. No evidence of fracture or dislocation. 2. Diffuse vascular calcifications seen. 3. Soft tissue swelling about the forefoot and  lower leg. Electronically Signed   By: Garald Balding M.D.   On: 03/29/2016 19:24    1940:  Workup reassuring. INR therapeutic. Pt states he is ready to go home now. Dx and testing d/w pt and family.  Questions answered.  Verb understanding, agreeable to d/c home with outpt f/u.   Francine Graven, DO 04/01/16 1411

## 2016-04-03 ENCOUNTER — Ambulatory Visit (INDEPENDENT_AMBULATORY_CARE_PROVIDER_SITE_OTHER): Payer: Medicare Other | Admitting: Physician Assistant

## 2016-04-03 ENCOUNTER — Encounter: Payer: Self-pay | Admitting: Orthopaedic Surgery

## 2016-04-03 ENCOUNTER — Encounter: Payer: Self-pay | Admitting: Physician Assistant

## 2016-04-03 ENCOUNTER — Encounter: Payer: Self-pay | Admitting: *Deleted

## 2016-04-03 ENCOUNTER — Ambulatory Visit (INDEPENDENT_AMBULATORY_CARE_PROVIDER_SITE_OTHER): Payer: Medicare Other | Admitting: Orthopaedic Surgery

## 2016-04-03 VITALS — BP 119/71 | HR 72 | Temp 99.5°F | Ht 69.0 in | Wt 163.0 lb

## 2016-04-03 VITALS — BP 120/70 | HR 74 | Ht 69.0 in | Wt 164.0 lb

## 2016-04-03 DIAGNOSIS — I2510 Atherosclerotic heart disease of native coronary artery without angina pectoris: Secondary | ICD-10-CM | POA: Diagnosis not present

## 2016-04-03 DIAGNOSIS — M25561 Pain in right knee: Secondary | ICD-10-CM

## 2016-04-03 DIAGNOSIS — J410 Simple chronic bronchitis: Secondary | ICD-10-CM

## 2016-04-03 DIAGNOSIS — I10 Essential (primary) hypertension: Secondary | ICD-10-CM

## 2016-04-03 DIAGNOSIS — I2699 Other pulmonary embolism without acute cor pulmonale: Secondary | ICD-10-CM

## 2016-04-03 DIAGNOSIS — L609 Nail disorder, unspecified: Secondary | ICD-10-CM | POA: Diagnosis not present

## 2016-04-03 DIAGNOSIS — R600 Localized edema: Secondary | ICD-10-CM | POA: Insufficient documentation

## 2016-04-03 DIAGNOSIS — L608 Other nail disorders: Secondary | ICD-10-CM

## 2016-04-03 NOTE — Progress Notes (Signed)
Patient [id number] Lance Grant, male DOB:[birth year], 82 y.o. [id number]  Chief Complaint  Patient presents with  . Follow-up    Right knee and nail trim    HPI  Lance Grant is a 82 y.o. male who has chronic right knee pain.  He has swelling and popping but no giving way and no locking.  He uses a cane to ambulate.  He has no new trauma, no redness.  He is active.  HPI  Body mass index is 24.06 kg/(m^2).  ROS  Review of Systems  Constitutional:       Patient does not have Diabetes Mellitus. Patient has hypertension. Patient has COPD or shortness of breath. Patient does not have BMI > 35. Patient does not have current smoking history.   Cardiovascular:       History of hypertension, Arteriosclerotic cardiovascular disease, peripheral vascular disease.  Neurological: Negative for numbness.    Past Medical History  Diagnosis Date  . Hypertension   . COPD (chronic obstructive pulmonary disease) ([place])   . Pleural effusion   . Chronic kidney disease, stage III (moderate)     Creatinine-1.9 08/2005, 1.5 02/2007, 1.7 12/2007, 1.7 06/2008  . Seminal vesiculitis   . GERD (gastroesophageal reflux disease)   . Hyperlipidemia   . Sinus bradycardia     When on beta blockers  . Arteriosclerotic cardiovascular disease (ASCVD)     CABG 10/2001 nl EF  . Peripheral vascular disease ([place])   . Pulmonary embolism [facility]) 2007    2007  . Chronic anticoagulation     Managed by [facility]  . Leg swelling     chronic    Past Surgical History  Procedure Laterality Date  . Coronary artery bypass graft  10/2001  . Transurethral resection of prostate  07/2005    History reviewed. No pertinent family history.  Social History Social History  Substance Use Topics  . Smoking status: Former Smoker -- 1.00 packs/day for 10 years    Start date: 11/27/1946    Quit date: 11/05/1987  . Smokeless tobacco: Never Used  . Alcohol Use: 4.2 oz/week    7 Cans of beer per week     Comment: 1 can  beer/day    Allergies  Allergen Reactions  . Erythromycin     Current Outpatient Prescriptions  Medication Sig Dispense Refill  . atenolol (TENORMIN) 25 MG tablet Take 1 tablet (25 mg total) by mouth daily. 90 tablet 1  . donepezil (ARICEPT) 5 MG tablet Take 5 mg by mouth daily.    . furosemide (LASIX) 20 MG tablet Take 1 tablet (20 mg total) by mouth daily. 90 tablet 3  . lisinopril (PRINIVIL,ZESTRIL) 10 MG tablet Take 1 tablet (10 mg total) by mouth daily. 90 tablet 1  . loratadine (CLARITIN) 10 MG tablet Take 10 mg by mouth daily.    . nitroGLYCERIN (NITROLINGUAL) 0.4 MG/SPRAY spray Place 1 spray under the tongue every 5 (five) minutes as needed for chest pain. 12 g 12  . pantoprazole (PROTONIX) 40 MG tablet Take 40 mg by mouth every evening.     . simvastatin (ZOCOR) 20 MG tablet Take 1 tablet (20 mg total) by mouth at bedtime. 90 tablet 1  . warfarin (COUMADIN) 2 MG tablet Take 2 tablets daily except 3 tablets on Wednesdays (Patient taking differently: Take 4-6 mg by mouth every morning. Take 2 tablets daily except 3 tablets on Wednesdays) 90 tablet 3   No current facility-administered medications for this visit.  Physical Exam  Blood pressure 119/71, pulse 72, temperature 99.5 F (37.5 C), height 5\' 9"  (1.753 m), weight 163 lb (73.936 kg).  Constitutional: overall normal hygiene, normal nutrition, well developed, normal grooming, normal body habitus. Assistive device:cane  Musculoskeletal: gait and station Limp right, muscle tone and strength are normal, no tremors or atrophy is present.  .  Neurological: coordination overall normal.  Deep tendon reflex/nerve stretch intact.  Sensation normal.  Cranial nerves II-XII intact.   Skin:   normal overall no scars, lesions, ulcers or rashes. No psoriasis.  Psychiatric: Alert and oriented x 3.  Recent memory intact, remote memory unclear.  Normal mood and affect. Well groomed.  Good eye contact.  Cardiovascular: overall no  swelling, no varicosities, no edema bilaterally, normal temperatures of the legs and arms, no clubbing, cyanosis and good capillary refill.  Lymphatic: palpation is normal.  The right lower extremity is examined:  Inspection:  Thigh:  Non-tender and no defects  Knee has swelling 1+ effusion.                        Joint tenderness is present                        Patient is tender over the medial joint line  Lower Leg:  Has normal appearance and no tenderness or defects  Ankle:  Non-tender and no defects  Foot:  Non-tender and no defects Range of Motion:  Knee:  Range of motion is: 0-100                        Crepitus is  present  Ankle:  Range of motion is normal. Strength and Tone:  The right lower extremity has normal strength and tone. Stability:  Knee:  The knee is stable.  Ankle:  The ankle is stable.  I pared his nails of his feet while he was here.  The patient has been educated about the nature of the problem(s) and counseled on treatment options.  The patient appeared to understand what I have discussed and is in agreement with it.  Encounter Diagnoses  Name Primary?  . Knee pain, right Yes  . Hyperkeratosis of nail   . Essential hypertension   . Simple chronic bronchitis ([place])   . Arteriosclerotic cardiovascular disease (ASCVD)     PLAN Call if any problems.  Precautions discussed.  Continue current medications.   Return to clinic 2 months

## 2016-04-03 NOTE — Patient Instructions (Signed)
Pt was seen in the office today by Ermalinda Barrios PA-C.  It was noted he was started on Cipro bid x 10 days yesterday for UTI.  Told caregiver to decrease coumadin dose to 2mg  alternating with 4mg  until finished with Cipro on 04/11/16 the resume regular dose of 4mg  daily except 6mg  on Wednesdays.  Has appt for INR check on 04/15/16.  CG verbalized understanding.

## 2016-04-03 NOTE — Patient Instructions (Signed)
Your physician recommends that you schedule a follow-up appointment with Dr. Bronson Ing as scheduled.  Your physician recommends that you continue on your current medications as directed. Please refer to the Current Medication list given to you today.  If you need a refill on your cardiac medications before your next appointment, please call your pharmacy.  Thank you for choosing [facility]!  Low-Sodium Eating Plan Sodium raises blood pressure and causes water to be held in the body. Getting less sodium from food will help lower your blood pressure, reduce any swelling, and protect your heart, liver, and kidneys. We get sodium by adding salt (sodium chloride) to food. Most of our sodium comes from canned, boxed, and frozen foods. Restaurant foods, fast foods, and pizza are also very high in sodium. Even if you take medicine to lower your blood pressure or to reduce fluid in your body, getting less sodium from your food is important. WHAT IS MY PLAN? Most people should limit their sodium intake to 2,300 mg a day. Your health care provider recommends that you limit your sodium intake to _____2GM_____ a day.  WHAT DO I NEED TO KNOW ABOUT THIS EATING PLAN? For the low-sodium eating plan, you will follow these general guidelines:  Choose foods with a % Daily Value for sodium of less than 5% (as listed on the food label).   Use salt-free seasonings or herbs instead of table salt or sea salt.   Check with your health care provider or pharmacist before using salt substitutes.   Eat fresh foods.  Eat more vegetables and fruits.  Limit canned vegetables. If you do use them, rinse them well to decrease the sodium.   Limit cheese to 1 oz (28 g) per day.   Eat lower-sodium products, often labeled as "lower sodium" or "no salt added."  Avoid foods that contain monosodium glutamate (MSG). MSG is sometimes added to [place] food and some canned foods.  Check food labels (Nutrition  Facts labels) on foods to learn how much sodium is in one serving.  Eat more home-cooked food and less restaurant, buffet, and fast food.  When eating at a restaurant, ask that your food be prepared with less salt, or no salt if possible.  HOW DO I READ FOOD LABELS FOR SODIUM INFORMATION? The Nutrition Facts label lists the amount of sodium in one serving of the food. If you eat more than one serving, you must multiply the listed amount of sodium by the number of servings. Food labels may also identify foods as:  Sodium free--Less than 5 mg in a serving.  Very low sodium--35 mg or less in a serving.  Low sodium--140 mg or less in a serving.  Light in sodium--50% less sodium in a serving. For example, if a food that usually has 300 mg of sodium is changed to become light in sodium, it will have 150 mg of sodium.  Reduced sodium--25% less sodium in a serving. For example, if a food that usually has 400 mg of sodium is changed to reduced sodium, it will have 300 mg of sodium. WHAT FOODS CAN I EAT? Grains Low-sodium cereals, including oats, puffed wheat and rice, and shredded wheat cereals. Low-sodium crackers. Unsalted rice and pasta. Lower-sodium bread.  Vegetables Frozen or fresh vegetables. Low-sodium or reduced-sodium canned vegetables. Low-sodium or reduced-sodium tomato sauce and paste. Low-sodium or reduced-sodium tomato and vegetable juices.  Fruits Fresh, frozen, and canned fruit. Fruit juice.  Meat and Other Protein Products Low-sodium canned tuna and  salmon. Fresh or frozen meat, poultry, seafood, and fish. Lamb. Unsalted nuts. Dried beans, peas, and lentils without added salt. Unsalted canned beans. Homemade soups without salt. Eggs.  Dairy Milk. Soy milk. Ricotta cheese. Low-sodium or reduced-sodium cheeses. Yogurt.  Condiments Fresh and dried herbs and spices. Salt-free seasonings. Onion and garlic powders. Low-sodium varieties of mustard and ketchup. Fresh or  refrigerated horseradish. Lemon juice.  Fats and Oils Reduced-sodium salad dressings. Unsalted butter.  Other Unsalted popcorn and pretzels.  The items listed above may not be a complete list of recommended foods or beverages. Contact your dietitian for more options. WHAT FOODS ARE NOT RECOMMENDED? Grains Instant hot cereals. Bread stuffing, pancake, and biscuit mixes. Croutons. Seasoned rice or pasta mixes. Noodle soup cups. Boxed or frozen macaroni and cheese. Self-rising flour. Regular salted crackers. Vegetables Regular canned vegetables. Regular canned tomato sauce and paste. Regular tomato and vegetable juices. Frozen vegetables in sauces. Salted [place] fries. Olives. Angie Fava. Relishes. Sauerkraut. Salsa. Meat and Other Protein Products Salted, canned, smoked, spiced, or pickled meats, seafood, or fish. Bacon, ham, sausage, hot dogs, corned beef, chipped beef, and packaged luncheon meats. Salt pork. Jerky. Pickled herring. Anchovies, regular canned tuna, and sardines. Salted nuts. Dairy Processed cheese and cheese spreads. Cheese curds. Blue cheese and cottage cheese. Buttermilk.  Condiments Onion and garlic salt, seasoned salt, table salt, and sea salt. Canned and packaged gravies. Worcestershire sauce. Tartar sauce. Barbecue sauce. Teriyaki sauce. Soy sauce, including reduced sodium. Steak sauce. Fish sauce. Oyster sauce. Cocktail sauce. Horseradish that you find on the shelf. Regular ketchup and mustard. Meat flavorings and tenderizers. Bouillon cubes. Hot sauce. Tabasco sauce. Marinades. Taco seasonings. Relishes. Fats and Oils Regular salad dressings. Salted butter. Margarine. Ghee. Bacon fat.  Other Potato and tortilla chips. Corn chips and puffs. Salted popcorn and pretzels. Canned or dried soups. Pizza. Frozen entrees and pot pies.  The items listed above may not be a complete list of foods and beverages to avoid. Contact your dietitian for more information.   This  information is not intended to replace advice given to you by your health care provider. Make sure you discuss any questions you have with your health care provider.   Document Released: 04/12/2002 Document Revised: 11/11/2014 Document Reviewed: 08/25/2013 Elsevier Interactive Patient Education [employer].

## 2016-04-03 NOTE — Progress Notes (Signed)
Cardiology Office Note    Date:  04/03/2016   ID:  Lance Grant, DOB July 16, 1921, MRN [id number]  PCP:  Maggie Font, MD  Cardiologist:  Dr. Bronson Ing Chief complaint post hospital follow-up  History of Present Illness:  Lance Grant is a 82 y.o. male patient with history of pulmonary embolism 2007 on chronic Coumadin, CAD status post CABG 2002, essential hypertension, HLD, CK D stage III, and GERD. He has chronic lower extremity edema which he takes Lasix for and compression stockings were ordered.  Patient was recently in the emergency room with left lower leg swelling and pain for 2 days after using compression hose. X-rays revealed no fracture of the left foot or ankle. Her extremity Dopplers no evidence of DVT. He did have a rounded mass in the left groin most likely representing adenopathy. INR was therapeutic. Patient says he couldn't move his foot from the compression hose and they were too tight. His swelling is down when he gets up in the morning but gets worse as the day goes on. His breathing is stable. He actually tripped and fell on the grass on his way in here today but he had no injuries from this. He walks with a cane and is here with his adopted granddaughter. He is also on Cipro for a UTI. Patient eats out quite often and probably gets more salt than he should. No recent evaluation of his LV function.    Past Medical History  Diagnosis Date  . Hypertension   . COPD (chronic obstructive pulmonary disease) ([place])   . Pleural effusion   . Chronic kidney disease, stage III (moderate)     Creatinine-1.9 08/2005, 1.5 02/2007, 1.7 12/2007, 1.7 06/2008  . Seminal vesiculitis   . GERD (gastroesophageal reflux disease)   . Hyperlipidemia   . Sinus bradycardia     When on beta blockers  . Arteriosclerotic cardiovascular disease (ASCVD)     CABG 10/2001 nl EF  . Peripheral vascular disease ([place])   . Pulmonary embolism [facility]) 2007    2007  . Chronic anticoagulation    Managed by   . Leg swelling     chronic    Past Surgical History  Procedure Laterality Date  . Coronary artery bypass graft  10/2001  . Transurethral resection of prostate  07/2005    Current Medications: Outpatient Prescriptions Prior to Visit  Medication Sig Dispense Refill  . atenolol (TENORMIN) 25 MG tablet Take 1 tablet (25 mg total) by mouth daily. 90 tablet 1  . donepezil (ARICEPT) 5 MG tablet Take 5 mg by mouth daily.    . furosemide (LASIX) 20 MG tablet Take 1 tablet (20 mg total) by mouth daily. 90 tablet 3  . lisinopril (PRINIVIL,ZESTRIL) 10 MG tablet Take 1 tablet (10 mg total) by mouth daily. 90 tablet 1  . loratadine (CLARITIN) 10 MG tablet Take 10 mg by mouth daily.    . nitroGLYCERIN (NITROLINGUAL) 0.4 MG/SPRAY spray Place 1 spray under the tongue every 5 (five) minutes as needed for chest pain. 12 g 12  . pantoprazole (PROTONIX) 40 MG tablet Take 40 mg by mouth every evening.     . simvastatin (ZOCOR) 20 MG tablet Take 1 tablet (20 mg total) by mouth at bedtime. 90 tablet 1  . warfarin (COUMADIN) 2 MG tablet Take 2 tablets daily except 3 tablets on Wednesdays (Patient taking differently: Take 4-6 mg by mouth every morning. Take 2 tablets daily except 3 tablets on Wednesdays) 90 tablet  3   No facility-administered medications prior to visit.     Allergies:   Erythromycin   Social History   Social History  . Marital Status: Married    Spouse Name: N/A  . Number of Children: 2  . Years of Education: N/A   Occupational History  . Retired     [employer]  . [employer] for a total of 17 years; recently declined renominationb   Social History Main Topics  . Smoking status: Former Smoker -- 1.00 packs/day for 10 years    Start date: 11/27/1946    Quit date: 11/05/1987  . Smokeless tobacco: Never Used  . Alcohol Use: 4.2 oz/week    7 Cans of beer per week     Comment: 1 can beer/day  . Drug Use: No  .  Sexual Activity: Not Asked   Other Topics Concern  . None   Social History Narrative   Married   2 children, 2 grandchildren   No regular exercise       ROS:   Please see the history of present illness.    Review of Systems  Constitution: Positive for weakness and malaise/fatigue.  HENT: Negative.   Eyes: Positive for vision loss in left eye and vision loss in right eye.  Cardiovascular: Positive for leg swelling.  Respiratory: Negative.   Endocrine: Negative.   Hematologic/Lymphatic: Negative.   Musculoskeletal: Negative.   Gastrointestinal: Negative.   Genitourinary: Negative.   Neurological: Positive for excessive daytime sleepiness and loss of balance.   All other systems reviewed and are negative.   PHYSICAL EXAM:   VS:  BP 120/70 mmHg  Pulse 74  Ht 5\' 9"  (1.753 m)  Wt 164 lb (74.39 kg)  BMI 24.21 kg/m2  SpO2 96%   GEN: Well nourished, well developed, in no acute distress HEENT: normal Neck: no JVD, carotid bruits, or masses Cardiac:  RRR; no murmurs, rubs, or gallops, +2 edema halfway up his tibia bilaterally  Respiratory:  clear to auscultation bilaterally, normal work of breathing GI: soft, nontender, nondistended, + BS MS: no deformity or atrophy Skin: warm and dry, no rash Neuro:  Alert and Oriented x 3, Strength and sensation are intact Psych: euthymic mood, full affect  Wt Readings from Last 3 Encounters:  04/03/16 164 lb (74.39 kg)  04/03/16 163 lb (73.936 kg)  03/29/16 163 lb (73.936 kg)      Studies/Labs Reviewed:   EKG:  EKG is Not ordered today.   Recent Labs: No results found for requested labs within last 365 days.   Lipid Panel    Component Value Date/Time   CHOL 162 09/15/2012 1020   TRIG 145 09/15/2012 1020   HDL 39* 09/15/2012 1020   CHOLHDL 4.2 09/15/2012 1020   VLDL 29 09/15/2012 1020   LDLCALC 94 09/15/2012 1020    Additional studies/ records that were reviewed today include:   IMPRESSION: 1. No deep venous  thrombosis seen. 2. Rounded masses in the left groin, most likely representing adenopathy.     Electronically Signed   By: Claudie Revering M.D.   On: 03/29/2016 18:01      ASSESSMENT:    1. Bilateral edema of lower extremity   2. Pulmonary embolism, other ([place])   3. Essential hypertension   4. Arteriosclerotic cardiovascular disease (ASCVD)      PLAN:  In order of problems listed above: Bilateral leg edema of the lower extremities: patient ended  up in the emergency room because he had leg pain when he wore the compression stockings. He had trouble getting them on and off. Recommend 2 g sodium diet, current Lasix dose of 20 mg daily. Keep legs elevated during the day as much as he can. Consider 2-D echo in the future to evaluate LV function.  History of pulmonary embolus on chronic Coumadin patient did have a slight fall when he was coming to the office today. He has no bruising. He was started on Cipro yesterday for a UTI. Christella Noa adjusted his Coumadin dose while on Cipro.  Hypertension well controlled on Tenormin  CAD with prior CABG no complaints of chest pain    Medication Adjustments/Labs and Tests Ordered: Current medicines are reviewed at length with the patient today.  Concerns regarding medicines are outlined above.  Medication changes, Labs and Tests ordered today are listed in the Patient Instructions below. Patient Instructions  Your physician recommends that you schedule a follow-up appointment with Dr. Bronson Ing as scheduled.  Your physician recommends that you continue on your current medications as directed. Please refer to the Current Medication list given to you today.  If you need a refill on your cardiac medications before your next appointment, please call your pharmacy.  Thank you for choosing [facility]!  Low-Sodium Eating Plan Sodium raises blood pressure and causes water to be held in the body. Getting less sodium from food will help  lower your blood pressure, reduce any swelling, and protect your heart, liver, and kidneys. We get sodium by adding salt (sodium chloride) to food. Most of our sodium comes from canned, boxed, and frozen foods. Restaurant foods, fast foods, and pizza are also very high in sodium. Even if you take medicine to lower your blood pressure or to reduce fluid in your body, getting less sodium from your food is important. WHAT IS MY PLAN? Most people should limit their sodium intake to 2,300 mg a day. Your health care provider recommends that you limit your sodium intake to _____2GM_____ a day.  WHAT DO I NEED TO KNOW ABOUT THIS EATING PLAN? For the low-sodium eating plan, you will follow these general guidelines:  Choose foods with a % Daily Value for sodium of less than 5% (as listed on the food label).   Use salt-free seasonings or herbs instead of table salt or sea salt.   Check with your health care provider or pharmacist before using salt substitutes.   Eat fresh foods.  Eat more vegetables and fruits.  Limit canned vegetables. If you do use them, rinse them well to decrease the sodium.   Limit cheese to 1 oz (28 g) per day.   Eat lower-sodium products, often labeled as "lower sodium" or "no salt added."  Avoid foods that contain monosodium glutamate (MSG). MSG is sometimes added to [place] food and some canned foods.  Check food labels (Nutrition Facts labels) on foods to learn how much sodium is in one serving.  Eat more home-cooked food and less restaurant, buffet, and fast food.  When eating at a restaurant, ask that your food be prepared with less salt, or no salt if possible.  HOW DO I READ FOOD LABELS FOR SODIUM INFORMATION? The Nutrition Facts label lists the amount of sodium in one serving of the food. If you eat more than one serving, you must multiply the listed amount of sodium by the number of servings. Food labels may also identify foods as:  Sodium free--Less  than  5 mg in a serving.  Very low sodium--35 mg or less in a serving.  Low sodium--140 mg or less in a serving.  Light in sodium--50% less sodium in a serving. For example, if a food that usually has 300 mg of sodium is changed to become light in sodium, it will have 150 mg of sodium.  Reduced sodium--25% less sodium in a serving. For example, if a food that usually has 400 mg of sodium is changed to reduced sodium, it will have 300 mg of sodium. WHAT FOODS CAN I EAT? Grains Low-sodium cereals, including oats, puffed wheat and rice, and shredded wheat cereals. Low-sodium crackers. Unsalted rice and pasta. Lower-sodium bread.  Vegetables Frozen or fresh vegetables. Low-sodium or reduced-sodium canned vegetables. Low-sodium or reduced-sodium tomato sauce and paste. Low-sodium or reduced-sodium tomato and vegetable juices.  Fruits Fresh, frozen, and canned fruit. Fruit juice.  Meat and Other Protein Products Low-sodium canned tuna and salmon. Fresh or frozen meat, poultry, seafood, and fish. Lamb. Unsalted nuts. Dried beans, peas, and lentils without added salt. Unsalted canned beans. Homemade soups without salt. Eggs.  Dairy Milk. Soy milk. Ricotta cheese. Low-sodium or reduced-sodium cheeses. Yogurt.  Condiments Fresh and dried herbs and spices. Salt-free seasonings. Onion and garlic powders. Low-sodium varieties of mustard and ketchup. Fresh or refrigerated horseradish. Lemon juice.  Fats and Oils Reduced-sodium salad dressings. Unsalted butter.  Other Unsalted popcorn and pretzels.  The items listed above may not be a complete list of recommended foods or beverages. Contact your dietitian for more options. WHAT FOODS ARE NOT RECOMMENDED? Grains Instant hot cereals. Bread stuffing, pancake, and biscuit mixes. Croutons. Seasoned rice or pasta mixes. Noodle soup cups. Boxed or frozen macaroni and cheese. Self-rising flour. Regular salted crackers. Vegetables Regular canned  vegetables. Regular canned tomato sauce and paste. Regular tomato and vegetable juices. Frozen vegetables in sauces. Salted [place] fries. Olives. Angie Fava. Relishes. Sauerkraut. Salsa. Meat and Other Protein Products Salted, canned, smoked, spiced, or pickled meats, seafood, or fish. Bacon, ham, sausage, hot dogs, corned beef, chipped beef, and packaged luncheon meats. Salt pork. Jerky. Pickled herring. Anchovies, regular canned tuna, and sardines. Salted nuts. Dairy Processed cheese and cheese spreads. Cheese curds. Blue cheese and cottage cheese. Buttermilk.  Condiments Onion and garlic salt, seasoned salt, table salt, and sea salt. Canned and packaged gravies. Worcestershire sauce. Tartar sauce. Barbecue sauce. Teriyaki sauce. Soy sauce, including reduced sodium. Steak sauce. Fish sauce. Oyster sauce. Cocktail sauce. Horseradish that you find on the shelf. Regular ketchup and mustard. Meat flavorings and tenderizers. Bouillon cubes. Hot sauce. Tabasco sauce. Marinades. Taco seasonings. Relishes. Fats and Oils Regular salad dressings. Salted butter. Margarine. Ghee. Bacon fat.  Other Potato and tortilla chips. Corn chips and puffs. Salted popcorn and pretzels. Canned or dried soups. Pizza. Frozen entrees and pot pies.  The items listed above may not be a complete list of foods and beverages to avoid. Contact your dietitian for more information.   This information is not intended to replace advice given to you by your health care provider. Make sure you discuss any questions you have with your health care provider.   Document Released: 04/12/2002 Document Revised: 11/11/2014 Document Reviewed: 08/25/2013 Elsevier Interactive Patient Education 2016 [place], Ermalinda Barrios, [place]  04/03/2016 1:54 PM    [facility] [address], [place],   [address] Phone: [phone]; Fax: [phone]

## 2016-04-17 ENCOUNTER — Ambulatory Visit (INDEPENDENT_AMBULATORY_CARE_PROVIDER_SITE_OTHER): Payer: Medicare Other | Admitting: *Deleted

## 2016-04-17 DIAGNOSIS — Z5181 Encounter for therapeutic drug level monitoring: Secondary | ICD-10-CM | POA: Diagnosis not present

## 2016-04-17 DIAGNOSIS — I2699 Other pulmonary embolism without acute cor pulmonale: Secondary | ICD-10-CM

## 2016-04-17 DIAGNOSIS — R319 Hematuria, unspecified: Secondary | ICD-10-CM | POA: Diagnosis not present

## 2016-04-17 LAB — POCT INR: INR: 1.6

## 2016-04-26 ENCOUNTER — Encounter: Payer: Self-pay | Admitting: Adult Health

## 2016-04-26 ENCOUNTER — Ambulatory Visit (INDEPENDENT_AMBULATORY_CARE_PROVIDER_SITE_OTHER): Payer: Medicare Other | Admitting: Adult Health

## 2016-04-26 VITALS — BP 128/78 | HR 69 | Ht 69.0 in | Wt 160.0 lb

## 2016-04-26 DIAGNOSIS — E785 Hyperlipidemia, unspecified: Secondary | ICD-10-CM | POA: Diagnosis not present

## 2016-04-26 DIAGNOSIS — R531 Weakness: Secondary | ICD-10-CM | POA: Diagnosis not present

## 2016-04-26 DIAGNOSIS — I2510 Atherosclerotic heart disease of native coronary artery without angina pectoris: Secondary | ICD-10-CM

## 2016-04-26 DIAGNOSIS — Z79899 Other long term (current) drug therapy: Secondary | ICD-10-CM

## 2016-04-26 NOTE — Patient Instructions (Addendum)
Your physician wants you to follow-up with Dr. Bronson Ing. You will receive a reminder letter in the mail two months in advance. If you don't receive a letter, please call our office to schedule the follow-up appointment.  Your physician recommends that you continue on your current medications as directed. Please refer to the Current Medication list given to you today.  Your physician recommends that you return for lab work in: Fasting  If you need a refill on your cardiac medications before your next appointment, please call your pharmacy.  Thank you for choosing [facility]!

## 2016-04-26 NOTE — Progress Notes (Signed)
Name: Lance Grant    DOB: [birth year]  Age: 82 y.o.  MR#: [id number]       PCP:  Maggie Font, MD      Insurance: Payor: MEDICARE / Plan: MEDICARE PART A AND B / Product Type: *No Product type* /   CC:   No chief complaint on file.   VS Filed Vitals:   04/26/16 1504  BP: 128/78  Pulse: 69  Height: 5' 9" (1.753 m)  Weight: 160 lb (72.576 kg)  SpO2: 94%    Weights Current Weight  04/26/16 160 lb (72.576 kg)  04/03/16 164 lb (74.39 kg)  04/03/16 163 lb (73.936 kg)    Blood Pressure  BP Readings from Last 3 Encounters:  04/26/16 128/78  04/03/16 120/70  04/03/16 119/71     Admit date:  (Not on file) Last encounter with RMR:  Visit date not found   Allergy Erythromycin  Current Outpatient Prescriptions  Medication Sig Dispense Refill  . atenolol (TENORMIN) 25 MG tablet Take 1 tablet (25 mg total) by mouth daily. 90 tablet 1  . donepezil (ARICEPT) 5 MG tablet Take 5 mg by mouth daily.    . furosemide (LASIX) 20 MG tablet Take 1 tablet (20 mg total) by mouth daily. 90 tablet 3  . lisinopril (PRINIVIL,ZESTRIL) 10 MG tablet Take 1 tablet (10 mg total) by mouth daily. 90 tablet 1  . loratadine (CLARITIN) 10 MG tablet Take 10 mg by mouth daily.    . nitroGLYCERIN (NITROLINGUAL) 0.4 MG/SPRAY spray Place 1 spray under the tongue every 5 (five) minutes as needed for chest pain. 12 g 12  . pantoprazole (PROTONIX) 40 MG tablet Take 40 mg by mouth every evening.     . simvastatin (ZOCOR) 20 MG tablet Take 1 tablet (20 mg total) by mouth at bedtime. 90 tablet 1  . warfarin (COUMADIN) 2 MG tablet Take 2 tablets daily except 3 tablets on Wednesdays (Patient taking differently: Take 4-6 mg by mouth every morning. Take 2 tablets daily except 3 tablets on Wednesdays) 90 tablet 3   No current facility-administered medications for this visit.    Discontinued Meds:    Medications Discontinued During This Encounter  Medication Reason  . ciprofloxacin (CIPRO) 250 MG/5ML (5%) SUSR Error     Patient Active Problem List   Diagnosis Date Noted  . Lower extremity edema 04/03/2016  . Knee pain, right 12/27/2015  . Hyperkeratosis of nail 12/27/2015  . Encounter for therapeutic drug monitoring 12/01/2013  . Hypertension   . COPD (chronic obstructive pulmonary disease) ([place])   . Chronic kidney disease, stage III (moderate)   . GERD (gastroesophageal reflux disease)   . Hyperlipidemia   . Arteriosclerotic cardiovascular disease (ASCVD)   . Pulmonary embolism ([facility])   . Chronic anticoagulation     LABS    Component Value Date/Time   NA 142 03/10/2015 1558   NA 139 01/24/2015 1600   NA 140 01/02/2015 1555   K 5.2 03/10/2015 1558   K 4.9 01/24/2015 1600   K 4.8 01/02/2015 1555   CL 107 03/10/2015 1558   CL 108 01/24/2015 1600   CL 105 01/02/2015 1555   CO2 29 03/10/2015 1558   CO2 29 01/24/2015 1600   CO2 26 01/02/2015 1555   GLUCOSE 72 03/10/2015 1558   GLUCOSE 81 01/24/2015 1600   GLUCOSE 63* 01/02/2015 1555   BUN 27* 03/10/2015 1558   BUN 27* 01/24/2015 1600   BUN 28* 01/02/2015 1555   CREATININE 1.75*  03/10/2015 1558   CREATININE 1.85* 01/24/2015 1600   CREATININE 1.79* 01/02/2015 1555   CREATININE 1.48* 04/10/2013 1145   CREATININE 1.49 02/22/2011 1400   CALCIUM 9.2 03/10/2015 1558   CALCIUM 8.9 01/24/2015 1600   CALCIUM 9.0 01/02/2015 1555   GFRNONAA 40* 04/10/2013 1145   GFRNONAA 45* 02/22/2011 1400   GFRAA 46* 04/10/2013 1145   GFRAA * 02/22/2011 1400    54        The eGFR has been calculated using the MDRD equation. This calculation has not been validated in all clinical situations. eGFR's persistently <60 mL/min signify possible Chronic Kidney Disease.   CMP     Component Value Date/Time   NA 142 03/10/2015 1558   K 5.2 03/10/2015 1558   CL 107 03/10/2015 1558   CO2 29 03/10/2015 1558   GLUCOSE 72 03/10/2015 1558   BUN 27* 03/10/2015 1558   CREATININE 1.75* 03/10/2015 1558   CREATININE 1.48* 04/10/2013 1145   CALCIUM 9.2  03/10/2015 1558   PROT 6.2 09/15/2012 1020   ALBUMIN 4.0 09/15/2012 1020   AST 21 09/15/2012 1020   ALT 15 09/15/2012 1020   ALKPHOS 52 09/15/2012 1020   BILITOT 0.6 09/15/2012 1020   GFRNONAA 40* 04/10/2013 1145   GFRAA 46* 04/10/2013 1145       Component Value Date/Time   WBC 4.7 04/10/2013 1145   WBC 6.6 09/17/2012 1222   HGB 12.8* 04/10/2013 1145   HGB 12.7* 09/17/2012 1222   HGB 12.1* 02/22/2011 1400   HCT 36.0* 04/10/2013 1145   HCT 37.1* 09/17/2012 1222   HCT 35.8* 02/22/2011 1400   MCV 84.5 04/10/2013 1145   MCV 81.9 09/17/2012 1222    Lipid Panel     Component Value Date/Time   CHOL 162 09/15/2012 1020   TRIG 145 09/15/2012 1020   HDL 39* 09/15/2012 1020   CHOLHDL 4.2 09/15/2012 1020   VLDL 29 09/15/2012 1020   LDLCALC 94 09/15/2012 1020    ABG No results found for: PHART, PCO2ART, PO2ART, HCO3, TCO2, ACIDBASEDEF, O2SAT   No results found for: TSH BNP (last 3 results) No results for input(s): BNP in the last 8760 hours.  ProBNP (last 3 results) No results for input(s): PROBNP in the last 8760 hours.  Cardiac Panel (last 3 results) No results for input(s): CKTOTAL, CKMB, TROPONINI, RELINDX in the last 72 hours.  Iron/TIBC/Ferritin/ %Sat No results found for: IRON, TIBC, FERRITIN, IRONPCTSAT   EKG Orders placed or performed in visit on 03/08/16  . EKG 12-Lead     Prior Assessment and Plan Problem List as of 04/26/2016      Cardiovascular and Mediastinum   Hypertension   Last Assessment & Plan 02/17/2014 Office Visit Written 02/17/2014  3:40 PM by Lendon Colonel, NP    Excellent control of blood pressure. No changes at this time. Followup labs are reordered.      Arteriosclerotic cardiovascular disease (ASCVD)   Last Assessment & Plan 02/17/2014 Office Visit Written 02/17/2014  3:40 PM by Lendon Colonel, NP    Lower extremity edema is essentially eliminated, his weight is down 10 pounds, he is breathing and feeling better. HCTZ has been  discontinued currently. I have ordered a refill on this to use when necessary fluid retention. He is adhering to a low sodium diet as his children are his main caregivers are very stringent concerning this. He denies chest pain or dyspnea on exertion, and feels as if he has more energy. He is due  for a BMET on April 27, we will review.      Pulmonary embolism [facility])   Last Assessment & Plan 02/17/2014 Office Visit Written 02/17/2014  3:41 PM by Lendon Colonel, NP    He is seen in our Coumadin clinic today I Ms. Joneen Caraway, Therapist, sports. Coumadin dose is adjusted based upon INR.        Respiratory   COPD (chronic obstructive pulmonary disease) (HCC)     Digestive   GERD (gastroesophageal reflux disease)     Musculoskeletal and Integument   Hyperkeratosis of nail     Genitourinary   Chronic kidney disease, stage III (moderate)   Last Assessment & Plan 09/08/2012 Office Visit Written 09/08/2012  1:46 PM by Yehuda Savannah, MD    No laboratory studies are available for my review since late 2009.  We will seek more recent studies from patient's PCP.        Other   Hyperlipidemia   Last Assessment & Plan 09/08/2012 Office Visit Written 09/08/2012  1:47 PM by Yehuda Savannah, MD    No recent lipid profile available.  We will either obtain results from Dr. Berdine Addison or send patient for a lipid profile.      Chronic anticoagulation   Encounter for therapeutic drug monitoring   Knee pain, right   Lower extremity edema       Imaging: Dg Ankle Complete Left  03/29/2016  CLINICAL DATA:  Acute onset of left ankle and foot pain and swelling. Initial encounter. EXAM: LEFT ANKLE COMPLETE - 3+ VIEW COMPARISON:  Left tibia/fibula radiographs performed 06/21/2008 FINDINGS: There is no evidence of fracture or dislocation. The ankle mortise is intact; the interosseous space is within normal limits. No talar tilt or subluxation is seen. The joint spaces are preserved. Prominent soft tissue swelling is noted about the  lower leg, with scattered postoperative change. Scattered vascular calcifications are seen. IMPRESSION: 1. No evidence of fracture or dislocation. 2. Diffuse soft tissue swelling about the lower leg. 3. Scattered vascular calcifications seen. Electronically Signed   By: Garald Balding M.D.   On: 03/29/2016 19:25   US Venous Img Lower Unilateral Left  03/29/2016  CLINICAL DATA:  Left leg swelling and calf pain. History of pulmonary embolism. EXAM: LEFT LOWER EXTREMITY VENOUS DOPPLER ULTRASOUND TECHNIQUE: Gray-scale sonography with graded compression, as well as color Doppler and duplex ultrasound were performed to evaluate the lower extremity deep venous systems from the level of the common femoral vein and including the common femoral, femoral, profunda femoral, popliteal and calf veins including the posterior tibial, peroneal and gastrocnemius veins when visible. The superficial great saphenous vein was also interrogated. Spectral Doppler was utilized to evaluate flow at rest and with distal augmentation maneuvers in the common femoral, femoral and popliteal veins. COMPARISON:  None. FINDINGS: Contralateral Common Femoral Vein: Respiratory phasicity is normal and symmetric with the symptomatic side. No evidence of thrombus. Normal compressibility. Common Femoral Vein: No evidence of thrombus. Normal compressibility, respiratory phasicity and response to augmentation. Saphenofemoral Junction: No evidence of thrombus. Normal compressibility and flow on color Doppler imaging. Profunda Femoral Vein: No evidence of thrombus. Normal compressibility and flow on color Doppler imaging. Femoral Vein: No evidence of thrombus. Normal compressibility, respiratory phasicity and response to augmentation. Popliteal Vein: No evidence of thrombus. Normal compressibility, respiratory phasicity and response to augmentation. Calf Veins: No evidence of thrombus. Normal compressibility and flow on color Doppler imaging. Superficial  Great Saphenous Vein: Surgically removed in 1982 for a CABG.  Venous Reflux:  None. Other Findings:  Rounded masses in the left groin. IMPRESSION: 1. No deep venous thrombosis seen. 2. Rounded masses in the left groin, most likely representing adenopathy. Electronically Signed   By: Claudie Revering M.D.   On: 03/29/2016 18:01   Dg Foot Complete Left  03/29/2016  CLINICAL DATA:  Acute onset of left ankle and foot pain and edema. Initial encounter. EXAM: LEFT FOOT - COMPLETE 3+ VIEW COMPARISON:  None. FINDINGS: There is no evidence of fracture or dislocation. The joint spaces are preserved. There is no evidence of talar subluxation; the subtalar joint is unremarkable in appearance. Diffuse soft tissue swelling is noted about the lower leg, with scattered postoperative change. Soft tissue swelling is also noted about the forefoot. Diffuse vascular calcifications are seen. IMPRESSION: 1. No evidence of fracture or dislocation. 2. Diffuse vascular calcifications seen. 3. Soft tissue swelling about the forefoot and lower leg. Electronically Signed   By: Garald Balding M.D.   On: 03/29/2016 19:24

## 2016-04-26 NOTE — Progress Notes (Signed)
. Cardiology Office Note   Date:  04/26/2016   ID:  Lance Grant, DOB 11/18/1920, MRN [id number]  PCP:  Maggie Font, MD  Cardiologist: Woodroe Chen, NP   No chief complaint on file.     History of Present Illness: Lance Grant is a 82 y.o. male who presents for ongoing assessment and management of CAD,. S/P CABG 2002, hypertension, Hyperlipidemia, with hx of PE on chronic coumadin therapy.He has chronic LEE and is supposed to have TED hose but was unable to tolerate them due to tightness and was seen in ER for this. He was placed on a low sodium diet and continued on current dose of lasix. Marland Kitchen   He is here today brought by his son. He states that he had a UTI about a week ago and was treated with antibiotics. He had hematuria and therefore coumadin was stopped. He was just restarted with last INR 1.6. He has been complaining of dark colored stool and dark colored urine for the last week. He has not seen frank bleeding or had abdominal pain. No melena or epistaxis.   Past Medical History  Diagnosis Date  . Hypertension   . COPD (chronic obstructive pulmonary disease) ([place])   . Pleural effusion   . Chronic kidney disease, stage III (moderate)     Creatinine-1.9 08/2005, 1.5 02/2007, 1.7 12/2007, 1.7 06/2008  . Seminal vesiculitis   . GERD (gastroesophageal reflux disease)   . Hyperlipidemia   . Sinus bradycardia     When on beta blockers  . Arteriosclerotic cardiovascular disease (ASCVD)     CABG 10/2001 nl EF  . Peripheral vascular disease ([place])   . Pulmonary embolism [facility]) 2007    2007  . Chronic anticoagulation     Managed by [place]  . Leg swelling     chronic    Past Surgical History  Procedure Laterality Date  . Coronary artery bypass graft  10/2001  . Transurethral resection of prostate  07/2005     Current Outpatient Prescriptions  Medication Sig Dispense Refill  . atenolol (TENORMIN) 25 MG tablet Take 1 tablet (25 mg total) by mouth daily.  90 tablet 1  . donepezil (ARICEPT) 5 MG tablet Take 5 mg by mouth daily.    . furosemide (LASIX) 20 MG tablet Take 1 tablet (20 mg total) by mouth daily. 90 tablet 3  . lisinopril (PRINIVIL,ZESTRIL) 10 MG tablet Take 1 tablet (10 mg total) by mouth daily. 90 tablet 1  . loratadine (CLARITIN) 10 MG tablet Take 10 mg by mouth daily.    . nitroGLYCERIN (NITROLINGUAL) 0.4 MG/SPRAY spray Place 1 spray under the tongue every 5 (five) minutes as needed for chest pain. 12 g 12  . pantoprazole (PROTONIX) 40 MG tablet Take 40 mg by mouth every evening.     . simvastatin (ZOCOR) 20 MG tablet Take 1 tablet (20 mg total) by mouth at bedtime. 90 tablet 1  . warfarin (COUMADIN) 2 MG tablet Take 2 tablets daily except 3 tablets on Wednesdays (Patient taking differently: Take 4-6 mg by mouth every morning. Take 2 tablets daily except 3 tablets on Wednesdays) 90 tablet 3   No current facility-administered medications for this visit.    Allergies:   Erythromycin    Social History:  The patient  reports that he quit smoking about 28 years ago. He started smoking about 69 years ago. He has never used smokeless tobacco. He reports that he drinks about 4.2 oz of  alcohol per week. He reports that he does not use illicit drugs.   Family History:  The patient's family history is not on file.    ROS: All other systems are reviewed and negative. Unless otherwise mentioned in H&P    PHYSICAL EXAM: VS:  BP 128/78 mmHg  Pulse 69  Ht 5\' 9"  (1.753 m)  Wt 160 lb (72.576 kg)  BMI 23.62 kg/m2  SpO2 94% , BMI Body mass index is 23.62 kg/(m^2). GEN: Well nourished, well developed, in no acute distress HEENT: normal Neck: no JVD, carotid bruits, or masses Cardiac: RRR; no murmurs, rubs, or gallops,no edema  Respiratory:  clear to auscultation bilaterally, normal work of breathing GI: soft, nontender, nondistended, + BS MS: no deformity or atrophy Skin: warm and dry, no rash Neuro:  Strength and sensation are  intact Psych: euthymic mood, full affect  Recent Labs: No results found for requested labs within last 365 days.    Lipid Panel    Component Value Date/Time   CHOL 162 09/15/2012 1020   TRIG 145 09/15/2012 1020   HDL 39* 09/15/2012 1020   CHOLHDL 4.2 09/15/2012 1020   VLDL 29 09/15/2012 1020   LDLCALC 94 09/15/2012 1020      Wt Readings from Last 3 Encounters:  04/26/16 160 lb (72.576 kg)  04/03/16 164 lb (74.39 kg)  04/03/16 163 lb (73.936 kg)      ASSESSMENT AND PLAN:  1.  PE: On coumadin with complaints of dark urine and stool. Will check hemoccult stool, LFT;s and INR. If LFT;s are elevated will stop Zocor. Urine will be checked as well. All results to Dr. Berdine Addison.   2. Hypertension: BP is normal. No changes in medication.    Current medicines are reviewed at length with the patient today.    Labs/ tests ordered today include: Stool for FOB, LFT;s, UA, and INR.  No orders of the defined types were placed in this encounter.     Disposition:   FU with keep appointment with coumadin clinic.  Signed, Jory Sims, NP  04/26/2016 3:26 PM    [facility] [address], [place], [place] [address] Phone: [phone]; Fax: [phone]

## 2016-04-29 DIAGNOSIS — E785 Hyperlipidemia, unspecified: Secondary | ICD-10-CM | POA: Diagnosis not present

## 2016-04-29 DIAGNOSIS — R531 Weakness: Secondary | ICD-10-CM | POA: Diagnosis not present

## 2016-04-30 LAB — HEPATIC FUNCTION PANEL
ALBUMIN: 4.2 g/dL (ref 3.2–4.6)
ALT: 13 IU/L (ref 0–44)
AST: 18 IU/L (ref 0–40)
Alkaline Phosphatase: 54 IU/L (ref 39–117)
BILIRUBIN TOTAL: 0.3 mg/dL (ref 0.0–1.2)
Bilirubin, Direct: 0.11 mg/dL (ref 0.00–0.40)
TOTAL PROTEIN: 6.5 g/dL (ref 6.0–8.5)

## 2016-04-30 LAB — URINALYSIS
BILIRUBIN UA: NEGATIVE
Glucose, UA: NEGATIVE
Nitrite, UA: NEGATIVE
PH UA: 6 (ref 5.0–7.5)
Specific Gravity, UA: 1.015 (ref 1.005–1.030)
Urobilinogen, Ur: 0.2 mg/dL (ref 0.2–1.0)

## 2016-04-30 LAB — HEMATOCRIT: HEMATOCRIT: 32.1 % — AB (ref 37.5–51.0)

## 2016-05-01 ENCOUNTER — Ambulatory Visit (INDEPENDENT_AMBULATORY_CARE_PROVIDER_SITE_OTHER): Payer: Medicare Other | Admitting: *Deleted

## 2016-05-01 DIAGNOSIS — I2699 Other pulmonary embolism without acute cor pulmonale: Secondary | ICD-10-CM

## 2016-05-01 DIAGNOSIS — Z5181 Encounter for therapeutic drug level monitoring: Secondary | ICD-10-CM | POA: Diagnosis not present

## 2016-05-01 LAB — POCT INR: INR: 2.8

## 2016-05-04 DIAGNOSIS — I10 Essential (primary) hypertension: Secondary | ICD-10-CM | POA: Diagnosis not present

## 2016-05-04 DIAGNOSIS — I2510 Atherosclerotic heart disease of native coronary artery without angina pectoris: Secondary | ICD-10-CM | POA: Diagnosis not present

## 2016-05-04 DIAGNOSIS — N184 Chronic kidney disease, stage 4 (severe): Secondary | ICD-10-CM | POA: Diagnosis not present

## 2016-05-04 DIAGNOSIS — Z7901 Long term (current) use of anticoagulants: Secondary | ICD-10-CM | POA: Diagnosis not present

## 2016-05-06 ENCOUNTER — Telehealth: Payer: Self-pay | Admitting: Cardiovascular Disease

## 2016-05-06 NOTE — Telephone Encounter (Signed)
Son states pt to Amoxicillin 500mg  4 tablets for dental procedure.  Told son this is OK and to continue current dose of coumadin.

## 2016-05-06 NOTE — Telephone Encounter (Signed)
Please call patient's son regarding patient being put on antibiotic. / tg

## 2016-05-15 DIAGNOSIS — D649 Anemia, unspecified: Secondary | ICD-10-CM | POA: Diagnosis not present

## 2016-05-15 DIAGNOSIS — N184 Chronic kidney disease, stage 4 (severe): Secondary | ICD-10-CM | POA: Diagnosis not present

## 2016-05-15 DIAGNOSIS — I10 Essential (primary) hypertension: Secondary | ICD-10-CM | POA: Diagnosis not present

## 2016-05-29 ENCOUNTER — Encounter (INDEPENDENT_AMBULATORY_CARE_PROVIDER_SITE_OTHER): Payer: Self-pay

## 2016-05-29 ENCOUNTER — Ambulatory Visit (INDEPENDENT_AMBULATORY_CARE_PROVIDER_SITE_OTHER): Payer: Medicare Other | Admitting: *Deleted

## 2016-05-29 DIAGNOSIS — I2699 Other pulmonary embolism without acute cor pulmonale: Secondary | ICD-10-CM

## 2016-05-29 DIAGNOSIS — I2782 Chronic pulmonary embolism: Secondary | ICD-10-CM | POA: Diagnosis not present

## 2016-05-29 DIAGNOSIS — Z5181 Encounter for therapeutic drug level monitoring: Secondary | ICD-10-CM

## 2016-05-29 LAB — POCT INR: INR: 2.8

## 2016-06-05 ENCOUNTER — Encounter: Payer: Self-pay | Admitting: Orthopaedic Surgery

## 2016-06-05 ENCOUNTER — Ambulatory Visit (INDEPENDENT_AMBULATORY_CARE_PROVIDER_SITE_OTHER): Payer: Medicare Other | Admitting: Orthopaedic Surgery

## 2016-06-05 VITALS — BP 153/75 | HR 80 | Temp 97.7°F | Wt 152.9 lb

## 2016-06-05 DIAGNOSIS — L608 Other nail disorders: Secondary | ICD-10-CM

## 2016-06-05 DIAGNOSIS — M25561 Pain in right knee: Secondary | ICD-10-CM | POA: Diagnosis not present

## 2016-06-05 DIAGNOSIS — L609 Nail disorder, unspecified: Secondary | ICD-10-CM

## 2016-06-05 DIAGNOSIS — I10 Essential (primary) hypertension: Secondary | ICD-10-CM

## 2016-06-05 DIAGNOSIS — I2510 Atherosclerotic heart disease of native coronary artery without angina pectoris: Secondary | ICD-10-CM | POA: Diagnosis not present

## 2016-06-05 NOTE — Progress Notes (Signed)
Patient Lance Grant, male DOB:[birth year], 82 y.o. [id number]  Chief Complaint  Patient presents with  . Follow-up    knee pain    HPI  Lance Grant is a 81 y.o. male who has chronic right knee pain.  He has effusion and popping but no giving way, no trauma, no redness.  He uses a cane.  He is less active than before. HPI  Body mass index is 22.58 kg/m.  ROS  Review of Systems  Constitutional:       Patient does not have Diabetes Mellitus. Patient has hypertension. Patient has COPD or shortness of breath. Patient does not have BMI > 35. Patient does not have current smoking history.   Cardiovascular:       History of hypertension, Arteriosclerotic cardiovascular disease, peripheral vascular disease.  Neurological: Negative for numbness.    Past Medical History:  Diagnosis Date  . Arteriosclerotic cardiovascular disease (ASCVD)    CABG 10/2001 nl EF  . Chronic anticoagulation    Managed by [facility]  . Chronic kidney disease, stage III (moderate)    Creatinine-1.9 08/2005, 1.5 02/2007, 1.7 12/2007, 1.7 06/2008  . COPD (chronic obstructive pulmonary disease) ([place])   . GERD (gastroesophageal reflux disease)   . Hyperlipidemia   . Hypertension   . Leg swelling    chronic  . Peripheral vascular disease ([place])   . Pleural effusion   . Pulmonary embolism [facility]) 2007   2007  . Seminal vesiculitis   . Sinus bradycardia    When on beta blockers    Past Surgical History:  Procedure Laterality Date  . CORONARY ARTERY BYPASS GRAFT  10/2001  . TRANSURETHRAL RESECTION OF PROSTATE  07/2005    History reviewed. No pertinent family history.  Social History Social History  Substance Use Topics  . Smoking status: Former Smoker    Packs/day: 1.00    Years: 10.00    Start date: 11/27/1946    Quit date: 11/05/1987  . Smokeless tobacco: Never Used  . Alcohol use 4.2 oz/week    7 Cans of beer per week     Comment: 1 can beer/day    Allergies  Allergen Reactions   . Erythromycin     Current Outpatient Prescriptions  Medication Sig Dispense Refill  . atenolol (TENORMIN) 25 MG tablet Take 1 tablet (25 mg total) by mouth daily. 90 tablet 1  . donepezil (ARICEPT) 5 MG tablet Take 5 mg by mouth daily.    . furosemide (LASIX) 20 MG tablet Take 1 tablet (20 mg total) by mouth daily. 90 tablet 3  . lisinopril (PRINIVIL,ZESTRIL) 10 MG tablet Take 1 tablet (10 mg total) by mouth daily. 90 tablet 1  . loratadine (CLARITIN) 10 MG tablet Take 10 mg by mouth daily.    . nitroGLYCERIN (NITROLINGUAL) 0.4 MG/SPRAY spray Place 1 spray under the tongue every 5 (five) minutes as needed for chest pain. 12 g 12  . pantoprazole (PROTONIX) 40 MG tablet Take 40 mg by mouth every evening.     . simvastatin (ZOCOR) 20 MG tablet Take 1 tablet (20 mg total) by mouth at bedtime. 90 tablet 1  . warfarin (COUMADIN) 2 MG tablet Take 2 tablets daily except 3 tablets on Wednesdays (Patient taking differently: Take 4-6 mg by mouth every morning. Take 2 tablets daily except 3 tablets on Wednesdays) 90 tablet 3   No current facility-administered medications for this visit.      Physical Exam  Blood pressure (!) 153/75, pulse 80, temperature 97.7  F (36.5 C), weight 152 lb 14.4 oz (69.4 kg).  Constitutional: overall normal hygiene, normal nutrition, well developed, normal grooming, normal body habitus. Assistive device:cane  Musculoskeletal: gait and station Limp right, muscle tone and strength are normal, no tremors or atrophy is present.  .  Neurological: coordination overall normal.  Deep tendon reflex/nerve stretch intact.  Sensation normal.  Cranial nerves II-XII intact.   Skin:   normal overall no scars, lesions, ulcers or rashes. No psoriasis.  Psychiatric: Alert and oriented x 3.  Recent memory intact, remote memory unclear.  Normal mood and affect. Well groomed.  Good eye contact.  Cardiovascular: overall no swelling, no varicosities, no edema bilaterally, normal  temperatures of the legs and arms, no clubbing, cyanosis and good capillary refill.  Lymphatic: palpation is normal.  The right lower extremity is examined:  Inspection:  Thigh:  Non-tender and no defects  Knee has swelling 1+ effusion.                        Joint tenderness is present                        Patient is tender over the medial joint line  Lower Leg:  Has normal appearance and no tenderness or defects  Ankle:  Non-tender and no defects  Foot:  Non-tender and no defects Range of Motion:  Knee:  Range of motion is: 0-100                        Crepitus is  present  Ankle:  Range of motion is normal. Strength and Tone:  The right lower extremity has normal strength and tone. Stability:  Knee:  The knee is stable.  Ankle:  The ankle is stable.  I pared his toe nails while he was here.  The patient has been educated about the nature of the problem(s) and counseled on treatment options.  The patient appeared to understand what I have discussed and is in agreement with it.  Encounter Diagnoses  Name Primary?  . Knee pain, right Yes  . Hyperkeratosis of nail   . Essential hypertension     PLAN Call if any problems.  Precautions discussed.  Continue current medications.   Return to clinic 2 months   Electronically Signed Sanjuana Kava, MD 8/2/201711:19 AM

## 2016-06-18 ENCOUNTER — Ambulatory Visit (INDEPENDENT_AMBULATORY_CARE_PROVIDER_SITE_OTHER): Payer: Medicare Other | Admitting: Orthopaedic Surgery

## 2016-06-18 ENCOUNTER — Encounter: Payer: Self-pay | Admitting: Orthopaedic Surgery

## 2016-06-18 VITALS — BP 160/74 | HR 69 | Temp 99.0°F | Wt 155.0 lb

## 2016-06-18 DIAGNOSIS — I10 Essential (primary) hypertension: Secondary | ICD-10-CM

## 2016-06-18 DIAGNOSIS — M25561 Pain in right knee: Secondary | ICD-10-CM

## 2016-06-18 NOTE — Progress Notes (Signed)
CC:  I have pain of my right knee. I would like an injection.  The patient has chronic pain of the right knee.  There is no recent trauma.  There is no redness.  Injections in the past have helped.  The knee has no redness, has an effusion and crepitus present.  ROM of the knee is 0-95.  Impression:  Chronic knee pain right.  Return: keep regular appointment.   PROCEDURE NOTE:  The patient requests injections of the right knee , verbal consent was obtained.  The right knee was prepped appropriately after time out was performed.   Sterile technique was observed and injection of 1 cc of Depo-Medrol 40 mg with several cc's of plain xylocaine. Anesthesia was provided by ethyl chloride and a 20-gauge needle was used to inject the knee area. The injection was tolerated well.  A band aid dressing was applied.  The patient was advised to apply ice later today and tomorrow to the injection sight as needed.   Electronically Signed Sanjuana Kava, MD 8/15/201710:38 AM

## 2016-06-23 ENCOUNTER — Other Ambulatory Visit: Payer: Self-pay | Admitting: Cardiovascular Disease

## 2016-06-26 ENCOUNTER — Ambulatory Visit (INDEPENDENT_AMBULATORY_CARE_PROVIDER_SITE_OTHER): Payer: Medicare Other | Admitting: *Deleted

## 2016-06-26 DIAGNOSIS — I2699 Other pulmonary embolism without acute cor pulmonale: Secondary | ICD-10-CM

## 2016-06-26 DIAGNOSIS — Z5181 Encounter for therapeutic drug level monitoring: Secondary | ICD-10-CM

## 2016-06-26 LAB — POCT INR: INR: 5.7

## 2016-07-01 ENCOUNTER — Ambulatory Visit (INDEPENDENT_AMBULATORY_CARE_PROVIDER_SITE_OTHER): Payer: Medicare Other | Admitting: *Deleted

## 2016-07-01 DIAGNOSIS — I2699 Other pulmonary embolism without acute cor pulmonale: Secondary | ICD-10-CM | POA: Diagnosis not present

## 2016-07-01 DIAGNOSIS — Z5181 Encounter for therapeutic drug level monitoring: Secondary | ICD-10-CM

## 2016-07-01 LAB — POCT INR: INR: 3.1

## 2016-07-22 ENCOUNTER — Ambulatory Visit (INDEPENDENT_AMBULATORY_CARE_PROVIDER_SITE_OTHER): Payer: Medicare Other | Admitting: *Deleted

## 2016-07-22 DIAGNOSIS — Z5181 Encounter for therapeutic drug level monitoring: Secondary | ICD-10-CM

## 2016-07-22 DIAGNOSIS — I2699 Other pulmonary embolism without acute cor pulmonale: Secondary | ICD-10-CM

## 2016-07-22 LAB — POCT INR: INR: 2.8

## 2016-07-30 DIAGNOSIS — Z7901 Long term (current) use of anticoagulants: Secondary | ICD-10-CM | POA: Diagnosis not present

## 2016-07-30 DIAGNOSIS — Z23 Encounter for immunization: Secondary | ICD-10-CM | POA: Diagnosis not present

## 2016-07-30 DIAGNOSIS — N183 Chronic kidney disease, stage 3 (moderate): Secondary | ICD-10-CM | POA: Diagnosis not present

## 2016-07-30 DIAGNOSIS — R310 Gross hematuria: Secondary | ICD-10-CM | POA: Diagnosis not present

## 2016-07-30 DIAGNOSIS — I10 Essential (primary) hypertension: Secondary | ICD-10-CM | POA: Diagnosis not present

## 2016-07-31 DIAGNOSIS — I10 Essential (primary) hypertension: Secondary | ICD-10-CM | POA: Diagnosis not present

## 2016-07-31 DIAGNOSIS — N390 Urinary tract infection, site not specified: Secondary | ICD-10-CM | POA: Diagnosis not present

## 2016-08-06 ENCOUNTER — Other Ambulatory Visit (HOSPITAL_COMMUNITY): Payer: Self-pay | Admitting: Family Medicine

## 2016-08-06 ENCOUNTER — Ambulatory Visit: Payer: Medicare Other | Admitting: Orthopaedic Surgery

## 2016-08-06 DIAGNOSIS — N2889 Other specified disorders of kidney and ureter: Secondary | ICD-10-CM

## 2016-08-08 ENCOUNTER — Ambulatory Visit (HOSPITAL_COMMUNITY): Payer: Medicare Other

## 2016-08-08 ENCOUNTER — Ambulatory Visit (HOSPITAL_COMMUNITY)
Admission: RE | Admit: 2016-08-08 | Discharge: 2016-08-08 | Disposition: A | Discharge status: Home / Self Care | Payer: Medicare Other | Source: Ambulatory Visit | Attending: Family Medicine | Admitting: Family Medicine

## 2016-08-08 DIAGNOSIS — N2889 Other specified disorders of kidney and ureter: Secondary | ICD-10-CM | POA: Diagnosis not present

## 2016-08-08 DIAGNOSIS — N261 Atrophy of kidney (terminal): Secondary | ICD-10-CM | POA: Diagnosis not present

## 2016-08-08 DIAGNOSIS — R93422 Abnormal radiologic findings on diagnostic imaging of left kidney: Secondary | ICD-10-CM | POA: Diagnosis not present

## 2016-08-13 ENCOUNTER — Ambulatory Visit: Payer: Medicare Other | Admitting: Orthopaedic Surgery

## 2016-08-13 ENCOUNTER — Encounter: Payer: Self-pay | Admitting: Orthopaedic Surgery

## 2016-08-16 DIAGNOSIS — R310 Gross hematuria: Secondary | ICD-10-CM | POA: Diagnosis not present

## 2016-08-16 DIAGNOSIS — D4102 Neoplasm of uncertain behavior of left kidney: Secondary | ICD-10-CM | POA: Diagnosis not present

## 2016-08-17 DIAGNOSIS — N2889 Other specified disorders of kidney and ureter: Secondary | ICD-10-CM | POA: Diagnosis not present

## 2016-08-17 DIAGNOSIS — H6123 Impacted cerumen, bilateral: Secondary | ICD-10-CM | POA: Diagnosis not present

## 2016-08-17 DIAGNOSIS — R310 Gross hematuria: Secondary | ICD-10-CM | POA: Diagnosis not present

## 2016-08-17 DIAGNOSIS — I10 Essential (primary) hypertension: Secondary | ICD-10-CM | POA: Diagnosis not present

## 2016-08-19 ENCOUNTER — Ambulatory Visit (INDEPENDENT_AMBULATORY_CARE_PROVIDER_SITE_OTHER): Payer: Medicare Other | Admitting: *Deleted

## 2016-08-19 DIAGNOSIS — Z5181 Encounter for therapeutic drug level monitoring: Secondary | ICD-10-CM | POA: Diagnosis not present

## 2016-08-19 DIAGNOSIS — I2699 Other pulmonary embolism without acute cor pulmonale: Secondary | ICD-10-CM | POA: Diagnosis not present

## 2016-08-19 LAB — POCT INR: INR: 2.2

## 2016-08-20 ENCOUNTER — Encounter: Payer: Self-pay | Admitting: Orthopaedic Surgery

## 2016-08-20 ENCOUNTER — Ambulatory Visit (INDEPENDENT_AMBULATORY_CARE_PROVIDER_SITE_OTHER): Payer: No Typology Code available for payment source | Admitting: Orthopaedic Surgery

## 2016-08-20 DIAGNOSIS — M25561 Pain in right knee: Secondary | ICD-10-CM | POA: Diagnosis not present

## 2016-08-20 DIAGNOSIS — G8929 Other chronic pain: Secondary | ICD-10-CM

## 2016-08-20 NOTE — Progress Notes (Signed)
CC:  I have pain of my right knee. I would like an injection.  The patient has chronic pain of the right knee.  There is no recent trauma.  There is no redness.  Injections in the past have helped.  The knee has no redness, has an effusion and crepitus present.  ROM of the right knee is 0-100.  Impression:  Chronic knee pain right  Return: 6 weeks  PROCEDURE NOTE:  The patient requests injections of the right knee , verbal consent was obtained.  The right knee was prepped appropriately after time out was performed.   Sterile technique was observed and injection of 1 cc of Depo-Medrol 40 mg with several cc's of plain xylocaine. Anesthesia was provided by ethyl chloride and a 20-gauge needle was used to inject the knee area. The injection was tolerated well.  A band aid dressing was applied.  The patient was advised to apply ice later today and tomorrow to the injection sight as needed.  Electronically Signed Sanjuana Kava, MD 10/17/201710:36 AM

## 2016-08-22 ENCOUNTER — Ambulatory Visit: Payer: Medicare Other | Admitting: Orthopaedic Surgery

## 2016-08-22 DIAGNOSIS — K7689 Other specified diseases of liver: Secondary | ICD-10-CM | POA: Diagnosis not present

## 2016-08-22 DIAGNOSIS — D4102 Neoplasm of uncertain behavior of left kidney: Secondary | ICD-10-CM | POA: Diagnosis not present

## 2016-09-02 DIAGNOSIS — H6123 Impacted cerumen, bilateral: Secondary | ICD-10-CM | POA: Diagnosis not present

## 2016-09-23 ENCOUNTER — Ambulatory Visit (INDEPENDENT_AMBULATORY_CARE_PROVIDER_SITE_OTHER): Payer: Medicare Other | Admitting: *Deleted

## 2016-09-23 DIAGNOSIS — I2699 Other pulmonary embolism without acute cor pulmonale: Secondary | ICD-10-CM

## 2016-09-23 DIAGNOSIS — I2510 Atherosclerotic heart disease of native coronary artery without angina pectoris: Secondary | ICD-10-CM | POA: Diagnosis not present

## 2016-09-23 DIAGNOSIS — Z5181 Encounter for therapeutic drug level monitoring: Secondary | ICD-10-CM

## 2016-09-23 LAB — POCT INR: INR: 2.5

## 2016-10-01 ENCOUNTER — Ambulatory Visit: Admitting: Orthopaedic Surgery

## 2016-10-03 ENCOUNTER — Encounter: Payer: Self-pay | Admitting: Orthopaedic Surgery

## 2016-10-09 ENCOUNTER — Ambulatory Visit (INDEPENDENT_AMBULATORY_CARE_PROVIDER_SITE_OTHER): Payer: No Typology Code available for payment source | Admitting: Orthopaedic Surgery

## 2016-10-09 VITALS — BP 138/74 | HR 65 | Temp 97.7°F

## 2016-10-09 DIAGNOSIS — L608 Other nail disorders: Secondary | ICD-10-CM | POA: Diagnosis not present

## 2016-10-09 DIAGNOSIS — G8929 Other chronic pain: Secondary | ICD-10-CM | POA: Diagnosis not present

## 2016-10-09 DIAGNOSIS — M25561 Pain in right knee: Secondary | ICD-10-CM | POA: Diagnosis not present

## 2016-10-09 DIAGNOSIS — I10 Essential (primary) hypertension: Secondary | ICD-10-CM

## 2016-10-09 DIAGNOSIS — I2510 Atherosclerotic heart disease of native coronary artery without angina pectoris: Secondary | ICD-10-CM

## 2016-10-09 NOTE — Progress Notes (Signed)
Patient Lance Grant, male DOB:Apr 14, 1921, 82 y.o. [id number]  Chief Complaint  Patient presents with  . Follow-up    Knee pain    HPI  Lance Grant is a 82 y.o. male who has pain of his right knee and both feet.  He has no giving way of the knee. He has swelling and popping.  He has no new trauma. HPI  There is no height or weight on file to calculate BMI.  ROS  Review of Systems  Constitutional:       Patient does not have Diabetes Mellitus. Patient has hypertension. Patient has COPD or shortness of breath. Patient does not have BMI > 35. Patient does not have current smoking history.   HENT: Negative for congestion.   Respiratory: Negative for cough and shortness of breath.   Cardiovascular: Negative for chest pain and leg swelling.       History of hypertension, Arteriosclerotic cardiovascular disease, peripheral vascular disease.  Endocrine: Positive for cold intolerance.  Musculoskeletal: Positive for arthralgias, back pain and gait problem.  Allergic/Immunologic: Positive for environmental allergies.  Neurological: Negative for numbness.    Past Medical History:  Diagnosis Date  . Arteriosclerotic cardiovascular disease (ASCVD)    CABG 10/2001 nl EF  . Chronic anticoagulation    Managed by [facility]  . Chronic kidney disease, stage III (moderate)    Creatinine-1.9 08/2005, 1.5 02/2007, 1.7 12/2007, 1.7 06/2008  . COPD (chronic obstructive pulmonary disease) ([place])   . GERD (gastroesophageal reflux disease)   . Hyperlipidemia   . Hypertension   . Leg swelling    chronic  . Peripheral vascular disease ([place])   . Pleural effusion   . Pulmonary embolism [facility]) 2007   2007  . Seminal vesiculitis   . Sinus bradycardia    When on beta blockers    Past Surgical History:  Procedure Laterality Date  . CORONARY ARTERY BYPASS GRAFT  10/2001  . TRANSURETHRAL RESECTION OF PROSTATE  07/2005    No family history on file.  Social History Social History   Substance Use Topics  . Smoking status: Former Smoker    Packs/day: 1.00    Years: 10.00    Start date: 11/27/1946    Quit date: 11/05/1987  . Smokeless tobacco: Never Used  . Alcohol use 4.2 oz/week    7 Cans of beer per week     Comment: 1 can beer/day    Allergies  Allergen Reactions  . Erythromycin     Current Outpatient Prescriptions  Medication Sig Dispense Refill  . atenolol (TENORMIN) 25 MG tablet Take 1 tablet (25 mg total) by mouth daily. 90 tablet 1  . donepezil (ARICEPT) 5 MG tablet Take 5 mg by mouth daily.    . furosemide (LASIX) 20 MG tablet Take 1 tablet (20 mg total) by mouth daily. 90 tablet 3  . lisinopril (PRINIVIL,ZESTRIL) 10 MG tablet Take 1 tablet (10 mg total) by mouth daily. 90 tablet 1  . loratadine (CLARITIN) 10 MG tablet Take 10 mg by mouth daily.    . nitroGLYCERIN (NITROLINGUAL) 0.4 MG/SPRAY spray Place 1 spray under the tongue every 5 (five) minutes as needed for chest pain. 12 g 12  . pantoprazole (PROTONIX) 40 MG tablet Take 40 mg by mouth every evening.     . simvastatin (ZOCOR) 20 MG tablet Take 1 tablet (20 mg total) by mouth at bedtime. 90 tablet 1  . warfarin (COUMADIN) 2 MG tablet TAKE 2 TABLETS BY MOUTH DAILY EXCEPT 3 TABLETS  ON WEDNESDAY 90 tablet 3   No current facility-administered medications for this visit.      Physical Exam  Blood pressure 138/74, pulse 65, temperature 97.7 F (36.5 C).  Constitutional: overall normal hygiene, normal nutrition, well developed, normal grooming, normal body habitus. Assistive device:cane  Musculoskeletal: gait and station Limp right, muscle tone and strength are normal, no tremors or atrophy is present.  .  Neurological: coordination overall normal.  Deep tendon reflex/nerve stretch intact.  Sensation normal.  Cranial nerves II-XII intact.   Skin:   Normal overall no scars, lesions, ulcers or rashes. No psoriasis.  Psychiatric: Alert and oriented x 3.  Recent memory intact, remote memory  unclear.  Normal mood and affect. Well groomed.  Good eye contact.  Cardiovascular: overall no swelling, no varicosities, no edema bilaterally, normal temperatures of the legs and arms, no clubbing, cyanosis and good capillary refill.  Lymphatic: palpation is normal.  The right lower extremity is examined:  Inspection:  Thigh:  Non-tender and no defects  Knee has swelling 1+ effusion.                        Joint tenderness is present                        Patient is tender over the medial joint line  Lower Leg:  Has normal appearance and no tenderness or defects  Ankle:  Non-tender and no defects  Foot:  Non-tender and no defects Range of Motion:  Knee:  Range of motion is: 0-105                        Crepitus is  present  Ankle:  Range of motion is normal. Strength and Tone:  The right lower extremity has normal strength and tone. Stability:  Knee:  The knee is stable.  Ankle:  The ankle is stable.    The patient has been educated about the nature of the problem(s) and counseled on treatment options.  The patient appeared to understand what I have discussed and is in agreement with it.  Encounter Diagnoses  Name Primary?  . Chronic pain of right knee Yes  . Essential hypertension   . Hyperkeratosis of nail     PLAN Call if any problems.  Precautions discussed.  Continue current medications.   Return to clinic 2 months   I pared his nails while he was here.  Electronically Signed Sanjuana Kava, MD 12/6/201711:24 AM

## 2016-10-18 ENCOUNTER — Encounter: Payer: Self-pay | Admitting: Cardiovascular Disease

## 2016-10-18 ENCOUNTER — Ambulatory Visit (INDEPENDENT_AMBULATORY_CARE_PROVIDER_SITE_OTHER): Payer: No Typology Code available for payment source | Admitting: Cardiovascular Disease

## 2016-10-18 ENCOUNTER — Ambulatory Visit (INDEPENDENT_AMBULATORY_CARE_PROVIDER_SITE_OTHER): Payer: Medicare Other | Admitting: *Deleted

## 2016-10-18 VITALS — BP 134/66 | HR 94 | Ht 69.0 in | Wt 145.0 lb

## 2016-10-18 DIAGNOSIS — I2510 Atherosclerotic heart disease of native coronary artery without angina pectoris: Secondary | ICD-10-CM

## 2016-10-18 DIAGNOSIS — I25810 Atherosclerosis of coronary artery bypass graft(s) without angina pectoris: Secondary | ICD-10-CM | POA: Diagnosis not present

## 2016-10-18 DIAGNOSIS — Z5181 Encounter for therapeutic drug level monitoring: Secondary | ICD-10-CM | POA: Diagnosis not present

## 2016-10-18 DIAGNOSIS — I10 Essential (primary) hypertension: Secondary | ICD-10-CM | POA: Diagnosis not present

## 2016-10-18 DIAGNOSIS — I2699 Other pulmonary embolism without acute cor pulmonale: Secondary | ICD-10-CM

## 2016-10-18 DIAGNOSIS — I2782 Chronic pulmonary embolism: Secondary | ICD-10-CM

## 2016-10-18 DIAGNOSIS — R600 Localized edema: Secondary | ICD-10-CM

## 2016-10-18 DIAGNOSIS — E7800 Pure hypercholesterolemia, unspecified: Secondary | ICD-10-CM | POA: Diagnosis not present

## 2016-10-18 LAB — POCT INR: INR: 3.1

## 2016-10-18 NOTE — Patient Instructions (Addendum)
Your physician wants you to follow-up in: 6 months Dr Virgina Jock will receive a reminder letter in the mail two months in advance. If you don't receive a letter, please call our office to schedule the follow-up appointment.   Your physician recommends that you continue on your current medications as directed. Please refer to the Current Medication list given to you today.       Your INR today is 3.1      Thank you for choosing [facility] !

## 2016-10-18 NOTE — Progress Notes (Signed)
SUBJECTIVE: Lance Grant is a pleasant 82 yr old male with a PMH significant for pulmonary embolism, CAD s/p CABG, essential hypertension, hyperlipidemia, CKD stage III, and GERD.  He denies chest pain and shortness of breath, as well as palpitations and dizziness.  He and his wife have a nurse who stays with them 4 hours daily.  He developed leg pain from wearing compression stockings and had difficulty getting them on and off back in May 2017.  He feels well and is without complaints.    Soc: He grew up in [place], and came to [facility] after he retired from the service. He was initially in the [employer], and then was in [employer]. He attended [employer]. 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   Review of Systems: As per "subjective", otherwise negative.  Allergies  Allergen Reactions  . Erythromycin     Current Outpatient Prescriptions  Medication Sig Dispense Refill  . atenolol (TENORMIN) 25 MG tablet Take 1 tablet (25 mg total) by mouth daily. 90 tablet 1  . donepezil (ARICEPT) 5 MG tablet Take 5 mg by mouth daily.    . furosemide (LASIX) 20 MG tablet Take 1 tablet (20 mg total) by mouth daily. 90 tablet 3  . lisinopril (PRINIVIL,ZESTRIL) 10 MG tablet Take 1 tablet (10 mg total) by mouth daily. 90 tablet 1  . loratadine (CLARITIN) 10 MG tablet Take 10 mg by mouth daily.    . nitroGLYCERIN (NITROLINGUAL) 0.4 MG/SPRAY spray Place 1 spray under the tongue every 5 (five) minutes as needed for chest pain. 12 g 12  . pantoprazole (PROTONIX) 40 MG tablet Take 40 mg by mouth every evening.     . simvastatin (ZOCOR) 20 MG tablet Take 1 tablet (20 mg total) by mouth at bedtime. 90 tablet 1  . warfarin (COUMADIN) 2 MG tablet TAKE 2 TABLETS BY MOUTH DAILY EXCEPT 3 TABLETS ON WEDNESDAY 90 tablet 3   No current facility-administered medications for this visit.     Past Medical History:  Diagnosis  Date  . Arteriosclerotic cardiovascular disease (ASCVD)    CABG 10/2001 nl EF  . Chronic anticoagulation    Managed by [facility]  . Chronic kidney disease, stage III (moderate)    Creatinine-1.9 08/2005, 1.5 02/2007, 1.7 12/2007, 1.7 06/2008  . COPD (chronic obstructive pulmonary disease) ([place])   . GERD (gastroesophageal reflux disease)   . Hyperlipidemia   . Hypertension   . Leg swelling    chronic  . Peripheral vascular disease ([place])   . Pleural effusion   . Pulmonary embolism [facility]) 2007   2007  . Seminal vesiculitis   . Sinus bradycardia    When on beta blockers    Past Surgical History:  Procedure Laterality Date  . CORONARY ARTERY BYPASS GRAFT  10/2001  . TRANSURETHRAL RESECTION OF PROSTATE  07/2005    Social History   Social History  . Marital status: Married    Spouse name: N/A  . Number of children: 2  . Years of education: N/A   Occupational History  . Retired     [employer]  . [employer] for a total of 17 years; recently declined renominationb   Social History Main Topics  . Smoking status: Former Smoker    Packs/day: 1.00    Years: 10.00    Start  date: 11/27/1946    Quit date: 11/05/1987  . Smokeless tobacco: Never Used  . Alcohol use 4.2 oz/week    7 Cans of beer per week     Comment: 1 can beer/day  . Drug use: No  . Sexual activity: Not on file   Other Topics Concern  . Not on file   Social History Narrative   Married   2 children, 2 grandchildren   No regular exercise     Vitals:   10/18/16 1055  BP: 134/66  Pulse: 94  SpO2: 98%  Weight: 145 lb (65.8 kg)  Height: 5\' 9"  (1.753 m)    PHYSICAL EXAM General: NAD. HEENT: Normal. Neck: No JVD, no thyromegaly. Lungs: Clear to auscultation bilaterally with normal respiratory effort. CV: Nondisplaced PMI. Regular rate and rhythm, normal S1/S2, no [id number], soft 1/6 systolic murmur along left sternal border and RUSB. Trace periankle edema  b/l. Abdomen: Soft, nontender, no distention.  Neurologic: Alert and oriented.  Psych: Normal affect. Skin: Normal.    ECG: Most recent ECG reviewed.      ASSESSMENT AND PLAN: 1. CAD s/p CABG: Symptomatically stable. Continue atenolol and simvastatin.  2. Pulmonary embolism: On Warfarin. Will check INR.  3. Essential HTN: Controlled. No changes.  4. Hyperlipidemia: On simvastatin 20 mg. No changes.  5. Lower extremity edema: Takes Lasix 20 mg daily. No changes to dose. Did not tolerate compression stockings.  Dispo: f/u 6 months.   [name], M.D., F.A.C.C.

## 2016-11-05 DIAGNOSIS — D4102 Neoplasm of uncertain behavior of left kidney: Secondary | ICD-10-CM | POA: Diagnosis not present

## 2016-11-06 ENCOUNTER — Telehealth: Payer: Self-pay | Admitting: *Deleted

## 2016-11-06 NOTE — Telephone Encounter (Signed)
Please give pt's son a call @ [phone] cell # or  office # in the mornings till around [phone]

## 2016-11-07 ENCOUNTER — Inpatient Hospital Stay (HOSPITAL_COMMUNITY)
Admission: EM | Admit: 2016-11-07 | Discharge: 2016-11-12 | DRG: 872 | Disposition: A | Discharge status: Skilled Nursing Facility | Payer: Medicare Other | Attending: Nephrology | Admitting: Nephrology

## 2016-11-07 ENCOUNTER — Emergency Department (HOSPITAL_COMMUNITY): Payer: Medicare Other

## 2016-11-07 ENCOUNTER — Encounter (HOSPITAL_COMMUNITY): Payer: Self-pay

## 2016-11-07 DIAGNOSIS — M6281 Muscle weakness (generalized): Secondary | ICD-10-CM | POA: Diagnosis not present

## 2016-11-07 DIAGNOSIS — I110 Hypertensive heart disease with heart failure: Secondary | ICD-10-CM | POA: Diagnosis not present

## 2016-11-07 DIAGNOSIS — K219 Gastro-esophageal reflux disease without esophagitis: Secondary | ICD-10-CM | POA: Diagnosis present

## 2016-11-07 DIAGNOSIS — D696 Thrombocytopenia, unspecified: Secondary | ICD-10-CM | POA: Diagnosis present

## 2016-11-07 DIAGNOSIS — J449 Chronic obstructive pulmonary disease, unspecified: Secondary | ICD-10-CM | POA: Diagnosis present

## 2016-11-07 DIAGNOSIS — N183 Chronic kidney disease, stage 3 unspecified: Secondary | ICD-10-CM | POA: Diagnosis present

## 2016-11-07 DIAGNOSIS — Z86711 Personal history of pulmonary embolism: Secondary | ICD-10-CM | POA: Diagnosis not present

## 2016-11-07 DIAGNOSIS — N179 Acute kidney failure, unspecified: Secondary | ICD-10-CM | POA: Diagnosis not present

## 2016-11-07 DIAGNOSIS — Z87891 Personal history of nicotine dependence: Secondary | ICD-10-CM

## 2016-11-07 DIAGNOSIS — M79651 Pain in right thigh: Secondary | ICD-10-CM | POA: Diagnosis present

## 2016-11-07 DIAGNOSIS — N3001 Acute cystitis with hematuria: Secondary | ICD-10-CM | POA: Diagnosis not present

## 2016-11-07 DIAGNOSIS — W19XXXA Unspecified fall, initial encounter: Secondary | ICD-10-CM | POA: Diagnosis not present

## 2016-11-07 DIAGNOSIS — Z66 Do not resuscitate: Secondary | ICD-10-CM | POA: Diagnosis present

## 2016-11-07 DIAGNOSIS — I959 Hypotension, unspecified: Secondary | ICD-10-CM | POA: Diagnosis present

## 2016-11-07 DIAGNOSIS — I129 Hypertensive chronic kidney disease with stage 1 through stage 4 chronic kidney disease, or unspecified chronic kidney disease: Secondary | ICD-10-CM | POA: Diagnosis present

## 2016-11-07 DIAGNOSIS — N029 Recurrent and persistent hematuria with unspecified morphologic changes: Secondary | ICD-10-CM | POA: Diagnosis present

## 2016-11-07 DIAGNOSIS — Z9181 History of falling: Secondary | ICD-10-CM | POA: Diagnosis not present

## 2016-11-07 DIAGNOSIS — E441 Mild protein-calorie malnutrition: Secondary | ICD-10-CM | POA: Diagnosis not present

## 2016-11-07 DIAGNOSIS — I739 Peripheral vascular disease, unspecified: Secondary | ICD-10-CM | POA: Diagnosis present

## 2016-11-07 DIAGNOSIS — R52 Pain, unspecified: Secondary | ICD-10-CM | POA: Diagnosis not present

## 2016-11-07 DIAGNOSIS — Z881 Allergy status to other antibiotic agents status: Secondary | ICD-10-CM

## 2016-11-07 DIAGNOSIS — B961 Klebsiella pneumoniae [K. pneumoniae] as the cause of diseases classified elsewhere: Secondary | ICD-10-CM | POA: Diagnosis present

## 2016-11-07 DIAGNOSIS — I2510 Atherosclerotic heart disease of native coronary artery without angina pectoris: Secondary | ICD-10-CM | POA: Diagnosis present

## 2016-11-07 DIAGNOSIS — N2889 Other specified disorders of kidney and ureter: Secondary | ICD-10-CM | POA: Diagnosis present

## 2016-11-07 DIAGNOSIS — E875 Hyperkalemia: Secondary | ICD-10-CM | POA: Diagnosis present

## 2016-11-07 DIAGNOSIS — I10 Essential (primary) hypertension: Secondary | ICD-10-CM | POA: Diagnosis not present

## 2016-11-07 DIAGNOSIS — Z6821 Body mass index (BMI) 21.0-21.9, adult: Secondary | ICD-10-CM

## 2016-11-07 DIAGNOSIS — D649 Anemia, unspecified: Secondary | ICD-10-CM | POA: Diagnosis not present

## 2016-11-07 DIAGNOSIS — S8991XA Unspecified injury of right lower leg, initial encounter: Secondary | ICD-10-CM | POA: Diagnosis not present

## 2016-11-07 DIAGNOSIS — R1312 Dysphagia, oropharyngeal phase: Secondary | ICD-10-CM | POA: Diagnosis not present

## 2016-11-07 DIAGNOSIS — R600 Localized edema: Secondary | ICD-10-CM | POA: Diagnosis not present

## 2016-11-07 DIAGNOSIS — E785 Hyperlipidemia, unspecified: Secondary | ICD-10-CM | POA: Diagnosis present

## 2016-11-07 DIAGNOSIS — S299XXA Unspecified injury of thorax, initial encounter: Secondary | ICD-10-CM | POA: Diagnosis not present

## 2016-11-07 DIAGNOSIS — F0390 Unspecified dementia without behavioral disturbance: Secondary | ICD-10-CM | POA: Diagnosis present

## 2016-11-07 DIAGNOSIS — Z951 Presence of aortocoronary bypass graft: Secondary | ICD-10-CM

## 2016-11-07 DIAGNOSIS — Z79899 Other long term (current) drug therapy: Secondary | ICD-10-CM

## 2016-11-07 DIAGNOSIS — W010XXA Fall on same level from slipping, tripping and stumbling without subsequent striking against object, initial encounter: Secondary | ICD-10-CM | POA: Diagnosis present

## 2016-11-07 DIAGNOSIS — Z7901 Long term (current) use of anticoagulants: Secondary | ICD-10-CM

## 2016-11-07 DIAGNOSIS — R279 Unspecified lack of coordination: Secondary | ICD-10-CM | POA: Diagnosis not present

## 2016-11-07 DIAGNOSIS — A419 Sepsis, unspecified organism: Principal | ICD-10-CM | POA: Diagnosis present

## 2016-11-07 DIAGNOSIS — D62 Acute posthemorrhagic anemia: Secondary | ICD-10-CM | POA: Diagnosis present

## 2016-11-07 DIAGNOSIS — N390 Urinary tract infection, site not specified: Secondary | ICD-10-CM | POA: Diagnosis present

## 2016-11-07 DIAGNOSIS — N189 Chronic kidney disease, unspecified: Secondary | ICD-10-CM

## 2016-11-07 LAB — URINALYSIS, ROUTINE W REFLEX MICROSCOPIC
BILIRUBIN URINE: NEGATIVE
Glucose, UA: 250 mg/dL — AB
Nitrite: POSITIVE — AB
Specific Gravity, Urine: 1.015 (ref 1.005–1.030)
pH: 6.5 (ref 5.0–8.0)

## 2016-11-07 LAB — COMPREHENSIVE METABOLIC PANEL
ALBUMIN: 3.2 g/dL — AB (ref 3.5–5.0)
ALT: 33 U/L (ref 17–63)
AST: 65 U/L — AB (ref 15–41)
Alkaline Phosphatase: 46 U/L (ref 38–126)
Anion gap: 6 (ref 5–15)
BUN: 67 mg/dL — AB (ref 6–20)
CHLORIDE: 103 mmol/L (ref 101–111)
CO2: 23 mmol/L (ref 22–32)
CREATININE: 4.34 mg/dL — AB (ref 0.61–1.24)
Calcium: 9.3 mg/dL (ref 8.9–10.3)
GFR calc Af Amer: 12 mL/min — ABNORMAL LOW (ref >60)
GFR calc non Af Amer: 11 mL/min — ABNORMAL LOW (ref >60)
GLUCOSE: 106 mg/dL — AB (ref 65–99)
Potassium: 5.4 mmol/L — ABNORMAL HIGH (ref 3.5–5.1)
SODIUM: 132 mmol/L — AB (ref 135–145)
Total Bilirubin: 0.5 mg/dL (ref 0.3–1.2)
Total Protein: 6.6 g/dL (ref 6.5–8.1)

## 2016-11-07 LAB — CBC WITH DIFFERENTIAL/PLATELET
Basophils Absolute: 0 10*3/uL (ref 0.0–0.1)
Basophils Relative: 0 %
EOS ABS: 0 10*3/uL (ref 0.0–0.7)
Eosinophils Relative: 0 %
HCT: 28.8 % — ABNORMAL LOW (ref 39.0–52.0)
HEMOGLOBIN: 10 g/dL — AB (ref 13.0–17.0)
LYMPHS ABS: 0.8 10*3/uL (ref 0.7–4.0)
Lymphocytes Relative: 7 %
MCH: 28.8 pg (ref 26.0–34.0)
MCHC: 34.7 g/dL (ref 30.0–36.0)
MCV: 83 fL (ref 78.0–100.0)
MONO ABS: 1 10*3/uL (ref 0.1–1.0)
MONOS PCT: 9 %
NEUTROS PCT: 84 %
Neutro Abs: 10.1 10*3/uL — ABNORMAL HIGH (ref 1.7–7.7)
Platelets: 153 10*3/uL (ref 150–400)
RBC: 3.47 MIL/uL — ABNORMAL LOW (ref 4.22–5.81)
RDW: 15.7 % — ABNORMAL HIGH (ref 11.5–15.5)
WBC: 11.9 10*3/uL — ABNORMAL HIGH (ref 4.0–10.5)

## 2016-11-07 LAB — PROTIME-INR
INR: 2.98
INR: 3.33
PROTHROMBIN TIME: 31.6 s — AB (ref 11.4–15.2)
PROTHROMBIN TIME: 34.6 s — AB (ref 11.4–15.2)

## 2016-11-07 LAB — URINALYSIS, MICROSCOPIC (REFLEX)

## 2016-11-07 LAB — LACTIC ACID, PLASMA
LACTIC ACID, VENOUS: 0.9 mmol/L (ref 0.5–1.9)
LACTIC ACID, VENOUS: 1.2 mmol/L (ref 0.5–1.9)

## 2016-11-07 LAB — PROCALCITONIN: PROCALCITONIN: 13.98 ng/mL

## 2016-11-07 LAB — I-STAT CG4 LACTIC ACID, ED: LACTIC ACID, VENOUS: 1.15 mmol/L (ref 0.5–1.9)

## 2016-11-07 LAB — APTT: aPTT: 64 seconds — ABNORMAL HIGH (ref 24–36)

## 2016-11-07 LAB — POC OCCULT BLOOD, ED: FECAL OCCULT BLD: NEGATIVE

## 2016-11-07 MED ORDER — SODIUM CHLORIDE 0.9% FLUSH
3.0000 mL | Freq: Two times a day (BID) | INTRAVENOUS | Status: DC
Start: 2016-11-07 — End: 2016-11-12
  Administered 2016-11-07 – 2016-11-11 (×9): 3 mL via INTRAVENOUS

## 2016-11-07 MED ORDER — PIPERACILLIN-TAZOBACTAM IN DEX 2-0.25 GM/50ML IV SOLN
INTRAVENOUS | Status: AC
  Filled 2016-11-07: qty 100

## 2016-11-07 MED ORDER — PIPERACILLIN-TAZOBACTAM 3.375 G IVPB 30 MIN
3.3750 g | Freq: Once | INTRAVENOUS | Status: AC
  Administered 2016-11-07: 3.375 g via INTRAVENOUS
  Filled 2016-11-07: qty 50

## 2016-11-07 MED ORDER — ACETAMINOPHEN 325 MG PO TABS
650.0000 mg | ORAL_TABLET | Freq: Four times a day (QID) | ORAL | Status: DC | PRN
  Administered 2016-11-08 – 2016-11-11 (×5): 650 mg via ORAL
  Filled 2016-11-07 (×5): qty 2

## 2016-11-07 MED ORDER — PIPERACILLIN SOD-TAZOBACTAM SO 2.25 (2-0.25) G IV SOLR
2.2500 g | Freq: Three times a day (TID) | INTRAVENOUS | Status: DC
  Administered 2016-11-07 – 2016-11-10 (×8): 2.25 g via INTRAVENOUS
  Filled 2016-11-07 (×12): qty 2.25

## 2016-11-07 MED ORDER — ONDANSETRON HCL 4 MG PO TABS: 4.0000 mg | ORAL_TABLET | Freq: Four times a day (QID) | ORAL | Status: DC | PRN

## 2016-11-07 MED ORDER — SODIUM CHLORIDE 0.9 % IV SOLN
1250.0000 mg | Freq: Once | INTRAVENOUS | Status: AC
  Administered 2016-11-07: 1250 mg via INTRAVENOUS
  Filled 2016-11-07: qty 1250

## 2016-11-07 MED ORDER — LORATADINE 10 MG PO TABS
10.0000 mg | ORAL_TABLET | Freq: Every day | ORAL | Status: DC
Start: 2016-11-07 — End: 2016-11-12
  Administered 2016-11-07 – 2016-11-12 (×6): 10 mg via ORAL
  Filled 2016-11-07 (×6): qty 1

## 2016-11-07 MED ORDER — ACETAMINOPHEN 650 MG RE SUPP: 650.0000 mg | Freq: Four times a day (QID) | RECTAL | Status: DC | PRN

## 2016-11-07 MED ORDER — PANTOPRAZOLE SODIUM 40 MG PO TBEC
40.0000 mg | DELAYED_RELEASE_TABLET | Freq: Every evening | ORAL | Status: DC
Start: 2016-11-07 — End: 2016-11-12
  Administered 2016-11-07 – 2016-11-11 (×5): 40 mg via ORAL
  Filled 2016-11-07 (×5): qty 1

## 2016-11-07 MED ORDER — ACETAMINOPHEN 325 MG PO TABS
650.0000 mg | ORAL_TABLET | Freq: Once | ORAL | Status: AC
  Administered 2016-11-07: 650 mg via ORAL
  Filled 2016-11-07: qty 2

## 2016-11-07 MED ORDER — VANCOMYCIN HCL IN DEXTROSE 1-5 GM/200ML-% IV SOLN: 1000.0000 mg | Freq: Once | INTRAVENOUS | Status: DC

## 2016-11-07 MED ORDER — SODIUM CHLORIDE 0.9 % IV BOLUS (SEPSIS)
1000.0000 mL | Freq: Once | INTRAVENOUS | Status: AC
  Administered 2016-11-07: 1000 mL via INTRAVENOUS

## 2016-11-07 MED ORDER — SIMVASTATIN 20 MG PO TABS
20.0000 mg | ORAL_TABLET | Freq: Every day | ORAL | Status: DC
  Administered 2016-11-07 – 2016-11-11 (×5): 20 mg via ORAL
  Filled 2016-11-07 (×5): qty 1

## 2016-11-07 MED ORDER — SODIUM POLYSTYRENE SULFONATE 15 GM/60ML PO SUSP
30.0000 g | Freq: Once | ORAL | Status: AC
  Administered 2016-11-07: 30 g via ORAL
  Filled 2016-11-07: qty 120

## 2016-11-07 MED ORDER — ENSURE ENLIVE PO LIQD
237.0000 mL | Freq: Two times a day (BID) | ORAL | Status: DC
  Administered 2016-11-08 – 2016-11-12 (×7): 237 mL via ORAL

## 2016-11-07 MED ORDER — VANCOMYCIN HCL IN DEXTROSE 1-5 GM/200ML-% IV SOLN: 1000.0000 mg | INTRAVENOUS | Status: DC

## 2016-11-07 MED ORDER — ONDANSETRON HCL 4 MG/2ML IJ SOLN: 4.0000 mg | Freq: Four times a day (QID) | INTRAMUSCULAR | Status: DC | PRN

## 2016-11-07 MED ORDER — DONEPEZIL HCL 5 MG PO TABS
5.0000 mg | ORAL_TABLET | Freq: Every day | ORAL | Status: DC
  Administered 2016-11-07 – 2016-11-12 (×6): 5 mg via ORAL
  Filled 2016-11-07 (×6): qty 1

## 2016-11-07 MED ORDER — LACTATED RINGERS IV SOLN
INTRAVENOUS | Status: DC
  Administered 2016-11-07 – 2016-11-08 (×2): via INTRAVENOUS
  Administered 2016-11-08 – 2016-11-10 (×3): 125 mL/h via INTRAVENOUS
  Administered 2016-11-12: 06:00:00 via INTRAVENOUS

## 2016-11-07 NOTE — ED Notes (Signed)
Pt sees Dr. Tommi Rumps with [facility] in [place]

## 2016-11-07 NOTE — ED Notes (Signed)
Attempted foley multiple times and coude x1 without success.  EDP is aware, pt has voided since with dark bloody urine.

## 2016-11-07 NOTE — H&P (Signed)
History and Physical    Lance Grant [id number] DOB: 1921/05/24 DOA: 11/07/2016  PCP: Maggie Font, MD Consultants:  Junious Silk - urology ([facility], [place]) Patient coming from: home - lives witgh wife, has caregiver to help; NOK: daughter, [phone]; son - [phone]  Chief Complaint: thigh pain s/p fall  HPI: Lance Grant is a 83 y.o. male with medical history significant of renal mass; PE (2007); PVD; HTN; HLD; COPD; CKD; and chronic anticoagulation presenting after a fall.  About midnight last night, he got to go to the bathroom and he tripped and fell.  He didn't feel bad and didn't think he broke anything.  Caregiver couldn't get him up but she and son were able to get him up and back to bed.  He didn't think he needed to go to the ER.  This AM, drank Boost and water but didn't eat.  Unable to stand up.  T 101 at 1pm.  Right mid-thigh pain.  No URI symptoms.  No dysuria.  He was able to void and give a specimen in the ER and Dr. Jeanell Sparrow was able to place a foley.  Per family: Saw urology on Tuesday.  H/o hematuria, recent recurrence.  Looks like gross hematuria but they weren't overly concerned.  If he started having clots or is unable to void, he needs to go back to urology.  Seeing urology for stable mass on kidney.  H/o seeing them but recurrent hematuria so he went Tuesday.  Kidneys functioning at 20% and has cyst on one of them.  Possible surgery, radiation - but they don't think he could handle that.  Plan was for repeat blood work next week.  Patient declined cystoscopy at that appointment.  I spoke with urology, Dr. Alyson Ingles.  He was able to pull up the patient's record to review it. He reports that the patient has a large left renal mass with persistent bleeding from the mass.  The patient has declined evaluation and treatment of the mass.  Urology would likely only need to be consulted if the patient was having active retention.  ED Course: Per Dr. Jeanell Sparrow: 1- fever- ?etiology-  patient receiving abx per sepsis protocol , cxr without infiltrate, urine pending. Lactic normal  2- acute on chronic renal failure- fluids ensuing  3- hyperkalemia- iv fluids/kayexalate  4- anemia  5- chronic anticoagulation  Discussed with Dr. Lorin Mercy and plan admission to telemetry bed. Nursing staff unable to pass Foley. I will try, and if unable, plan consult to urology   Review of Systems: As per HPI; otherwise 10 point review of systems reviewed and negative. Family also notes dementia.  Ambulatory Status:  Ambulates with a cane  Past Medical History:  Diagnosis Date  . Arteriosclerotic cardiovascular disease (ASCVD)    CABG 10/2001 nl EF  . Chronic anticoagulation    Managed by [facility]  . Chronic kidney disease, stage III (moderate)    Creatinine-1.9 08/2005, 1.5 02/2007, 1.7 12/2007, 1.7 06/2008  . COPD (chronic obstructive pulmonary disease) ([place])   . GERD (gastroesophageal reflux disease)   . Hyperlipidemia   . Hypertension   . Leg swelling    chronic  . Peripheral vascular disease ([place])   . Pleural effusion   . Pulmonary embolism [facility]) 2007   2007  . Seminal vesiculitis   . Sinus bradycardia    When on beta blockers    Past Surgical History:  Procedure Laterality Date  . CORONARY ARTERY BYPASS GRAFT  10/2001  . TRANSURETHRAL RESECTION  OF PROSTATE  07/2005    Social History   Social History  . Marital status: Married    Spouse name: N/A  . Number of children: 2  . Years of education: N/A   Occupational History  . Retired     [employer]  . [employer] for a total of 17 years; recently declined renominationb   Social History Main Topics  . Smoking status: Former Smoker    Packs/day: 1.00    Years: 10.00    Start date: 11/27/1946    Quit date: 11/05/1987  . Smokeless tobacco: Never Used  . Alcohol use No     Comment: 1 can beer/day  . Drug use: No  . Sexual activity: Not on file   Other Topics Concern   . Not on file   Social History Narrative   Married   2 children, 2 grandchildren   No regular exercise    Allergies  Allergen Reactions  . Erythromycin     History reviewed. No pertinent family history.  Prior to Admission medications   Medication Sig Start Date End Date Taking? Authorizing Provider  donepezil (ARICEPT) 5 MG tablet Take 5 mg by mouth daily.   Yes Historical Provider, MD  furosemide (LASIX) 20 MG tablet Take 1 tablet (20 mg total) by mouth daily. 01/02/16  Yes Herminio Commons, MD  lisinopril (PRINIVIL,ZESTRIL) 10 MG tablet Take 1 tablet (10 mg total) by mouth daily. 05/21/13  Yes Herminio Commons, MD  loratadine (CLARITIN) 10 MG tablet Take 10 mg by mouth daily.   Yes Historical Provider, MD  metoprolol tartrate (LOPRESSOR) 25 MG tablet Take 1 tablet by mouth daily. 10/27/16  Yes Historical Provider, MD  pantoprazole (PROTONIX) 40 MG tablet Take 40 mg by mouth every evening.    Yes Historical Provider, MD  simvastatin (ZOCOR) 20 MG tablet Take 1 tablet (20 mg total) by mouth at bedtime. 05/21/13  Yes Herminio Commons, MD  warfarin (COUMADIN) 2 MG tablet TAKE 2 TABLETS BY MOUTH DAILY EXCEPT 3 TABLETS ON [facility] 06/24/16  Yes Arnoldo Lenis, MD  nitroGLYCERIN (NITROLINGUAL) 0.4 MG/SPRAY spray Place 1 spray under the tongue every 5 (five) minutes as needed for chest pain. 05/21/13   Herminio Commons, MD    Physical Exam: Vitals:   11/07/16 1830 11/07/16 1900 11/07/16 1915 11/07/16 2001  BP: 116/83 (!) 105/47  100/90  Pulse: 72 75 75 69  Resp: 19 22 20 20   Temp:    98.9 F (37.2 C)  TempSrc:    Oral  SpO2: 98% 95% 99% 99%  Weight:    65.1 kg (143 lb 8.3 oz)  Height:    5\' 9"  (1.753 m)     General: Appears calm and comfortable and is NAD; patient is primarily sleeping throughout the evaluation but he is able to answer questions as needed Eyes: PERRL, EOMI, normal lids, iris ENT:  grossly normal hearing, lips & tongue, mmm Neck:  no LAD, masses  or thyromegaly Cardiovascular:  RRR, no m/r/g. No LE edema.  Respiratory:  CTA bilaterally, no w/r/r. Normal respiratory effort. Abdomen:  soft, ntnd, NABS Skin:  no rash or induration seen on limited exam Musculoskeletal:  grossly normal tone BUE/BLE, good ROM, no bony abnormality; mild tenderness with palpation of the right thigh Psychiatric:  grossly normal mood and affect, speech fluent and appropriate, AOx3 Neurologic:  CN 2-12 grossly intact, moves all extremities in coordinated fashion,  sensation intact  Labs on Admission: I have personally reviewed following labs and imaging studies  CBC:  Recent Labs Lab 11/07/16 1532  WBC 11.9*  NEUTROABS 10.1*  HGB 10.0*  HCT 28.8*  MCV 83.0  PLT 0000000   Basic Metabolic Panel:  Recent Labs Lab 11/07/16 1532  NA 132*  K 5.4*  CL 103  CO2 23  GLUCOSE 106*  BUN 67*  CREATININE 4.34*  CALCIUM 9.3   GFR: Estimated Creatinine Clearance: 9.8 mL/min (by C-G formula based on SCr of 4.34 mg/dL (H)). Liver Function Tests:  Recent Labs Lab 11/07/16 1532  AST 65*  ALT 33  ALKPHOS 46  BILITOT 0.5  PROT 6.6  ALBUMIN 3.2*   No results for input(s): LIPASE, AMYLASE in the last 168 hours. No results for input(s): AMMONIA in the last 168 hours. Coagulation Profile:  Recent Labs Lab 11/07/16 1532  INR 2.98   Cardiac Enzymes: No results for input(s): CKTOTAL, CKMB, CKMBINDEX, TROPONINI in the last 168 hours. BNP (last 3 results) No results for input(s): PROBNP in the last 8760 hours. HbA1C: No results for input(s): HGBA1C in the last 72 hours. CBG: No results for input(s): GLUCAP in the last 168 hours. Lipid Profile: No results for input(s): CHOL, HDL, LDLCALC, TRIG, CHOLHDL, LDLDIRECT in the last 72 hours. Thyroid Function Tests: No results for input(s): TSH, T4TOTAL, FREET4, T3FREE, THYROIDAB in the last 72 hours. Anemia Panel: No results for input(s): VITAMINB12, FOLATE, FERRITIN, TIBC, IRON, RETICCTPCT in the last 72  hours. Urine analysis:    Component Value Date/Time   COLORURINE RED (A) 11/07/2016 1530   APPEARANCEUR CLEAR 11/07/2016 1530   APPEARANCEUR Cloudy (A) 04/29/2016 1216   LABSPEC 1.015 11/07/2016 1530   PHURINE 6.5 11/07/2016 1530   GLUCOSEU 250 (A) 11/07/2016 1530   HGBUR LARGE (A) 11/07/2016 1530   BILIRUBINUR NEGATIVE 11/07/2016 1530   BILIRUBINUR Negative 04/29/2016 1216   KETONESUR TRACE (A) 11/07/2016 1530   PROTEINUR >300 (A) 11/07/2016 1530   NITRITE POSITIVE (A) 11/07/2016 1530   LEUKOCYTESUR MODERATE (A) 11/07/2016 1530   LEUKOCYTESUR 1+ (A) 04/29/2016 1216    Creatinine Clearance: Estimated Creatinine Clearance: 9.8 mL/min (by C-G formula based on SCr of 4.34 mg/dL (H)).  Sepsis Labs: @LABRCNTIP (procalcitonin:4,lacticidven:4) ) Recent Results (from the past 240 hour(s))  Blood Culture (routine x 2)     Status: None (Preliminary result)   Collection Time: 11/07/16  3:40 PM  Result Value Ref Range Status   Specimen Description BLOOD LEFT HAND  Final   Special Requests BOTTLES DRAWN AEROBIC ONLY 4CC AEB  Final   Culture PENDING  Incomplete   Report Status PENDING  Incomplete  Blood Culture (routine x 2)     Status: None (Preliminary result)   Collection Time: 11/07/16  3:50 PM  Result Value Ref Range Status   Specimen Description BLOOD RIGHT ARM  Final   Special Requests BOTTLES DRAWN AEROBIC AND ANAEROBIC [facility] EACH  Final   Culture PENDING  Incomplete   Report Status PENDING  Incomplete     Radiological Exams on Admission: Dg Chest Port 1 View  Result Date: 11/07/2016 CLINICAL DATA:  Fever.  Recent fall EXAM: PORTABLE CHEST 1 VIEW COMPARISON:  April 10, 2013 FINDINGS: There is no edema or consolidation. Heart is upper normal in size with pulmonary vascularity within normal limits. No adenopathy. Patient is status post internal mammary bypass grafting. No pneumothorax. No bone lesions. IMPRESSION: No edema or consolidation.  No evident pneumothorax. Electronically  Signed  By: Lowella Grip III M.D.   On: 11/07/2016 16:29   Dg Femur Min 2 Views Right  Result Date: 11/07/2016 CLINICAL DATA:  Right lower extremity pain status post fall. EXAM: RIGHT FEMUR 2 VIEWS COMPARISON:  None. FINDINGS: There is no evidence of fracture or other focal bone lesions. There are 3 compartment osteoarthritic changes of the right knee, with a small right suprapatellar joint effusion. Moderate osteoarthritic changes of the right hip joint also noted. Vascular calcifications within the soft tissues. IMPRESSION: No acute fracture or dislocation identified about the right femur. Osteoarthritic changes of the right hip right knee. Electronically Signed   By: Fidela Salisbury M.D.   On: 11/07/2016 16:38    EKG: Independently reviewed.  NSR with rate 71; nonspecific ST changes with no evidence of acute ischemia  Assessment/Plan Principal Problem:   Sepsis ([place]) Active Problems:   Chronic kidney disease, stage III (moderate)   Chronic anticoagulation   Acute renal failure (H[facility])   UTI (urinary tract infection)   Mild malnutrition ([facility])   Anemia   Fall   Sepsis from a urinary source -UA moderate LE, positive nitrite, many bacteria, large Hgb, TNTC RBC, 6-30 WBC -WBC 11.9 -Elevated WBC count, fever with normal lactate and borderline hypotension -Sepsis protocol initiated in ER -Suspect urinary source  - possible retention due to persistent hematuria but he is able to make urine -Blood and urine cultures pending -Will admit with telemetry and continue to monitor -Treat with IV Zosyn and Vanc for now, if improved tomorrow can likely taper therapy to Rocephin (or would also be reasonable to wait until culture results come back) -Will trend lactate  -He is quite elderly and this increases his mortality risk -Consider palliative care consult  Acute renal failure on CKD -Cr 4.34 (baseline 2.3) -As above, suspect that this is due to sepsis and volume deficiency and so  should improve with IVF -However, given persistent hematuria from bleeding renal mass, clots may lead to urinary retention and other issues  -Will trend creatinine -Consider urology consult if patient does not improve or becomes unable to void  Chronic anticoagulation -This appears to be from a PE in [id number], uncertain if there was additional thromboembolic disease -If this was a single PE, anticoagulation should be discontinued -If there were additional clots, a serious risk:benefit discussion likely needs to be held -The patient is anemic (Hgb 10) and we don't have a prior Hgb since 2014 so his baseline is unknown (suspect this is reported in the urology system so we could find it for comparison at some point) - this is presumed to be acute or subacute blood loss anemia -He has a known bleeding renal mass that is not being treated at the patient's request -As such, his long-term bleeding risk may be greater than his risk from thromboembolic disease -For now, his INR is at the upper limit of the goal range (INR 2.98); will hold for tonight and recheck INR in the AM  -Will defer to day team about ongoing use of anticoagulation in this patient  Mild malnutrition -Consider nutrition evaluation  Fall with thigh pain -Patient with mechanical fall -Does not appear to have fracture -He is likely to have significant MSK pain for another day or two -Falls also increase his bleeding risk -There is no large hematoma obviously developing now, but if his pain is ongoing and/or the thigh exam becomes abnormal, he may need an ultrasound for further evaluation -Will request PT consult   DVT  prophylaxis: Coumadin Code Status: DNR - confirmed with patient/family Family Communication: Daughter and son were both present for discussion Disposition Plan: Home once clinically improved Consults called: PT  Admission status: Admit - It is my clinical opinion that admission to INPATIENT is reasonable and  necessary because this patient will require at least 2 midnights in the hospital to treat this condition based on the medical complexity of the problems presented.  Given the aforementioned information, the predictability of an adverse outcome is felt to be significant.    Karmen Bongo MD [facility]  If 7PM-7AM, please contact night-coverage [email or web address] Password [email or web address]  11/07/2016, 8:25 PM

## 2016-11-07 NOTE — ED Provider Notes (Signed)
[facility] DEPT Provider Note   CSN: [id number] Arrival date & time: 11/07/16  1527     History   Chief Complaint Chief Complaint  Patient presents with  . Fall  . Leg Pain    HPI Lance Grant is a 83 y.o. male.  HPI  83 year old man transported here via EMS with reports that he fell last night. He was helped from the fall to bed by his family. EMS reports he was unable to get up today and is complaining of pain in his right thigh. Patient remembers falling but is unable to give me details. He does not think they struck his head. He thinks that he is on blood thinners. He states he has pain in his right upper leg having his right mid thigh with his hand as he tells me this. He is unable to give me any other specifics around the fall. He denies current headache and pain, neck pain, chest pain, nausea, vomiting, or diarrhea. He does endorse that he has been urinating more frequently than usual. He felt that he may have stooled on himself today. He states that he thinks he is taking blood thinner he thinks he may have a GI bleed in the past. Primary care is Dr. Berdine Addison.  Past Medical History:  Diagnosis Date  . Arteriosclerotic cardiovascular disease (ASCVD)    CABG 10/2001 nl EF  . Chronic anticoagulation    Managed by [facility]  . Chronic kidney disease, stage III (moderate)    Creatinine-1.9 08/2005, 1.5 02/2007, 1.7 12/2007, 1.7 06/2008  . COPD (chronic obstructive pulmonary disease) ([place])   . GERD (gastroesophageal reflux disease)   . Hyperlipidemia   . Hypertension   . Leg swelling    chronic  . Peripheral vascular disease ([place])   . Pleural effusion   . Pulmonary embolism [facility]) 2007   2007  . Seminal vesiculitis   . Sinus bradycardia    When on beta blockers    Patient Active Problem List   Diagnosis Date Noted  . Lower extremity edema 04/03/2016  . Knee pain, right 12/27/2015  . Hyperkeratosis of nail 12/27/2015  . Encounter for therapeutic drug monitoring  12/01/2013  . Hypertension   . COPD (chronic obstructive pulmonary disease) ([place])   . Chronic kidney disease, stage III (moderate)   . GERD (gastroesophageal reflux disease)   . Hyperlipidemia   . Arteriosclerotic cardiovascular disease (ASCVD)   . Pulmonary embolism ([place])   . Chronic anticoagulation     Past Surgical History:  Procedure Laterality Date  . CORONARY ARTERY BYPASS GRAFT  10/2001  . TRANSURETHRAL RESECTION OF PROSTATE  07/2005       Home Medications    Prior to Admission medications   Medication Sig Start Date End Date Taking? Authorizing Provider  atenolol (TENORMIN) 25 MG tablet Take 1 tablet (25 mg total) by mouth daily. 05/21/13   Herminio Commons, MD  donepezil (ARICEPT) 5 MG tablet Take 5 mg by mouth daily.    Historical Provider, MD  furosemide (LASIX) 20 MG tablet Take 1 tablet (20 mg total) by mouth daily. 01/02/16   Herminio Commons, MD  lisinopril (PRINIVIL,ZESTRIL) 10 MG tablet Take 1 tablet (10 mg total) by mouth daily. 05/21/13   Herminio Commons, MD  loratadine (CLARITIN) 10 MG tablet Take 10 mg by mouth daily.    Historical Provider, MD  nitroGLYCERIN (NITROLINGUAL) 0.4 MG/SPRAY spray Place 1 spray under the tongue every 5 (five) minutes as needed for chest pain.  05/21/13   Herminio Commons, MD  pantoprazole (PROTONIX) 40 MG tablet Take 40 mg by mouth every evening.     Historical Provider, MD  simvastatin (ZOCOR) 20 MG tablet Take 1 tablet (20 mg total) by mouth at bedtime. 05/21/13   Herminio Commons, MD  warfarin (COUMADIN) 2 MG tablet TAKE 2 TABLETS BY MOUTH DAILY EXCEPT 3 TABLETS ON [facility] 06/24/16   Arnoldo Lenis, MD    Family History No family history on file.  Social History Social History  Substance Use Topics  . Smoking status: Former Smoker    Packs/day: 1.00    Years: 10.00    Start date: 11/27/1946    Quit date: 11/05/1987  . Smokeless tobacco: Never Used  . Alcohol use 4.2 oz/week    7 Cans of beer per week      Comment: 1 can beer/day     Allergies   Erythromycin   Review of Systems Review of Systems  All other systems reviewed and are negative.    Physical Exam Updated Vital Signs BP 124/91   Pulse 77   Temp 102.7 F (39.3 C)   Resp (!) 30   Ht 5\' 9"  (1.753 m)   Wt 65.8 kg   SpO2 98%   BMI 21.41 kg/m   Physical Exam  Constitutional: He is oriented to person, place, and time. He appears well-developed and well-nourished. No distress.  Patient is warm to touch  HENT:  Head: Normocephalic and atraumatic.  Right Ear: External ear normal.  Left Ear: External ear normal.  Nose: Nose normal.  Mouth/Throat: Oropharynx is clear and moist.  Neck: Normal range of motion.  Cardiovascular: Normal rate.   Pulmonary/Chest: Effort normal and breath sounds normal.  Abdominal: Soft. Bowel sounds are normal.  Genitourinary:  Genitourinary Comments: Dark stool noted perirectal area and on underwear  Musculoskeletal: Normal range of motion.  Mild tenderness palpation mid right thigh laterally with no tenderness palpation of the right hip there is pain with movement of the right femur  Neurological: He is alert and oriented to person, place, and time. No cranial nerve deficit.  Skin: Skin is warm. Capillary refill takes less than 2 seconds.  Patient has yellowish cast to skin  Psychiatric: He has a normal mood and affect.  Nursing note and vitals reviewed.    ED Treatments / Results  Labs (all labs ordered are listed, but only abnormal results are displayed) Labs Reviewed  CULTURE, BLOOD (ROUTINE X 2)  CULTURE, BLOOD (ROUTINE X 2)  URINE CULTURE  COMPREHENSIVE METABOLIC PANEL  CBC WITH DIFFERENTIAL/PLATELET  URINALYSIS, ROUTINE W REFLEX MICROSCOPIC  PROTIME-INR  POC OCCULT BLOOD, ED  I-STAT CG4 LACTIC ACID, ED    EKG  EKG Interpretation  Date/Time:  Thursday November 07 2016 15:38:11 EST Ventricular Rate:  71 PR Interval:    QRS Duration: 95 QT Interval:  372 QTC  Calculation: 405 R Axis:   32 Text Interpretation:  Normal sinus rhythm No significant change since last tracing Nonspecific T abnormalities, lateral leads Confirmed by Risa Auman MD, Andee Poles [phone]) on 11/07/2016 5:02:05 PM       Radiology Dg Chest Port 1 View  Result Date: 11/07/2016 CLINICAL DATA:  Fever.  Recent fall EXAM: PORTABLE CHEST 1 VIEW COMPARISON:  April 10, 2013 FINDINGS: There is no edema or consolidation. Heart is upper normal in size with pulmonary vascularity within normal limits. No adenopathy. Patient is status post internal mammary bypass grafting. No pneumothorax. No bone lesions. IMPRESSION: No  edema or consolidation.  No evident pneumothorax. Electronically Signed   By: Lowella Grip III M.D.   On: 11/07/2016 16:29   Dg Femur Min 2 Views Right  Result Date: 11/07/2016 CLINICAL DATA:  Right lower extremity pain status post fall. EXAM: RIGHT FEMUR 2 VIEWS COMPARISON:  None. FINDINGS: There is no evidence of fracture or other focal bone lesions. There are 3 compartment osteoarthritic changes of the right knee, with a small right suprapatellar joint effusion. Moderate osteoarthritic changes of the right hip joint also noted. Vascular calcifications within the soft tissues. IMPRESSION: No acute fracture or dislocation identified about the right femur. Osteoarthritic changes of the right hip right knee. Electronically Signed   By: Fidela Salisbury M.D.   On: 11/07/2016 16:38    Procedures BLADDER CATHETERIZATION Date/Time: 11/07/2016 6:08 PM Performed by: Pattricia Boss Authorized by: Pattricia Boss   Consent:    Consent obtained:  Verbal   Consent given by:  Patient   Alternatives discussed:  Referral Pre-procedure details:    Procedure purpose:  Therapeutic   Preparation: Patient was prepped and draped in usual sterile fashion   Anesthesia (see MAR for exact dosages):    Anesthesia method:  None Procedure details:    Provider performed due to:  Nurse unable to complete    Catheter insertion:  Temporary indwelling   Catheter type:  Coude   Catheter size:  14 Fr   Bladder irrigation: no     Number of attempts:  1   Urine characteristics:  Bloody Post-procedure details:    Patient tolerance of procedure:  Tolerated well, no immediate complications   (including critical care time)  Medications Ordered in ED Medications  sodium chloride 0.9 % bolus 1,000 mL (not administered)    And  sodium chloride 0.9 % bolus 1,000 mL (not administered)  piperacillin-tazobactam (ZOSYN) IVPB 3.375 g (not administered)  acetaminophen (TYLENOL) tablet 650 mg (not administered)  vancomycin (VANCOCIN) 1,250 mg in sodium chloride 0.9 % 250 mL IVPB (not administered)     Initial Impression / Assessment and Plan / ED Course  I have reviewed the triage vital signs and the nursing notes.  Pertinent labs & imaging results that were available during my care of the patient were reviewed by me and considered in my medical decision making (see chart for details).  Clinical Course as of Nov 07 1654  Thu Nov 07, 2016  1652 New onset renal failuer with creatinine at 4.33- last  Creatinine: (!) 4.34 [DR]  1653 Last creatinine 1. 65 03/20/15  [DR]  1653 Potassium mildly elevated ekg without changes c.w. Hyperkalemia.  Plan iv fluids as per sepsis protocol.  Potassium: (!) 5.4 [DR]  1654 Inr therapeutic.  Hemocult negative.  INR: 2.98 [DR]  1654 Anemia noted.  Hemoglobin: (!) 10.0 [DR]  1655 Last hgb 12   [DR]    Clinical Course User Index [DR] Pattricia Boss, MD   1- fever- ?etiology- patient receiving abx per sepsis protocol , cxr without infiltrate, urine pending. Lactic normal 2- acute on chronic renal failure- fluids ensuing 3- hyperkalemia- iv fluids/kayexalate 4- anemia 5- chronic anticoagulation  Discussed with Dr. Lorin Mercy and plan admission to telemetry bed. Nursing staff unable to pass Foley. I will try, and if unable, plan consult to urology CRITICAL CARE Performed by:  Simaya Lumadue S Total critical care time: 60 minutes Critical care time was exclusive of separately billable procedures and treating other patients. Critical care was necessary to treat or prevent imminent or life-threatening deterioration.  Critical care was time spent personally by me on the following activities: development of treatment plan with patient and/or surrogate as well as nursing, discussions with consultants, evaluation of patient's response to treatment, examination of patient, obtaining history from patient or surrogate, ordering and performing treatments and interventions, ordering and review of laboratory studies, ordering and review of radiographic studies, pulse oximetry and re-evaluation of patient's condition.  Final Clinical Impressions(s) / ED Diagnoses   Final diagnoses:  Fall, initial encounter  Acute renal failure superimposed on chronic kidney disease, unspecified CKD stage, unspecified acute renal failure type ([place])  Hyperkalemia  Sepsis, due to unspecified organism [facility])    New Prescriptions New Prescriptions   No medications on file     Pattricia Boss, MD 11/07/16 [id number]

## 2016-11-07 NOTE — ED Notes (Signed)
Admission temperature was done rectally

## 2016-11-07 NOTE — ED Notes (Signed)
Alecia Lemming NT and Deanna Mabe NT attempted 16 foley cath and unable to advance cath.  Eston Esters R.N. Attempted coude cath. Which was also unsuccessful.

## 2016-11-07 NOTE — ED Notes (Signed)
Family at bedside reports he fell last night and fever started this morning

## 2016-11-07 NOTE — Progress Notes (Signed)
Pharmacy Antibiotic Note  Lance Grant is a 83 y.o. male admitted on 11/07/2016 with sepsis.  Pharmacy has been consulted for Vancomycin and zosyn dosing.  Plan: Vancomycin 1250mg  loading dose then 1000mg  IV every 48 hours.  Goal trough 15-20 mcg/mL. Zosyn 2.25gm IV q8h F/U cxs and clinical progress Monitor V/S and labs Levels as indicated  Height: 5\' 9"  (175.3 cm) Weight: 145 lb (65.8 kg) IBW/kg (Calculated) : 70.7  Temp (24hrs), Avg:101.2 F (38.4 C), Min:99.5 F (37.5 C), Max:102.7 F (39.3 C)   Recent Labs Lab 11/07/16 1532 11/07/16 1542  WBC 11.9*  --   CREATININE 4.34*  --   LATICACIDVEN  --  1.15    Estimated Creatinine Clearance: 9.9 mL/min (by C-G formula based on SCr of 4.34 mg/dL (H)).    Allergies  Allergen Reactions  . Erythromycin     Antimicrobials this admission: Vancomycin 1/4 >>  Zosyn 1/4 >>   Dose adjustments this admission: n/a  Microbiology results: 1/4 BCx: pending 1/4 UCx: pending  Thank you for allowing pharmacy to be a part of this patient's care. Isac Sarna, BS [place], [place] Clinical Pharmacist Pager [phone] 11/07/2016 6:37 PM

## 2016-11-07 NOTE — ED Notes (Signed)
EDP at bedside  

## 2016-11-07 NOTE — ED Notes (Signed)
One set done from IV and another set done by lab

## 2016-11-07 NOTE — Telephone Encounter (Signed)
1/4 Spoke with son.  He cancelled pt's INR appt yesterday because it was checked by urologist who said it was in range.  Son had taken pt there due to blood in urine.  Pt has a cyst on kidney.  Son says his dad fell out of the bed last night and has a low grade temp today.  They have called EMS to take him to [facility].   Will see pt after hospitalization.

## 2016-11-07 NOTE — ED Triage Notes (Signed)
Pt brought in by EMS. Pt fell last night and complaining of left thigh pain

## 2016-11-07 NOTE — Progress Notes (Signed)
ANTICOAGULATION CONSULT NOTE - Initial Consult  Pharmacy Consult for Coumadin Indication: pulmonary embolus  Allergies  Allergen Reactions  . Erythromycin     Patient Measurements: Height: 5\' 9"  (175.3 cm) Weight: 143 lb 8.3 oz (65.1 kg) IBW/kg (Calculated) : 70.7  Vital Signs: Temp: 98.9 F (37.2 C) (01/04 2001) Temp Source: Oral (01/04 2001) BP: 100/90 (01/04 2001) Pulse Rate: 69 (01/04 2001)  Labs:  Recent Labs  11/07/16 1532  HGB 10.0*  HCT 28.8*  PLT 153  LABPROT 31.6*  INR 2.98  CREATININE 4.34*    Estimated Creatinine Clearance: 9.8 mL/min (by C-G formula based on SCr of 4.34 mg/dL (H)).   Medical History: Past Medical History:  Diagnosis Date  . Arteriosclerotic cardiovascular disease (ASCVD)    CABG 10/2001 nl EF  . Chronic anticoagulation    Managed by [facility]  . Chronic kidney disease, stage III (moderate)    Creatinine-1.9 08/2005, 1.5 02/2007, 1.7 12/2007, 1.7 06/2008  . COPD (chronic obstructive pulmonary disease) ([place])   . GERD (gastroesophageal reflux disease)   . Hyperlipidemia   . Hypertension   . Leg swelling    chronic  . Peripheral vascular disease ([place])   . Pleural effusion   . Pulmonary embolism [facility]) 2007   2007  . Seminal vesiculitis   . Sinus bradycardia    When on beta blockers    Medications:  Prescriptions Prior to Admission  Medication Sig Dispense Refill Last Dose  . donepezil (ARICEPT) 5 MG tablet Take 5 mg by mouth daily.   11/07/2016 at Unknown time  . furosemide (LASIX) 20 MG tablet Take 1 tablet (20 mg total) by mouth daily. 90 tablet 3 11/07/2016 at Unknown time  . lisinopril (PRINIVIL,ZESTRIL) 10 MG tablet Take 1 tablet (10 mg total) by mouth daily. 90 tablet 1 11/07/2016 at Unknown time  . loratadine (CLARITIN) 10 MG tablet Take 10 mg by mouth daily.   11/07/2016 at Unknown time  . metoprolol tartrate (LOPRESSOR) 25 MG tablet Take 1 tablet by mouth daily.  1 11/07/2016 at Unknown time  . pantoprazole (PROTONIX) 40 MG  tablet Take 40 mg by mouth every evening.    11/06/2016 at Unknown time  . simvastatin (ZOCOR) 20 MG tablet Take 1 tablet (20 mg total) by mouth at bedtime. 90 tablet 1 11/06/2016 at Unknown time  . warfarin (COUMADIN) 2 MG tablet TAKE 2 TABLETS BY MOUTH DAILY EXCEPT 3 TABLETS ON WEDNESDAY 90 tablet 3 11/07/2016 at 1000  . nitroGLYCERIN (NITROLINGUAL) 0.4 MG/SPRAY spray Place 1 spray under the tongue every 5 (five) minutes as needed for chest pain. 12 g 12 unknown    Assessment: 83 yo male presents to ED with thigh pain s/p fall He is chronically anticoagulated for a PE from 2007.  He has a h/o hematuria and went to see urology this week, but not overly concerned. He has a stable mass on the kidney. Discussed coumadin administration with Dr. Lorin Mercy and will hold off on dose today. Will follow up with labs in AM and plan for treatment once patient more stabilized.  Goal of Therapy:  INR 2-3 Monitor platelets by anticoagulation protocol: Yes   Plan:  No Coumadin today Daily PT-INR Monitor for bleeding  Isac Sarna, BS [place], BCPS Clinical Pharmacist Pager [phone] 11/07/2016,8:21 PM

## 2016-11-08 ENCOUNTER — Telehealth: Payer: Self-pay | Admitting: *Deleted

## 2016-11-08 DIAGNOSIS — N189 Chronic kidney disease, unspecified: Secondary | ICD-10-CM

## 2016-11-08 DIAGNOSIS — N179 Acute kidney failure, unspecified: Secondary | ICD-10-CM

## 2016-11-08 LAB — CBC
HEMATOCRIT: 26.1 % — AB (ref 39.0–52.0)
HEMOGLOBIN: 9 g/dL — AB (ref 13.0–17.0)
MCH: 28.7 pg (ref 26.0–34.0)
MCHC: 34.5 g/dL (ref 30.0–36.0)
MCV: 83.1 fL (ref 78.0–100.0)
Platelets: 142 10*3/uL — ABNORMAL LOW (ref 150–400)
RBC: 3.14 MIL/uL — AB (ref 4.22–5.81)
RDW: 15.6 % — ABNORMAL HIGH (ref 11.5–15.5)
WBC: 11.6 10*3/uL — AB (ref 4.0–10.5)

## 2016-11-08 LAB — BLOOD CULTURE ID PANEL (REFLEXED)
ACINETOBACTER BAUMANNII: NOT DETECTED
CANDIDA ALBICANS: NOT DETECTED
CANDIDA GLABRATA: NOT DETECTED
Candida krusei: NOT DETECTED
Candida parapsilosis: NOT DETECTED
Candida tropicalis: NOT DETECTED
Carbapenem resistance: NOT DETECTED
ENTEROBACTER CLOACAE COMPLEX: NOT DETECTED
ENTEROCOCCUS SPECIES: NOT DETECTED
ESCHERICHIA COLI: NOT DETECTED
Enterobacteriaceae species: DETECTED — AB
Haemophilus influenzae: NOT DETECTED
Klebsiella oxytoca: NOT DETECTED
Klebsiella pneumoniae: DETECTED — AB
LISTERIA MONOCYTOGENES: NOT DETECTED
NEISSERIA MENINGITIDIS: NOT DETECTED
Proteus species: NOT DETECTED
Pseudomonas aeruginosa: NOT DETECTED
STAPHYLOCOCCUS SPECIES: NOT DETECTED
STREPTOCOCCUS AGALACTIAE: NOT DETECTED
STREPTOCOCCUS PNEUMONIAE: NOT DETECTED
STREPTOCOCCUS SPECIES: NOT DETECTED
Serratia marcescens: NOT DETECTED
Staphylococcus aureus (BCID): NOT DETECTED
Streptococcus pyogenes: NOT DETECTED

## 2016-11-08 LAB — BASIC METABOLIC PANEL
ANION GAP: 6 (ref 5–15)
BUN: 61 mg/dL — ABNORMAL HIGH (ref 6–20)
CO2: 24 mmol/L (ref 22–32)
Calcium: 8.6 mg/dL — ABNORMAL LOW (ref 8.9–10.3)
Chloride: 111 mmol/L (ref 101–111)
Creatinine, Ser: 3.73 mg/dL — ABNORMAL HIGH (ref 0.61–1.24)
GFR calc Af Amer: 15 mL/min — ABNORMAL LOW (ref >60)
GFR, EST NON AFRICAN AMERICAN: 13 mL/min — AB (ref >60)
Glucose, Bld: 134 mg/dL — ABNORMAL HIGH (ref 65–99)
POTASSIUM: 3.9 mmol/L (ref 3.5–5.1)
Sodium: 141 mmol/L (ref 135–145)

## 2016-11-08 LAB — PROTIME-INR
INR: 3.57
Prothrombin Time: 36.5 seconds — ABNORMAL HIGH (ref 11.4–15.2)

## 2016-11-08 MED ORDER — SODIUM CHLORIDE 0.9 % IV BOLUS (SEPSIS)
500.0000 mL | Freq: Once | INTRAVENOUS | Status: AC
  Administered 2016-11-08: 500 mL via INTRAVENOUS

## 2016-11-08 MED ORDER — OXYCODONE HCL 5 MG PO TABS
5.0000 mg | ORAL_TABLET | Freq: Four times a day (QID) | ORAL | Status: DC | PRN
  Administered 2016-11-08 – 2016-11-12 (×7): 5 mg via ORAL
  Filled 2016-11-08 (×7): qty 1

## 2016-11-08 NOTE — Progress Notes (Addendum)
PROGRESS NOTE    Lance Grant  [id number] DOB: 1921/04/20 DOA: 11/07/2016 PCP: Maggie Font, MD   Brief Narrative: 83 y.o. male with medical history significant of renal mass; PE (2007); PVD; HTN; HLD; COPD; CKD; and chronic anticoagulation presenting after a fall. As per H&P, discussed with urology, Dr. Alyson Ingles. He reports that the patient has a large left renal mass with persistent bleeding from the mass.  The patient has declined evaluation and treatment of the mass.  Urology would likely only need to be consulted if the patient was having active retention. Patient now with UTI, Klebsiella bacteremia.  Assessment & Plan:   Principal Problem:   Sepsis ([facility]) Active Problems:   Chronic kidney disease, stage III (moderate)   Chronic anticoagulation   Acute renal failure (HCC)   UTI (urinary tract infection)   Mild malnutrition (HCC)   Anemia   Fall  # Sepsis due to Klebsiella bacteremia likely related with urinary tract infection: -Both blood culture bottles growing gram-negative rod consistent with Klebsiella. Currently on IV Zosyn. We will discontinue vancomycin. Follow-up final culture results. Continue to monitor leukocytosis and fever car. -Continue IV fluid, Tylenol as needed -Patient has Foley catheter inserted in Er.  #Fall: Likely in the setting of infection. PT, OT and social worker evaluation. Likely benefit from rehabilitation on discharge. Discussed with the patient's son in detail. He agreed with the plan. -Had type pain on admission. X-ray with no fracture.  #Acute on chronic kidney disease: The last serum creatinine level was 1.7 in May 2016. Admitted with serum creatinine level of 4.3 likely in the setting of UTI and sepsis. Serum creatinine level trending down to 3.7 today. Continue to monitor. Avoid nephrotoxins. -Patient with renal mass and currently with hematuria. Patient was recently evaluated by a neurologist outpatient. I'm considering discontinue  anticoagulation.   #History of pulmonary embolism in 2017 and on chronic anticoagulation: Given hematuria,  Anemia and frequent falls that systemic anticoagulation might not be safe for this patient. I think the risk of bleeding outweighs the benefit. I discussed this with the patient's son in detail and he agreed with the plan. Discontinue Coumadin.  #Possible mild to moderate protein calorie malnutrition: Patient with decreased oral intake. He spoke with the patient's son. We will consult dietary. Continue with oral supplement and regular diet. Also discussed with the patient's nurse.  #Possible dementia without behavioral issue, likely Alzheimer's: Continue Aricept. Supportive care.  # Normocytic anemia likely multifactorial etiology including hematuria and chronic kidney disease. Hemoglobin 9 today. Monitor CBC.  DVT prophylaxis: On systemic anticoagulation with Coumadin and high INR today. SCD. Code Status: DO NOT RESUSCITATE Family Communication: Discussed with the patient's son over the phone. Disposition Plan: Likely discharge to nursing home in 1-2 days. Patient lives with his wife who is 48 years old.    Consultants:   None  Procedures: None IV Zosyn Antimicrobials:  Subjective: Patient was seen and examined at bedside. Denied pain, nausea, vomiting, chest pain or shortness of breath.   Objective: Vitals:   11/07/16 1915 11/07/16 2001 11/08/16 0700 11/08/16 1142  BP:  (!) 100/40 (!) 107/47   Pulse: 75 69 81   Resp: 20 20 19    Temp:  98.9 F (37.2 C) (!) 100.6 F (38.1 C) (!) 101.9 F (38.8 C)  TempSrc:  Oral Oral Oral  SpO2: 99% 99% 98%   Weight:  65.1 kg (143 lb 8.3 oz)    Height:  5\' 9"  (1.753 m)  Intake/Output Summary (Last 24 hours) at 11/08/16 1217 Last data filed at 11/08/16 0908  Gross per 24 hour  Intake          3370.92 ml  Output             1200 ml  Net          2170.92 ml   Filed Weights   11/07/16 1525 11/07/16 2001  Weight: 65.8 kg (145  lb) 65.1 kg (143 lb 8.3 oz)    Examination:  General exam: Appears calm and comfortable  Respiratory system: Clear to auscultation. Respiratory effort normal. No wheezing or crackle Cardiovascular system: S1 & S2 heard, RRR.  No pedal edema. Gastrointestinal system: Abdomen is nondistended, soft and nontender. Normal bowel sounds heard. Central nervous system: Alert Awake and oriented to hospital January and name. Extremities: Symmetric 5 x 5 power. Skin: No rashes, lesions or ulcers Psychiatry: Judgement and insight appear impaired     Data Reviewed: I have personally reviewed following labs and imaging studies  CBC:  Recent Labs Lab 11/07/16 1532 11/08/16 0411  WBC 11.9* 11.6*  NEUTROABS 10.1*  --   HGB 10.0* 9.0*  HCT 28.8* 26.1*  MCV 83.0 83.1  PLT 153 [id number]*   Basic Metabolic Panel:  Recent Labs Lab 11/07/16 1532 11/08/16 0411  NA 132* 141  K 5.4* 3.9  CL 103 111  CO2 23 24  GLUCOSE 106* 134*  BUN 67* 61*  CREATININE 4.34* 3.73*  CALCIUM 9.3 8.6*   GFR: Estimated Creatinine Clearance: 11.4 mL/min (by C-G formula based on SCr of 3.73 mg/dL (H)). Liver Function Tests:  Recent Labs Lab 11/07/16 1532  AST 65*  ALT 33  ALKPHOS 46  BILITOT 0.5  PROT 6.6  ALBUMIN 3.2*   No results for input(s): LIPASE, AMYLASE in the last 168 hours. No results for input(s): AMMONIA in the last 168 hours. Coagulation Profile:  Recent Labs Lab 11/07/16 1532 11/07/16 2142 11/08/16 0411  INR 2.98 3.33 3.57   Cardiac Enzymes: No results for input(s): CKTOTAL, CKMB, CKMBINDEX, TROPONINI in the last 168 hours. BNP (last 3 results) No results for input(s): PROBNP in the last 8760 hours. HbA1C: No results for input(s): HGBA1C in the last 72 hours. CBG: No results for input(s): GLUCAP in the last 168 hours. Lipid Profile: No results for input(s): CHOL, HDL, LDLCALC, TRIG, CHOLHDL, LDLDIRECT in the last 72 hours. Thyroid Function Tests: No results for input(s): TSH,  T4TOTAL, FREET4, T3FREE, THYROIDAB in the last 72 hours. Anemia Panel: No results for input(s): VITAMINB12, FOLATE, FERRITIN, TIBC, IRON, RETICCTPCT in the last 72 hours. Sepsis Labs:  Recent Labs Lab 11/07/16 1542 11/07/16 1908 11/07/16 2142  PROCALCITON  --   --  13.98  LATICACIDVEN 1.15 0.9 1.2    Recent Results (from the past 240 hour(s))  Blood Culture (routine x 2)     Status: None (Preliminary result)   Collection Time: 11/07/16  3:40 PM  Result Value Ref Range Status   Specimen Description BLOOD LEFT HAND  Final   Special Requests BOTTLES DRAWN AEROBIC ONLY 4CC AEB  Final   Culture  Setup Time   Final    GRAM NEGATIVE RODS Gram Stain Report Called to,Read Back By and Verified With: BULLINS L. AT 1018A ON [id number] BY THOMPSON S.    Culture PENDING  Incomplete   Report Status PENDING  Incomplete  Blood Culture (routine x 2)     Status: None (Preliminary result)   Collection Time: 11/07/16  3:50 PM  Result Value Ref Range Status   Specimen Description BLOOD RIGHT ARM  Final   Special Requests BOTTLES DRAWN AEROBIC AND ANAEROBIC 6CC EACH  Final   Culture  Setup Time   Final    GRAM NEGATIVE RODS IN BOTH AEROBIC AND ANAEROBIC BOTTLES Gram Stain Report Called to,Read Back By and Verified With: HANDY T AT 0527 ON [id number] BY FORSYTH K CRITICAL RESULT CALLED TO, READ BACK BY AND VERIFIED WITH: L SEAY,PHARMD AT 1035 11/08/16 BY L BENFIELD Performed at [facility]  Final   Report Status PENDING  Incomplete  Blood Culture ID Panel (Reflexed)     Status: Abnormal   Collection Time: 11/07/16  3:50 PM  Result Value Ref Range Status   Enterococcus species NOT DETECTED NOT DETECTED Final   Listeria monocytogenes NOT DETECTED NOT DETECTED Final   Staphylococcus species NOT DETECTED NOT DETECTED Final   Staphylococcus aureus NOT DETECTED NOT DETECTED Final   Streptococcus species NOT DETECTED NOT DETECTED Final   Streptococcus agalactiae NOT  DETECTED NOT DETECTED Final   Streptococcus pneumoniae NOT DETECTED NOT DETECTED Final   Streptococcus pyogenes NOT DETECTED NOT DETECTED Final   Acinetobacter baumannii NOT DETECTED NOT DETECTED Final   Enterobacteriaceae species DETECTED (A) NOT DETECTED Final    Comment: CRITICAL RESULT CALLED TO, READ BACK BY AND VERIFIED WITH: L. Seay Pharm.D. 10:35 11/08/16 ([email or web address])    Enterobacter cloacae complex NOT DETECTED NOT DETECTED Final   Escherichia coli NOT DETECTED NOT DETECTED Final   Klebsiella oxytoca NOT DETECTED NOT DETECTED Final   Klebsiella pneumoniae DETECTED (A) NOT DETECTED Final    Comment: CRITICAL RESULT CALLED TO, READ BACK BY AND VERIFIED WITH: L. Seay Pharm.D. 10:35 11/08/16 ([email or web address])    Proteus species NOT DETECTED NOT DETECTED Final   Serratia marcescens NOT DETECTED NOT DETECTED Final   Carbapenem resistance NOT DETECTED NOT DETECTED Final   Haemophilus influenzae NOT DETECTED NOT DETECTED Final   Neisseria meningitidis NOT DETECTED NOT DETECTED Final   Pseudomonas aeruginosa NOT DETECTED NOT DETECTED Final   Candida albicans NOT DETECTED NOT DETECTED Final   Candida glabrata NOT DETECTED NOT DETECTED Final   Candida krusei NOT DETECTED NOT DETECTED Final   Candida parapsilosis NOT DETECTED NOT DETECTED Final   Candida tropicalis NOT DETECTED NOT DETECTED Final    Comment: Performed at [facility]         Radiology Studies: Dg Chest Port 1 View  Result Date: 11/07/2016 CLINICAL DATA:  Fever.  Recent fall EXAM: PORTABLE CHEST 1 VIEW COMPARISON:  April 10, 2013 FINDINGS: There is no edema or consolidation. Heart is upper normal in size with pulmonary vascularity within normal limits. No adenopathy. Patient is status post internal mammary bypass grafting. No pneumothorax. No bone lesions. IMPRESSION: No edema or consolidation.  No evident pneumothorax. Electronically Signed   By: Lowella Grip III M.D.   On: 11/07/2016 16:29   Dg Femur Min 2 Views  Right  Result Date: 11/07/2016 CLINICAL DATA:  Right lower extremity pain status post fall. EXAM: RIGHT FEMUR 2 VIEWS COMPARISON:  None. FINDINGS: There is no evidence of fracture or other focal bone lesions. There are 3 compartment osteoarthritic changes of the right knee, with a small right suprapatellar joint effusion. Moderate osteoarthritic changes of the right hip joint also noted. Vascular calcifications within the soft tissues. IMPRESSION: No acute fracture or dislocation identified about the right femur. Osteoarthritic changes of the right hip  right knee. Electronically Signed   By: Fidela Salisbury M.D.   On: 11/07/2016 16:38        Scheduled Meds: . donepezil  5 mg Oral Daily  . feeding supplement (ENSURE ENLIVE)  237 mL Oral BID BM  . loratadine  10 mg Oral Daily  . pantoprazole  40 mg Oral QPM  . piperacillin-tazobactam (ZOSYN)  IV  2.25 g Intravenous Q8H  . simvastatin  20 mg Oral QHS  . sodium chloride flush  3 mL Intravenous Q12H   Continuous Infusions: . lactated ringers 125 mL/hr at 11/08/16 0701     LOS: 1 day    Jaxon Flatt Tanna Furry, MD [facility] Pager [phone]  If 7PM-7AM, please contact night-coverage [email or web address] Password [email or web address] 11/08/2016, 12:17 PM

## 2016-11-08 NOTE — Progress Notes (Addendum)
Patient's vital signs are as follows.    11/08/16 1545  Vitals  Temp 98.9 F (37.2 C)  Temp Source Oral  BP (!) 96/47  BP Location Right Arm  BP Method Automatic  Patient Position (if appropriate) Lying  Pulse Rate 92  Resp 18  Oxygen Therapy  SpO2 100 %  O2 Device Room Air     11/08/16 1545  Vitals  Temp 98.9 F (37.2 C)  Temp Source Oral  BP (!) 96/47  BP Location Right Arm  BP Method Automatic  Patient Position (if appropriate) Lying  Pulse Rate 92  Resp 18  Oxygen Therapy  SpO2 100 %  O2 Device Room Air   MD notified and received verbal order to administer normal saline  500 cc bolus. Will continue to monitor patient's vital signs.

## 2016-11-08 NOTE — Progress Notes (Addendum)
Pt's vital signs are as follows   11/08/16 1742  Vitals  Temp 99.2 F (37.3 C)  Temp Source Oral  BP (!) 90/41  BP Location Left Arm  BP Method Automatic  Patient Position (if appropriate) Lying  Pulse Rate 62  Pulse Rate Source Dinamap  Resp 18  Oxygen Therapy  SpO2 100 %  O2 Device Room Air  Pain Assessment  Pain Assessment No/denies pain  Pain Score 0   Pt asymptomatic at this time. MD notified via E-Page. Received verbal order to administer normal saline 500 cc bolus. Will continue to monitor patient throughout the shift.

## 2016-11-08 NOTE — Care Management Important Message (Signed)
Important Message  Patient Details  Name: Lance Grant MRN: [id number] Date of Birth: 1920/12/01   Medicare Important Message Given:  Yes    Lyonel Morejon, Chauncey Reading, RN 11/08/2016, 9:37 AM

## 2016-11-08 NOTE — Progress Notes (Signed)
PT Cancellation Note  Patient Details Name: Lance Grant MRN: [id number] DOB: 1921-01-12   Cancelled Treatment:    Reason Eval/Treat Not Completed: Patient not medically ready (Spoke with Lattie Haw, RN who states that the pt has had low BP at 96/47, and they are currently trying to re-establish IV access to administer bolus of fluids.  )  Beth Edwin Cherian, PT, DPT X: [phone]

## 2016-11-08 NOTE — Progress Notes (Addendum)
ANTICOAGULATION CONSULT NOTE -   Pharmacy Consult for Coumadin Indication: pulmonary embolus  Allergies  Allergen Reactions  . Erythromycin     Patient Measurements: Height: 5\' 9"  (175.3 cm) Weight: 143 lb 8.3 oz (65.1 kg) IBW/kg (Calculated) : 70.7  Vital Signs: Temp: 100.6 F (38.1 C) (01/05 0700) Temp Source: Oral (01/05 0700) BP: 107/47 (01/05 0700) Pulse Rate: 81 (01/05 0700)  Labs:  Recent Labs  11/07/16 1532 11/07/16 2142 11/08/16 0411  HGB 10.0*  --  9.0*  HCT 28.8*  --  26.1*  PLT 153  --  142*  APTT  --  64*  --   LABPROT 31.6* 34.6* 36.5*  INR 2.98 3.33 3.57  CREATININE 4.34*  --  3.73*    Estimated Creatinine Clearance: 11.4 mL/min (by C-G formula based on SCr of 3.73 mg/dL (H)).   Medical History: Past Medical History:  Diagnosis Date  . Arteriosclerotic cardiovascular disease (ASCVD)    CABG 10/2001 nl EF  . Chronic anticoagulation    Managed by [facility]  . Chronic kidney disease, stage III (moderate)    Creatinine-1.9 08/2005, 1.5 02/2007, 1.7 12/2007, 1.7 06/2008  . COPD (chronic obstructive pulmonary disease) ([place])   . GERD (gastroesophageal reflux disease)   . Hyperlipidemia   . Hypertension   . Leg swelling    chronic  . Peripheral vascular disease ([place])   . Pleural effusion   . Pulmonary embolism [facility]) 2007   2007  . Seminal vesiculitis   . Sinus bradycardia    When on beta blockers    Medications:  Prescriptions Prior to Admission  Medication Sig Dispense Refill Last Dose  . donepezil (ARICEPT) 5 MG tablet Take 5 mg by mouth daily.   11/07/2016 at Unknown time  . furosemide (LASIX) 20 MG tablet Take 1 tablet (20 mg total) by mouth daily. 90 tablet 3 11/07/2016 at Unknown time  . lisinopril (PRINIVIL,ZESTRIL) 10 MG tablet Take 1 tablet (10 mg total) by mouth daily. 90 tablet 1 11/07/2016 at Unknown time  . loratadine (CLARITIN) 10 MG tablet Take 10 mg by mouth daily.   11/07/2016 at Unknown time  . metoprolol tartrate (LOPRESSOR) 25 MG  tablet Take 1 tablet by mouth daily.  1 11/07/2016 at Unknown time  . pantoprazole (PROTONIX) 40 MG tablet Take 40 mg by mouth every evening.    11/06/2016 at Unknown time  . simvastatin (ZOCOR) 20 MG tablet Take 1 tablet (20 mg total) by mouth at bedtime. 90 tablet 1 11/06/2016 at Unknown time  . warfarin (COUMADIN) 2 MG tablet TAKE 2 TABLETS BY MOUTH DAILY EXCEPT 3 TABLETS ON WEDNESDAY 90 tablet 3 11/07/2016 at 1000  . nitroGLYCERIN (NITROLINGUAL) 0.4 MG/SPRAY spray Place 1 spray under the tongue every 5 (five) minutes as needed for chest pain. 12 g 12 unknown    Assessment: 83 yo male presents to ED with thigh pain s/p fall He is chronically anticoagulated for a PE from 2007.  He has a h/o hematuria and went to see urology this week, but not overly concerned. He has a stable mass on the kidney. Discussed coumadin administration with Dr. Lorin Mercy and will hold off on dose today. Will follow up with labs in AM and plan for treatment once patient more stabilized. INR this AM is elevated at 3.57  Goal of Therapy:  INR 2-3 Monitor platelets by anticoagulation protocol: Yes   Plan:  No Coumadin today Daily PT-INR Monitor for bleeding  Isac Sarna, BS [place], BCPS Clinical Pharmacist Pager #  [phone] 11/08/2016,10:06 AM

## 2016-11-08 NOTE — Care Management Note (Signed)
Case Management Note  Patient Details  Name: Lance Grant MRN: [id number] Date of Birth: 1921-10-13  Subjective/Objective: Patient adm from home with sepsis/fall. He lives with wife, was walking with cane PTA to fall. He and wife have RN caregiver at home, she is there 10a-2p daily and again 7p-7a daily.  PT eval pending. Caregiver states that they are agreeable to any [facility] recommendations.    Action/Plan: CM will following for needs.   Expected Discharge Date:     11/09/2016            Expected Discharge Plan:  [place]  In-House Referral:     Discharge planning Services  CM Consult  Post Acute Care Choice:    Choice offered to:     DME Arranged:    DME Agency:     HH Arranged:    Shorter Agency:     Status of Service:  In process, will continue to follow  If discussed at Long Length of Stay Meetings, dates discussed:    Additional Comments:  Lance Grant, Lance Reading, RN 11/08/2016, 9:22 AM

## 2016-11-08 NOTE — Progress Notes (Signed)
CRITICAL VALUE ALERT  Critical value received:  Positive blood cultures  Date of notification: 11/08/16  Time of notification:  0520  Critical value read back:Yes.    Nurse who received alert:  Maryruth Bun  MD notified (1st page):  Marin Comment  Time of first page:  0520  MD notified (2nd page):  Time of second page:  Responding MD:  Marin Comment  Time MD responded:  [phone]

## 2016-11-08 NOTE — Progress Notes (Signed)
CRITICAL VALUE ALERT  Critical value received: Blood cultures positive for gram negative rods  Date of notification:  11/08/2016  Time of notification:  1030  Critical value read back:Yes  Nurse who received alert:  Vista Deck  MD notified (1st page):  Dr. Carolin Sicks  Time of first page: 1040

## 2016-11-08 NOTE — Progress Notes (Signed)
Initial Nutrition Assessment  INTERVENTION:  Ensure Enlive po BID, each supplement provides 350 kcal and 20 grams of protein (he prefers Chocolate flavor)  Regular diet   NUTRITION DIAGNOSIS:   Inadequate oral intake related to acute illness as evidenced by   weight loss trend of 10% (20# over the past year)  GOAL:   Patient will meet greater than or equal to 90% of their needs  MONITOR:   PO intake, Supplement acceptance, Labs, Weight trends  REASON FOR ASSESSMENT:   Malnutrition Screening Tool, Consult Assessment of nutrition requirement/status  ASSESSMENT:  The pt is a 83 yo male who fell and presents to ED with thigh pain. On visiting room he is sleeping after being given something for his pain. His son provided most of history.  The patient has been losing some weight 20# (10%)over the past year.  Home diet is regular and his intake comes and goes at home. There is a caregiver who prepares meals for him. Most days eats well for breakfast and likes Boosts. He usually drinks 2 per day. He has drank most of 2 Ensure Enlives today.    Suspect this gentleman is malnourished related to chronic disease process- based on his weight loss and intake report but unable to confirm with physical exam.    Recent Labs Lab 11/07/16 1532 11/08/16 0411  NA 132* 141  K 5.4* 3.9  CL 103 111  CO2 23 24  BUN 67* 61*  CREATININE 4.34* 3.73*  CALCIUM 9.3 8.6*  GLUCOSE 106* 134*    Labs: elevated BUN and Cr  Unable to complete Nutrition-Focused physical exam at this time.  Patient is not awake (was recently given pain meds).  Diet Order:  Diet regular Room service appropriate? Yes; Fluid consistency: Thin  Skin:  Reviewed, no issues  Last BM:  1/4 loose, watery  Height:   Ht Readings from Last 1 Encounters:  11/07/16 5\' 9"  (1.753 m)    Weight:   Wt Readings from Last 1 Encounters:  11/07/16 143 lb 8.3 oz (65.1 kg)    Ideal Body Weight:  73 kg  BMI:  Body mass index is  21.19 kg/m.  Estimated Nutritional Needs:   Kcal:  1950-2175  Protein:  55-60 gr  Fluid:  >1.6 liters daily  EDUCATION NEEDS:   No education needs identified at this time  Colman Cater MS,RD,CSG,LDN Office: [phone] Pager: [phone]

## 2016-11-08 NOTE — Progress Notes (Signed)
PHARMACY - PHYSICIAN COMMUNICATION CRITICAL VALUE ALERT - BLOOD CULTURE IDENTIFICATION (BCID)  Results for orders placed or performed during the hospital encounter of 11/07/16  Blood Culture ID Panel (Reflexed) (Collected: 11/07/2016  3:50 PM)  Result Value Ref Range   Enterococcus species NOT DETECTED NOT DETECTED   Listeria monocytogenes NOT DETECTED NOT DETECTED   Staphylococcus species NOT DETECTED NOT DETECTED   Staphylococcus aureus NOT DETECTED NOT DETECTED   Streptococcus species NOT DETECTED NOT DETECTED   Streptococcus agalactiae NOT DETECTED NOT DETECTED   Streptococcus pneumoniae NOT DETECTED NOT DETECTED   Streptococcus pyogenes NOT DETECTED NOT DETECTED   Acinetobacter baumannii NOT DETECTED NOT DETECTED   Enterobacteriaceae species DETECTED (A) NOT DETECTED   Enterobacter cloacae complex NOT DETECTED NOT DETECTED   Escherichia coli NOT DETECTED NOT DETECTED   Klebsiella oxytoca NOT DETECTED NOT DETECTED   Klebsiella pneumoniae DETECTED (A) NOT DETECTED   Proteus species NOT DETECTED NOT DETECTED   Serratia marcescens NOT DETECTED NOT DETECTED   Carbapenem resistance NOT DETECTED NOT DETECTED   Haemophilus influenzae NOT DETECTED NOT DETECTED   Neisseria meningitidis NOT DETECTED NOT DETECTED   Pseudomonas aeruginosa NOT DETECTED NOT DETECTED   Candida albicans NOT DETECTED NOT DETECTED   Candida glabrata NOT DETECTED NOT DETECTED   Candida krusei NOT DETECTED NOT DETECTED   Candida parapsilosis NOT DETECTED NOT DETECTED   Candida tropicalis NOT DETECTED NOT DETECTED    Name of physician (or Provider) Contacted: Dr. Carolin Sicks  Asessment/Plan: Blood cultures  positive x 2 bottles with K. Pneumoniae. No resistance noted. Patient on Zosyn. Continue current regimen. F/U sensitivities and deescalate as indicated.  Thanks for the opportunity to participate in the care of this patient,  Lance Grant, BS [place], [place] Clinical Pharmacist Pager [phone] 11/08/2016  10:59  AM

## 2016-11-08 NOTE — Telephone Encounter (Signed)
Pt's son wanted you to be aware that Lance Grant has been admitted and that the hospitalitis have taken him off his coumadin

## 2016-11-09 LAB — CBC
HEMATOCRIT: 25.1 % — AB (ref 39.0–52.0)
HEMOGLOBIN: 8.5 g/dL — AB (ref 13.0–17.0)
MCH: 28.6 pg (ref 26.0–34.0)
MCHC: 33.9 g/dL (ref 30.0–36.0)
MCV: 84.5 fL (ref 78.0–100.0)
Platelets: 120 10*3/uL — ABNORMAL LOW (ref 150–400)
RBC: 2.97 MIL/uL — AB (ref 4.22–5.81)
RDW: 15.9 % — ABNORMAL HIGH (ref 11.5–15.5)
WBC: 7.6 10*3/uL (ref 4.0–10.5)

## 2016-11-09 LAB — BASIC METABOLIC PANEL
Anion gap: 10 (ref 5–15)
BUN: 57 mg/dL — AB (ref 6–20)
CHLORIDE: 111 mmol/L (ref 101–111)
CO2: 18 mmol/L — AB (ref 22–32)
Calcium: 8.2 mg/dL — ABNORMAL LOW (ref 8.9–10.3)
Creatinine, Ser: 3.11 mg/dL — ABNORMAL HIGH (ref 0.61–1.24)
GFR calc Af Amer: 18 mL/min — ABNORMAL LOW (ref >60)
GFR calc non Af Amer: 16 mL/min — ABNORMAL LOW (ref >60)
Glucose, Bld: 131 mg/dL — ABNORMAL HIGH (ref 65–99)
POTASSIUM: 3.7 mmol/L (ref 3.5–5.1)
Sodium: 139 mmol/L (ref 135–145)

## 2016-11-09 LAB — GLUCOSE, CAPILLARY
GLUCOSE-CAPILLARY: 148 mg/dL — AB (ref 65–99)
GLUCOSE-CAPILLARY: 168 mg/dL — AB (ref 65–99)
Glucose-Capillary: 125 mg/dL — ABNORMAL HIGH (ref 65–99)

## 2016-11-09 LAB — URINE CULTURE

## 2016-11-09 LAB — PROTIME-INR
INR: 4.91
PROTHROMBIN TIME: 47.2 s — AB (ref 11.4–15.2)

## 2016-11-09 MED ORDER — DEXTROSE 5 % IV SOLN
10.0000 mg | Freq: Once | INTRAVENOUS | Status: AC
  Administered 2016-11-09: 10 mg via INTRAVENOUS
  Filled 2016-11-09: qty 1

## 2016-11-09 NOTE — Progress Notes (Addendum)
PROGRESS NOTE    Lance Grant  [id number] DOB: [birth year] DOA: 11/07/2016 PCP: Maggie Font, MD   Brief Narrative: 83 y.o. male with medical history significant of renal mass; PE (2007); PVD; HTN; HLD; COPD; CKD; and chronic anticoagulation presenting after a fall. As per H&P, discussed with urology, Dr. Alyson Ingles. He reports that the patient has a large left renal mass with persistent bleeding from the mass.  The patient has declined evaluation and treatment of the mass.  Patient now with UTI, Klebsiella bacteremia.  Assessment & Plan:   Principal Problem:   Sepsis ([place]) Active Problems:   Chronic kidney disease, stage III (moderate)   Chronic anticoagulation   Acute renal failure (HCC)   UTI (urinary tract infection)   Mild malnutrition (HCC)   Anemia   Fall   Acute renal failure superimposed on chronic kidney disease ([place])  # Sepsis due to Klebsiella bacteremia likely related with urinary tract infection: -Both blood culture bottles growing gram-negative rod consistent with Klebsiella. Currently on IV Zosyn. Follow-up final culture results. Patient is afebrile today and leukocytosis improving. Plan to repeat blood cultures today. -Continue IV fluid, Tylenol as needed -Patient has Foley catheter inserted in Er.  #Fall: Likely in the setting of infection. PT, OT and social worker evaluation. Likely benefit from rehabilitation on discharge.  -Discussed with the patient's son and daughter at bedside. He agreed with the rehabilitation on discharge. -Had leg pain on admission. X-ray with no fracture.  #Acute on chronic kidney disease: The last serum creatinine level was 1.7 in May 2016. Admitted with serum creatinine level of 4.3 likely in the setting of UTI and sepsis. Serum creatinine level trending down to 3.1 today. Continue to monitor. Avoid nephrotoxins. -Patient with renal mass and currently with hematuria. Patient was recently evaluated by a urologist outpatient.  Discontinued Coumadin.  #History of pulmonary embolism in 2017 and on chronic anticoagulation: Given hematuria,  Anemia and frequent falls that systemic anticoagulation might not be safe for this patient. I think the risk of bleeding outweighs the benefit. I discussed this with the patient's son in detail and he agreed with the plan. Discontinued Coumadin. -Patient had supratherapeutic INR today. Mild drop in hemoglobin. I ordered a dose of vitamin K. Repeat lab in the morning.  #Possible mild to moderate protein calorie malnutrition: Patient with decreased oral intake. Dietary consult. Continue with oral supplement and regular diet. Continue supportive care.  #Possible dementia without behavioral issue, likely Alzheimer's: Continue Aricept. Supportive care.  # Normocytic anemia likely multifactorial etiology including hematuria and chronic kidney disease. Hemoglobin dropped to 8.5. Monitor CBC.  #Hypotension improved with IV fluid. Blood pressure acceptable today. Continue to monitor.  DVT prophylaxis: Has high INR. SCD Code Status: DO NOT RESUSCITATE Family Communication: Discussed with the patient's son and daughter at bedside. Disposition Plan: Likely discharge to nursing home in 1-2 days. Patient lives with his wife who is 81 years old.    Consultants:   None  Procedures: None IV Zosyn Antimicrobials:  Subjective: Patient was seen and examined at bedside. Patient reported he is tired and wants to sleep. Denied leg pain, nausea, vomiting, chest pain or shortness of breath. Patient's son and daughter at bedside. Objective: Vitals:   11/08/16 1742 11/08/16 2113 11/09/16 0643 11/09/16 1300  BP: (!) 90/41 (!) 124/50 (!) 121/54 122/62  Pulse: 62 85 79 82  Resp: 18  16 16   Temp: 99.2 F (37.3 C) 98.7 F (37.1 C) 98.7 F (37.1 C) 98.2 F (  36.8 C)  TempSrc: Oral Oral Oral Oral  SpO2: 100% 98% 98% 96%  Weight:      Height:        Intake/Output Summary (Last 24 hours) at  11/09/16 1307 Last data filed at 11/09/16 1200  Gross per 24 hour  Intake          1303.42 ml  Output             1450 ml  Net          -146.58 ml   Filed Weights   11/07/16 1525 11/07/16 2001  Weight: 65.8 kg (145 lb) 65.1 kg (143 lb 8.3 oz)    Examination:  General exam:Not in distress, lying in bed comfortable.  Respiratory system: Clear bilateral, respiratory effort normal. No wheezing. Cardiovascular system: Regular rate and rhythm, S1-S2 normal. No pedal edema. Gastrointestinal system: Abdomen is nondistended, soft and nontender. Normal bowel sounds heard. Central nervous system: Alert Awake and following commands. Extremities: Symmetric 5 x 5 power. Skin: No rashes, lesions or ulcers Psychiatry: Judgement and insight appear impaired     Data Reviewed: I have personally reviewed following labs and imaging studies  CBC:  Recent Labs Lab 11/07/16 1532 11/08/16 0411 11/09/16 0731  WBC 11.9* 11.6* 7.6  NEUTROABS 10.1*  --   --   HGB 10.0* 9.0* 8.5*  HCT 28.8* 26.1* 25.1*  MCV 83.0 83.1 84.5  PLT 153 142* 123456*   Basic Metabolic Panel:  Recent Labs Lab 11/07/16 1532 11/08/16 0411 11/09/16 0731  NA 132* 141 139  K 5.4* 3.9 3.7  CL 103 111 111  CO2 23 24 18*  GLUCOSE 106* 134* 131*  BUN 67* 61* 57*  CREATININE 4.34* 3.73* 3.11*  CALCIUM 9.3 8.6* 8.2*   GFR: Estimated Creatinine Clearance: 13.7 mL/min (by C-G formula based on SCr of 3.11 mg/dL (H)). Liver Function Tests:  Recent Labs Lab 11/07/16 1532  AST 65*  ALT 33  ALKPHOS 46  BILITOT 0.5  PROT 6.6  ALBUMIN 3.2*   No results for input(s): LIPASE, AMYLASE in the last 168 hours. No results for input(s): AMMONIA in the last 168 hours. Coagulation Profile:  Recent Labs Lab 11/07/16 1532 11/07/16 2142 11/08/16 0411 11/09/16 0731  INR 2.98 3.33 3.57 4.91*   Cardiac Enzymes: No results for input(s): CKTOTAL, CKMB, CKMBINDEX, TROPONINI in the last 168 hours. BNP (last 3 results) No  results for input(s): PROBNP in the last 8760 hours. HbA1C: No results for input(s): HGBA1C in the last 72 hours. CBG:  Recent Labs Lab 11/09/16 0736  GLUCAP 148*   Lipid Profile: No results for input(s): CHOL, HDL, LDLCALC, TRIG, CHOLHDL, LDLDIRECT in the last 72 hours. Thyroid Function Tests: No results for input(s): TSH, T4TOTAL, FREET4, T3FREE, THYROIDAB in the last 72 hours. Anemia Panel: No results for input(s): VITAMINB12, FOLATE, FERRITIN, TIBC, IRON, RETICCTPCT in the last 72 hours. Sepsis Labs:  Recent Labs Lab 11/07/16 1542 11/07/16 1908 11/07/16 2142  PROCALCITON  --   --  13.98  LATICACIDVEN 1.15 0.9 1.2    Recent Results (from the past 240 hour(s))  Urine culture     Status: Abnormal   Collection Time: 11/07/16  3:30 PM  Result Value Ref Range Status   Specimen Description URINE, CLEAN CATCH  Final   Special Requests NONE  Final   Culture MULTIPLE SPECIES PRESENT, SUGGEST RECOLLECTION (A)  Final   Report Status 11/09/2016 FINAL  Final  Blood Culture (routine x 2)     Status:  Abnormal (Preliminary result)   Collection Time: 11/07/16  3:40 PM  Result Value Ref Range Status   Specimen Description BLOOD LEFT HAND  Final   Special Requests BOTTLES DRAWN AEROBIC ONLY 4CC AEB  Final   Culture  Setup Time   Final    GRAM NEGATIVE RODS Gram Stain Report Called to,Read Back By and Verified With: BULLINS L. AT 1018A ON OS:[id number] BY THOMPSON S. AEROBIC BOTTLE ONLY Performed at [place] (A)  Final   Report Status PENDING  Incomplete  Blood Culture (routine x 2)     Status: Abnormal (Preliminary result)   Collection Time: 11/07/16  3:50 PM  Result Value Ref Range Status   Specimen Description BLOOD RIGHT ARM  Final   Special Requests BOTTLES DRAWN AEROBIC AND ANAEROBIC [place]  Final   Culture  Setup Time   Final    GRAM NEGATIVE RODS IN BOTH AEROBIC AND ANAEROBIC BOTTLES Gram Stain Report Called to,Read Back By and  Verified With: HANDY T AT 0527 ON OS:[id number] BY FORSYTH K CRITICAL RESULT CALLED TO, READ BACK BY AND VERIFIED WITH: L SEAY,PHARMD AT 1035 11/08/16 BY L BENFIELD    Culture (A)  Final    KLEBSIELLA PNEUMONIAE SUSCEPTIBILITIES TO FOLLOW Performed at [facility]    Report Status PENDING  Incomplete  Blood Culture ID Panel (Reflexed)     Status: Abnormal   Collection Time: 11/07/16  3:50 PM  Result Value Ref Range Status   Enterococcus species NOT DETECTED NOT DETECTED Final   Listeria monocytogenes NOT DETECTED NOT DETECTED Final   Staphylococcus species NOT DETECTED NOT DETECTED Final   Staphylococcus aureus NOT DETECTED NOT DETECTED Final   Streptococcus species NOT DETECTED NOT DETECTED Final   Streptococcus agalactiae NOT DETECTED NOT DETECTED Final   Streptococcus pneumoniae NOT DETECTED NOT DETECTED Final   Streptococcus pyogenes NOT DETECTED NOT DETECTED Final   Acinetobacter baumannii NOT DETECTED NOT DETECTED Final   Enterobacteriaceae species DETECTED (A) NOT DETECTED Final    Comment: CRITICAL RESULT CALLED TO, READ BACK BY AND VERIFIED WITH: L. Seay Pharm.D. 10:35 11/08/16 ([email or web address])    Enterobacter cloacae complex NOT DETECTED NOT DETECTED Final   Escherichia coli NOT DETECTED NOT DETECTED Final   Klebsiella oxytoca NOT DETECTED NOT DETECTED Final   Klebsiella pneumoniae DETECTED (A) NOT DETECTED Final    Comment: CRITICAL RESULT CALLED TO, READ BACK BY AND VERIFIED WITH: L. Seay Pharm.D. 10:35 11/08/16 ([email or web address])    Proteus species NOT DETECTED NOT DETECTED Final   Serratia marcescens NOT DETECTED NOT DETECTED Final   Carbapenem resistance NOT DETECTED NOT DETECTED Final   Haemophilus influenzae NOT DETECTED NOT DETECTED Final   Neisseria meningitidis NOT DETECTED NOT DETECTED Final   Pseudomonas aeruginosa NOT DETECTED NOT DETECTED Final   Candida albicans NOT DETECTED NOT DETECTED Final   Candida glabrata NOT DETECTED NOT DETECTED Final   Candida krusei NOT DETECTED  NOT DETECTED Final   Candida parapsilosis NOT DETECTED NOT DETECTED Final   Candida tropicalis NOT DETECTED NOT DETECTED Final    Comment: Performed at [facility]         Radiology Studies: Dg Chest Port 1 View  Result Date: 11/07/2016 CLINICAL DATA:  Fever.  Recent fall EXAM: PORTABLE CHEST 1 VIEW COMPARISON:  April 10, 2013 FINDINGS: There is no edema or consolidation. Heart is upper normal in size with pulmonary vascularity within normal limits. No adenopathy. Patient is status post internal mammary  bypass grafting. No pneumothorax. No bone lesions. IMPRESSION: No edema or consolidation.  No evident pneumothorax. Electronically Signed   By: Lowella Grip III M.D.   On: 11/07/2016 16:29   Dg Femur Min 2 Views Right  Result Date: 11/07/2016 CLINICAL DATA:  Right lower extremity pain status post fall. EXAM: RIGHT FEMUR 2 VIEWS COMPARISON:  None. FINDINGS: There is no evidence of fracture or other focal bone lesions. There are 3 compartment osteoarthritic changes of the right knee, with a small right suprapatellar joint effusion. Moderate osteoarthritic changes of the right hip joint also noted. Vascular calcifications within the soft tissues. IMPRESSION: No acute fracture or dislocation identified about the right femur. Osteoarthritic changes of the right hip right knee. Electronically Signed   By: Fidela Salisbury M.D.   On: 11/07/2016 16:38        Scheduled Meds: . donepezil  5 mg Oral Daily  . feeding supplement (ENSURE ENLIVE)  237 mL Oral BID BM  . loratadine  10 mg Oral Daily  . pantoprazole  40 mg Oral QPM  . phytonadione (VITAMIN K) IV  10 mg Intravenous Once  . piperacillin-tazobactam (ZOSYN)  IV  2.25 g Intravenous Q8H  . simvastatin  20 mg Oral QHS  . sodium chloride flush  3 mL Intravenous Q12H   Continuous Infusions: . lactated ringers 125 mL/hr (11/09/16 0635)     LOS: 2 days    Sofi Bryars Tanna Furry, MD [facility] Pager [phone]  If  7PM-7AM, please contact night-coverage [email or web address] Password [email or web address] 11/09/2016, 1:07 PM

## 2016-11-10 LAB — BASIC METABOLIC PANEL
Anion gap: 6 (ref 5–15)
BUN: 53 mg/dL — AB (ref 6–20)
CALCIUM: 8.2 mg/dL — AB (ref 8.9–10.3)
CO2: 25 mmol/L (ref 22–32)
CREATININE: 2.82 mg/dL — AB (ref 0.61–1.24)
Chloride: 108 mmol/L (ref 101–111)
GFR calc non Af Amer: 18 mL/min — ABNORMAL LOW (ref >60)
GFR, EST AFRICAN AMERICAN: 21 mL/min — AB (ref >60)
Glucose, Bld: 108 mg/dL — ABNORMAL HIGH (ref 65–99)
Potassium: 4.1 mmol/L (ref 3.5–5.1)
SODIUM: 139 mmol/L (ref 135–145)

## 2016-11-10 LAB — CULTURE, BLOOD (ROUTINE X 2)

## 2016-11-10 LAB — GLUCOSE, CAPILLARY
GLUCOSE-CAPILLARY: 108 mg/dL — AB (ref 65–99)
GLUCOSE-CAPILLARY: 108 mg/dL — AB (ref 65–99)
Glucose-Capillary: 155 mg/dL — ABNORMAL HIGH (ref 65–99)

## 2016-11-10 LAB — CBC
HCT: 22.5 % — ABNORMAL LOW (ref 39.0–52.0)
Hemoglobin: 7.7 g/dL — ABNORMAL LOW (ref 13.0–17.0)
MCH: 28.3 pg (ref 26.0–34.0)
MCHC: 34.2 g/dL (ref 30.0–36.0)
MCV: 82.7 fL (ref 78.0–100.0)
PLATELETS: 131 10*3/uL — AB (ref 150–400)
RBC: 2.72 MIL/uL — ABNORMAL LOW (ref 4.22–5.81)
RDW: 15.6 % — ABNORMAL HIGH (ref 11.5–15.5)
WBC: 8.8 10*3/uL (ref 4.0–10.5)

## 2016-11-10 LAB — PROTIME-INR
INR: 1.5
PROTHROMBIN TIME: 18.3 s — AB (ref 11.4–15.2)

## 2016-11-10 MED ORDER — CEFTRIAXONE SODIUM 2 G IJ SOLR
2.0000 g | INTRAMUSCULAR | Status: DC
  Administered 2016-11-10 – 2016-11-12 (×3): 2 g via INTRAVENOUS
  Filled 2016-11-10 (×4): qty 2

## 2016-11-10 MED ORDER — DEXTROSE 5 % IV SOLN
INTRAVENOUS | Status: AC
  Filled 2016-11-10: qty 2

## 2016-11-10 NOTE — Progress Notes (Signed)
PROGRESS NOTE    Lance Grant  [id number] DOB: [birth year] DOA: 11/07/2016 PCP: Maggie Font, MD   Brief Narrative: 83 y.o. male with medical history significant of renal mass; PE (2007); PVD; HTN; HLD; COPD; CKD; and chronic anticoagulation presenting after a fall. As per H&P, discussed with urology, Dr. Alyson Ingles. He reports that the patient has a large left renal mass with persistent bleeding from the mass.  The patient has declined evaluation and treatment of the mass.  Patient now with UTI, Klebsiella bacteremia.  Assessment & Plan:   Principal Problem:   Sepsis ([place]) Active Problems:   Chronic kidney disease, stage III (moderate)   Chronic anticoagulation   Acute renal failure (HCC)   UTI (urinary tract infection)   Mild malnutrition (HCC)   Anemia   Fall   Acute renal failure superimposed on chronic kidney disease ([place])  # Sepsis due to Klebsiella bacteremia likely related with urinary tract infection: -Both blood culture bottles growing gram-negative rod consistent with Klebsiella. Switching her antibiotics from Zosyn to ceftriaxone. Follow up final culture results. Repeating 2 sets of blood culture today.  -Continue IV fluid, Tylenol as needed -Patient has Foley catheter inserted in Er. -Patient is clinically improving.  #Fall: Likely in the setting of infection. PT, OT and social worker evaluation. Likely benefit from rehabilitation on discharge.  -Discussed with the patient's son and daughter at bedside. They agreed with the rehabilitation on discharge. -Had leg pain on admission. X-ray with no fracture.  #Acute on chronic kidney disease: The last serum creatinine level was 1.7 in May 2016. Admitted with serum creatinine level of 4.3 likely in the setting of UTI and sepsis. Serum creatinine level trending down to 2.8 today. Continue to monitor. Avoid nephrotoxins. -Patient with renal mass and had hematuria on admission which is improved. Patient was recently  evaluated by a urologist outpatient. Discontinued Coumadin.  #History of pulmonary embolism in 2017 and on chronic anticoagulation: Given hematuria,  Anemia and frequent falls that systemic anticoagulation might not be safe for this patient. I think the risk of bleeding outweighs the benefit. I discussed this with the patient's son in detail and he agreed with the plan. Discontinued Coumadin. -Patient received a dose of vitamin K with improvement in INR to 1.5 today. Continue to monitor.  #Possible mild to moderate protein calorie malnutrition: Patient with decreased oral intake. Dietary consult. Continue with oral supplement and regular diet. Continue supportive care.  #Possible dementia without behavioral issue, likely Alzheimer's: Continue Aricept. Supportive care.  # Normocytic anemia likely multifactorial etiology including hematuria and chronic kidney disease. Hemoglobin dropped to 7.7 today. Today's drop may be contributed by IV fluid. Hematuria resolved. No GI bleed. No hematoma or bruising noticed on lower extremities. Continue to monitor CBC. -Mild thrombocytopenia likely in the setting of sepsis. Platelet count is improving. No bleeding noticed.  #Hypotension improved with IV fluid. Blood pressure acceptable today. Continue to monitor.  DVT prophylaxis: Still has high INR and patient is anemic and thrombocytopenic. SCD Code Status: DO NOT RESUSCITATE Family Communication: Discussed with the patient's son and daughter at bedside. Disposition Plan: Likely discharge to nursing home in 1-2 days. Patient lives with his wife who is 18 years old.    Consultants:   None  Procedures:   IV Zosyn 1/4-1/7 IV ceftriaxone 1/7- Antimicrobials:  Subjective: Patient was seen and examined at bedside. Denied any symptoms. Resting comfortably on bed. Denied headache, dizziness, nausea, vomiting, chest pain or shortness of breath. Patient's son and  daughter at bedside. Objective: Vitals:    11/09/16 0643 11/09/16 1300 11/09/16 2103 11/10/16 0400  BP: (!) 121/54 122/62 116/66 116/69  Pulse: 79 82 79 61  Resp: 16 16 17 18   Temp: 98.7 F (37.1 C) 98.2 F (36.8 C) 98.3 F (36.8 C) 98.9 F (37.2 C)  TempSrc: Oral Oral Oral Oral  SpO2: 98% 96% 96% 95%  Weight:      Height:        Intake/Output Summary (Last 24 hours) at 11/10/16 1024 Last data filed at 11/10/16 0347  Gross per 24 hour  Intake              343 ml  Output             1650 ml  Net            -1307 ml   Filed Weights   11/07/16 1525 11/07/16 2001  Weight: 65.8 kg (145 lb) 65.1 kg (143 lb 8.3 oz)    Examination:  General exam:Pleasant elderly male lying in bed comfortable, not in distress.Marland Kitchen  Respiratory system: Clear bilateral, respiratory effort normal. No wheezing noticed.. Cardiovascular system: Regular rate and rhythm, S1-S2 normal. No pedal edema. Gastrointestinal system: Abdomen is nondistended, soft and nontender. Normal bowel sounds heard. Central nervous system: Alert Awake and following commands. Extremities: Symmetric 5 x 5 power. Skin: No rashes, lesions or ulcers Psychiatry: Judgement and insight appear impaired  Foley catheter with clear urine.   Data Reviewed: I have personally reviewed following labs and imaging studies  CBC:  Recent Labs Lab 11/07/16 1532 11/08/16 0411 11/09/16 0731 11/10/16 0616  WBC 11.9* 11.6* 7.6 8.8  NEUTROABS 10.1*  --   --   --   HGB 10.0* 9.0* 8.5* 7.7*  HCT 28.8* 26.1* 25.1* 22.5*  MCV 83.0 83.1 84.5 82.7  PLT 153 142* 120* [id number]*   Basic Metabolic Panel:  Recent Labs Lab 11/07/16 1532 11/08/16 0411 11/09/16 0731 11/10/16 0616  NA 132* 141 139 139  K 5.4* 3.9 3.7 4.1  CL 103 111 111 108  CO2 23 24 18* 25  GLUCOSE 106* 134* 131* 108*  BUN 67* 61* 57* 53*  CREATININE 4.34* 3.73* 3.11* 2.82*  CALCIUM 9.3 8.6* 8.2* 8.2*   GFR: Estimated Creatinine Clearance: 15.1 mL/min (by C-G formula based on SCr of 2.82 mg/dL (H)). Liver Function  Tests:  Recent Labs Lab 11/07/16 1532  AST 65*  ALT 33  ALKPHOS 46  BILITOT 0.5  PROT 6.6  ALBUMIN 3.2*   No results for input(s): LIPASE, AMYLASE in the last 168 hours. No results for input(s): AMMONIA in the last 168 hours. Coagulation Profile:  Recent Labs Lab 11/07/16 1532 11/07/16 2142 11/08/16 0411 11/09/16 0731 11/10/16 0616  INR 2.98 3.33 3.57 4.91* 1.50   Cardiac Enzymes: No results for input(s): CKTOTAL, CKMB, CKMBINDEX, TROPONINI in the last 168 hours. BNP (last 3 results) No results for input(s): PROBNP in the last 8760 hours. HbA1C: No results for input(s): HGBA1C in the last 72 hours. CBG:  Recent Labs Lab 11/09/16 0736 11/09/16 1608 11/09/16 2106 11/10/16 0726  GLUCAP 148* 168* 125* 108*   Lipid Profile: No results for input(s): CHOL, HDL, LDLCALC, TRIG, CHOLHDL, LDLDIRECT in the last 72 hours. Thyroid Function Tests: No results for input(s): TSH, T4TOTAL, FREET4, T3FREE, THYROIDAB in the last 72 hours. Anemia Panel: No results for input(s): VITAMINB12, FOLATE, FERRITIN, TIBC, IRON, RETICCTPCT in the last 72 hours. Sepsis Labs:  Recent Labs Lab 11/07/16 1542 11/07/16  1908 11/07/16 2142  PROCALCITON  --   --  13.98  LATICACIDVEN 1.15 0.9 1.2    Recent Results (from the past 240 hour(s))  Urine culture     Status: Abnormal   Collection Time: 11/07/16  3:30 PM  Result Value Ref Range Status   Specimen Description URINE, CLEAN CATCH  Final   Special Requests NONE  Final   Culture MULTIPLE SPECIES PRESENT, SUGGEST RECOLLECTION (A)  Final   Report Status 11/09/2016 FINAL  Final  Blood Culture (routine x 2)     Status: Abnormal   Collection Time: 11/07/16  3:40 PM  Result Value Ref Range Status   Specimen Description BLOOD LEFT HAND  Final   Special Requests BOTTLES DRAWN AEROBIC ONLY 4CC  Final   Culture  Setup Time   Final    GRAM NEGATIVE RODS Gram Stain Report Called to,Read Back By and Verified With: BULLINS L. AT 1018A ON OS:[id number]  BY THOMPSON S. AEROBIC BOTTLE ONLY    Culture (A)  Final    KLEBSIELLA PNEUMONIAE SUSCEPTIBILITIES PERFORMED ON PREVIOUS CULTURE WITHIN THE LAST 5 DAYS. Performed at [facility]    Report Status 11/10/2016 FINAL  Final  Blood Culture (routine x 2)     Status: Abnormal   Collection Time: 11/07/16  3:50 PM  Result Value Ref Range Status   Specimen Description BLOOD RIGHT ARM  Final   Special Requests BOTTLES DRAWN AEROBIC AND ANAEROBIC 6CC EACH  Final   Culture  Setup Time   Final    GRAM NEGATIVE RODS IN BOTH AEROBIC AND ANAEROBIC BOTTLES Gram Stain Report Called to,Read Back By and Verified With: HANDY T AT 0527 ON [id number] BY FORSYTH K CRITICAL RESULT CALLED TO, READ BACK BY AND VERIFIED WITH: L SEAY,PHARMD AT 1035 11/08/16 BY L BENFIELD Performed at [place] (A)  Final   Report Status 11/10/2016 FINAL  Final   Organism ID, Bacteria KLEBSIELLA PNEUMONIAE  Final      Susceptibility   Klebsiella pneumoniae - MIC*    AMPICILLIN >=32 RESISTANT Resistant     CEFAZOLIN <=4 SENSITIVE Sensitive     CEFEPIME <=1 SENSITIVE Sensitive     CEFTAZIDIME <=1 SENSITIVE Sensitive     CEFTRIAXONE <=1 SENSITIVE Sensitive     CIPROFLOXACIN <=0.25 SENSITIVE Sensitive     GENTAMICIN <=1 SENSITIVE Sensitive     IMIPENEM <=0.25 SENSITIVE Sensitive     TRIMETH/SULFA <=20 SENSITIVE Sensitive     AMPICILLIN/SULBACTAM 4 SENSITIVE Sensitive     PIP/TAZO <=4 SENSITIVE Sensitive     Extended ESBL NEGATIVE Sensitive     * KLEBSIELLA PNEUMONIAE  Blood Culture ID Panel (Reflexed)     Status: Abnormal   Collection Time: 11/07/16  3:50 PM  Result Value Ref Range Status   Enterococcus species NOT DETECTED NOT DETECTED Final   Listeria monocytogenes NOT DETECTED NOT DETECTED Final   Staphylococcus species NOT DETECTED NOT DETECTED Final   Staphylococcus aureus NOT DETECTED NOT DETECTED Final   Streptococcus species NOT DETECTED NOT DETECTED Final    Streptococcus agalactiae NOT DETECTED NOT DETECTED Final   Streptococcus pneumoniae NOT DETECTED NOT DETECTED Final   Streptococcus pyogenes NOT DETECTED NOT DETECTED Final   Acinetobacter baumannii NOT DETECTED NOT DETECTED Final   Enterobacteriaceae species DETECTED (A) NOT DETECTED Final    Comment: CRITICAL RESULT CALLED TO, READ BACK BY AND VERIFIED WITH: L. Seay Pharm.D. 10:35 11/08/16 ([email or web address])    Enterobacter cloacae complex NOT  DETECTED NOT DETECTED Final   Escherichia coli NOT DETECTED NOT DETECTED Final   Klebsiella oxytoca NOT DETECTED NOT DETECTED Final   Klebsiella pneumoniae DETECTED (A) NOT DETECTED Final    Comment: CRITICAL RESULT CALLED TO, READ BACK BY AND VERIFIED WITH: L. Seay Pharm.D. 10:35 11/08/16 ([email or web address])    Proteus species NOT DETECTED NOT DETECTED Final   Serratia marcescens NOT DETECTED NOT DETECTED Final   Carbapenem resistance NOT DETECTED NOT DETECTED Final   Haemophilus influenzae NOT DETECTED NOT DETECTED Final   Neisseria meningitidis NOT DETECTED NOT DETECTED Final   Pseudomonas aeruginosa NOT DETECTED NOT DETECTED Final   Candida albicans NOT DETECTED NOT DETECTED Final   Candida glabrata NOT DETECTED NOT DETECTED Final   Candida krusei NOT DETECTED NOT DETECTED Final   Candida parapsilosis NOT DETECTED NOT DETECTED Final   Candida tropicalis NOT DETECTED NOT DETECTED Final    Comment: Performed at [facility]  Culture, blood (routine x 2)     Status: None (Preliminary result)   Collection Time: 11/10/16  8:42 AM  Result Value Ref Range Status   Specimen Description BLOOD RIGHT ANTECUBITAL  Final   Special Requests BOTTLES DRAWN AEROBIC AND ANAEROBIC 8CC  Final   Culture PENDING  Incomplete   Report Status PENDING  Incomplete  Culture, blood (routine x 2)     Status: None (Preliminary result)   Collection Time: 11/10/16  8:50 AM  Result Value Ref Range Status   Specimen Description BLOOD LEFT ANTECUBITAL  Final   Special Requests BOTTLES  DRAWN AEROBIC AND ANAEROBIC 8CC  Final   Culture PENDING  Incomplete   Report Status PENDING  Incomplete         Radiology Studies: No results found.      Scheduled Meds: . cefTRIAXone (ROCEPHIN)  IV  2 g Intravenous Q24H  . donepezil  5 mg Oral Daily  . feeding supplement (ENSURE ENLIVE)  237 mL Oral BID BM  . loratadine  10 mg Oral Daily  . pantoprazole  40 mg Oral QPM  . simvastatin  20 mg Oral QHS  . sodium chloride flush  3 mL Intravenous Q12H   Continuous Infusions: . lactated ringers 125 mL/hr (11/10/16 0121)     LOS: 3 days    Dron Tanna Furry, MD [facility] Pager [phone]  If 7PM-7AM, please contact night-coverage [email or web address] Password [facility] 11/10/2016, 10:24 AM

## 2016-11-11 LAB — BASIC METABOLIC PANEL
ANION GAP: 5 (ref 5–15)
BUN: 46 mg/dL — AB (ref 6–20)
CHLORIDE: 106 mmol/L (ref 101–111)
CO2: 27 mmol/L (ref 22–32)
Calcium: 8.3 mg/dL — ABNORMAL LOW (ref 8.9–10.3)
Creatinine, Ser: 2.61 mg/dL — ABNORMAL HIGH (ref 0.61–1.24)
GFR calc Af Amer: 23 mL/min — ABNORMAL LOW (ref >60)
GFR, EST NON AFRICAN AMERICAN: 20 mL/min — AB (ref >60)
GLUCOSE: 117 mg/dL — AB (ref 65–99)
Potassium: 4.3 mmol/L (ref 3.5–5.1)
SODIUM: 138 mmol/L (ref 135–145)

## 2016-11-11 LAB — PROTIME-INR
INR: 1.46
Prothrombin Time: 17.9 seconds — ABNORMAL HIGH (ref 11.4–15.2)

## 2016-11-11 LAB — GLUCOSE, CAPILLARY
GLUCOSE-CAPILLARY: 95 mg/dL (ref 65–99)
GLUCOSE-CAPILLARY: 92 mg/dL (ref 65–99)
Glucose-Capillary: 114 mg/dL — ABNORMAL HIGH (ref 65–99)
Glucose-Capillary: 100 mg/dL — ABNORMAL HIGH (ref 65–99)

## 2016-11-11 LAB — CBC
HCT: 24.5 % — ABNORMAL LOW (ref 39.0–52.0)
HEMOGLOBIN: 8.4 g/dL — AB (ref 13.0–17.0)
MCH: 28.4 pg (ref 26.0–34.0)
MCHC: 34.3 g/dL (ref 30.0–36.0)
MCV: 82.8 fL (ref 78.0–100.0)
PLATELETS: 142 10*3/uL — AB (ref 150–400)
RBC: 2.96 MIL/uL — AB (ref 4.22–5.81)
RDW: 15.7 % — ABNORMAL HIGH (ref 11.5–15.5)
WBC: 8.8 10*3/uL (ref 4.0–10.5)

## 2016-11-11 NOTE — Evaluation (Signed)
Physical Therapy Evaluation Patient Details Name: Lance Grant MRN: [id number] DOB: 29-Oct-1921 Today's Date: 11/11/2016   History of Present Illness  83 y.o. male with medical history significant of renal mass; PE (2007); PVD; HTN; HLD; COPD; CKD; and chronic anticoagulation presenting after a fall.  About midnight last night, he got to go to the bathroom and he tripped and fell.  He didn't feel bad and didn't think he broke anything.  Caregiver couldn't get him up but she and son were able to get him up and back to bed.  Sepsis due to Klebsiella bacteremia likely related with urinary tract infection.  Clinical Impression  Pt received in bed, dtr present, and pt is agreeable to PT evaluation.  Pt and dtr expressed that prior to admission, pt was able to ambulate short household distances with a cane.  He was also independent with dressing and bathing.  He does have a caregiver that assists him with breakfast, as well as household chores, and cleaning.  During today's PT evaluation, he required Max A for supine<>sit and requires assistance to maintain balance sitting on the EOB.  Pt expresses multiple times that he is trying his best, however, he requires increased encouragement to attempt self mobilization.  Pt required Maxi move for transfer bed<>chair, and he is recommended for SNF at this time due to increased level of assistance required for all functional mobility tasks at this time.      Follow Up Recommendations SNF    Equipment Recommendations  None recommended by PT    Recommendations for Other Services       Precautions / Restrictions Precautions Precautions: Fall Precaution Comments: Reason for admission.  Restrictions Weight Bearing Restrictions: No      Mobility  Bed Mobility Overal bed mobility: Needs Assistance Bed Mobility: Supine to Sit     Supine to sit: Max assist;HOB elevated     General bed mobility comments: increased time and use of bed pad to assist pt  to the EOB.  Pt is extremely kyphotic sitting on the EOB with extreme forward head, and posterior pelvic tilt.  Therefore, he requires assistance to maintain balance sitting on the EOB.    Transfers Overall transfer level: Needs assistance                  Ambulation/Gait                Stairs            Wheelchair Mobility    Modified Rankin (Stroke Patients Only)       Balance Overall balance assessment: History of Falls;Needs assistance Sitting-balance support: Bilateral upper extremity supported;Feet supported Sitting balance-Leahy Scale: Poor Sitting balance - Comments: Pt demonstrates posterior lean due to core weakness with posterior pelvic tilt, increased kyphosis and forward head.  Practiced core strengthening with lateral weight shifting and trunk rotation.   Standing balance support:  (NA due to poor sitting balance. )                                 Pertinent Vitals/Pain Pain Assessment: 0-10 Pain Score: 5  Pain Location: R LE - "It just hurts."  Pain Intervention(s): Limited activity within patient's tolerance;Monitored during session;Repositioned    Home Living   Living Arrangements: Spouse/significant other Available Help at Discharge: Personal care attendant (caregiver comes in the mornining to cook breakfast, and does the housekeeping, washing.  ) Type  of Home: House Home Access: Stairs to enter   [employer] of Steps: 1 step and incline from the car - this was difficult PTA. Home Layout: Laundry or work area in basement;One level;Able to live on main level with bedroom/bathroom Home Equipment: Gilford Rile - 2 wheels;Cane - single point;Walker - 4 wheels;Bedside commode;Shower seat - built in      Prior Function Level of Independence: Independent with assistive device(s)   Gait / Transfers Assistance Needed: Pt states he was using the cane for ambulation prior to admission.  Pt states he was just going room to room  in the house.    ADL's / Homemaking Assistance Needed: Independent with dressing, and bathing.  Family assists with running errands and grocery shopping.          Hand Dominance   Dominant Hand: Right    Extremity/Trunk Assessment   Upper Extremity Assessment Upper Extremity Assessment: Generalized weakness    Lower Extremity Assessment Lower Extremity Assessment: Generalized weakness    Cervical / Trunk Assessment Cervical / Trunk Assessment: Kyphotic  Communication   Communication: No difficulties  Cognition Arousal/Alertness: Awake/alert Behavior During Therapy: WFL for tasks assessed/performed Overall Cognitive Status: Impaired/Different from baseline Area of Impairment: Orientation Orientation Level: Time (Does not know the year, but knows the month and day of the week. )                  General Comments      Exercises     Assessment/Plan    PT Assessment Patient needs continued PT services  PT Problem List Decreased strength;Decreased range of motion;Decreased activity tolerance;Decreased balance;Decreased mobility;Decreased cognition;Decreased knowledge of use of DME;Decreased safety awareness;Decreased knowledge of precautions;Cardiopulmonary status limiting activity;Decreased skin integrity;Pain          PT Treatment Interventions DME instruction;Functional mobility training;Therapeutic activities;Therapeutic exercise;Balance training;Patient/family education    PT Goals (Current goals can be found in the Care Plan section)  Acute Rehab PT Goals Patient Stated Goal: To get stronger PT Goal Formulation: With patient/family Time For Goal Achievement: 11/18/16 Potential to Achieve Goals: Fair    Frequency Min 3X/week   Barriers to discharge Decreased caregiver support      Co-evaluation               End of Session   Activity Tolerance: Patient limited by fatigue Patient left: in chair;with call bell/phone within reach;with  family/visitor present Nurse Communication: Mobility status (Lauren, RN assisted pt with transfer bed<>chair via maxi move.  mobility sheet left in the room. )    Functional Assessment Tool Used: [employer] "6-clicks"  Functional Limitation: Mobility: Walking and moving around Mobility: Walking and Moving Around Current Status [phone]): At least 60 percent but less than 80 percent impaired, limited or restricted Mobility: Walking and Moving Around Goal Status [phone]): At least 40 percent but less than 60 percent impaired, limited or restricted    Time: [id number] PT Time Calculation (min) (ACUTE ONLY): 45 min   Charges:   PT Evaluation $PT Eval Moderate Complexity: 1 Procedure PT Treatments $Therapeutic Activity: 23-37 mins   PT G Codes:   PT G-Codes **NOT FOR INPATIENT CLASS** Functional Assessment Tool Used: [employer] "6-clicks"  Functional Limitation: Mobility: Walking and moving around Mobility: Walking and Moving Around Current Status [phone]): At least 60 percent but less than 80 percent impaired, limited or restricted Mobility: Walking and Moving Around Goal Status [phone]): At least 40 percent but less than 60 percent impaired, limited or restricted  Beth Arianni Gallego, PT, DPT X: [phone]

## 2016-11-11 NOTE — Evaluation (Signed)
Clinical/Bedside Swallow Evaluation Patient Details  Name: Lance Grant MRN: [id number] Date of Birth: 1921-03-30  Today's Date: 11/11/2016 Time: SLP Start Time (ACUTE ONLY): 0933 SLP Stop Time (ACUTE ONLY): 1001 SLP Time Calculation (min) (ACUTE ONLY): 28 min  Past Medical History:  Past Medical History:  Diagnosis Date  . Arteriosclerotic cardiovascular disease (ASCVD)    CABG 10/2001 nl EF  . Chronic anticoagulation    Managed by [facility]  . Chronic kidney disease, stage III (moderate)    Creatinine-1.9 08/2005, 1.5 02/2007, 1.7 12/2007, 1.7 06/2008  . COPD (chronic obstructive pulmonary disease) ([place])   . GERD (gastroesophageal reflux disease)   . Hyperlipidemia   . Hypertension   . Leg swelling    chronic  . Peripheral vascular disease ([place])   . Pleural effusion   . Pulmonary embolism [facility]) 2007   2007  . Seminal vesiculitis   . Sinus bradycardia    When on beta blockers   Past Surgical History:  Past Surgical History:  Procedure Laterality Date  . CORONARY ARTERY BYPASS GRAFT  10/2001  . TRANSURETHRAL RESECTION OF PROSTATE  07/2005   HPI:  83 y.o. male with medical history significant of renal mass; PE (2007); PVD; HTN; HLD; COPD; CKD; and chronic anticoagulation presenting after a fall. As per H&P, discussed with urology, Dr. Alyson Ingles. He reports that the patient has a large left renal mass with persistent bleeding from the mass.  The patient has declined evaluation and treatment of the mass.  Patient now with UTI, Klebsiella bacteremia. Chest x-ray on 11/07/2016 shows no acute changes. Pt's daughter and son present for evaluation and report that Pt typically eats foods more in the AM and drinks Boost throughout the day. He was ambulatory prior to admission.   Assessment / Plan / Recommendation Clinical Impression  Pt seen at bedside for clinical swallow evaluation with family present in room. Family (son and daughter) report that Pt lives at home with spouse and they  have a caregiver at times. Pt was ambulatory prior to admission and able to self feed. He is received in bed with head turned to the left. Pt required verbal and gentle tactile cues to turn head to midline and reports pain on right neck when doing so. RN assisted me in repositioning Pt, however he continues to keep head turned left. Oral motor examination reveals mild generalized oral weakness. Pt edentulous, but has U/L dentures. He states that he does not wear these unless he is eating "hard" foods. His family reports that he generally eats breakfast and then drinks Boost throughout the day at home. Pt assessed with ice chips, thin water via cup and straw sips, puree, and mech soft textures. Pt without overt signs or symptoms of aspiration. No complaints of residue or globus. Recommend D3/mech soft with thin liquids with standard aspiratin and reflux precautions. Pt would benefit from SLP f/u for diet tolerance if he goes to SNF (PT eval pending). SLP will follow while in acute setting. Pt and family in agreement with plan of care.    Aspiration Risk  Mild aspiration risk    Diet Recommendation Dysphagia 3 (Mech soft);Thin liquid   Liquid Administration via: Cup;Straw Medication Administration: Whole meds with liquid Supervision: Staff to assist with self feeding;Full supervision/cueing for compensatory strategies Compensations: Slow rate Postural Changes: Seated upright at 90 degrees;Remain upright for at least 30 minutes after po intake    Other  Recommendations Oral Care Recommendations: Oral care BID;Staff/trained caregiver to provide oral care  Other Recommendations: Clarify dietary restrictions   Follow up Recommendations Skilled Nursing facility      Frequency and Duration min 2x/week  1 week       Prognosis Prognosis for Safe Diet Advancement: Good Barriers to Reach Goals: Cognitive deficits      Swallow Study   General Date of Onset: 11/07/16 HPI: 83 y.o. male with medical  history significant of renal mass; PE (2007); PVD; HTN; HLD; COPD; CKD; and chronic anticoagulation presenting after a fall. As per H&P, discussed with urology, Dr. Alyson Ingles. He reports that the patient has a large left renal mass with persistent bleeding from the mass.  The patient has declined evaluation and treatment of the mass.  Patient now with UTI, Klebsiella bacteremia. Chest x-ray on 11/07/2016 shows no acute changes. Pt's daughter and son present for evaluation and report that Pt typically eats foods more in the AM and drinks Boost throughout the day. He was ambulatory prior to admission. Type of Study: Bedside Swallow Evaluation Previous Swallow Assessment: none on record Diet Prior to this Study: Regular;Thin liquids Temperature Spikes Noted: No Respiratory Status: Room air History of Recent Intubation: No Behavior/Cognition: Alert;Cooperative;Pleasant mood Oral Cavity Assessment: Within Functional Limits Oral Care Completed by SLP: Yes Oral Cavity - Dentition: Edentulous (has U/L dentures) Vision: Functional for self-feeding Self-Feeding Abilities: Needs assist (self fed PTA) Patient Positioning: Upright in bed Baseline Vocal Quality: Normal Volitional Cough: Strong Volitional Swallow: Able to elicit    Oral/Motor/Sensory Function Overall Oral Motor/Sensory Function: Generalized oral weakness Facial ROM: Within Functional Limits Facial Symmetry: Within Functional Limits Facial Strength: Within Functional Limits Facial Sensation: Within Functional Limits Lingual ROM: Within Functional Limits Lingual Symmetry: Within Functional Limits Lingual Strength: Reduced Lingual Sensation: Within Functional Limits Velum: Within Functional Limits Mandible: Within Functional Limits   Ice Chips Ice chips: Within functional limits Presentation: Spoon   Thin Liquid Thin Liquid: Within functional limits Presentation: Cup;Self Fed;Straw Other Comments:  (hand over hand assist for self feeding  with right hand)    Nectar Thick Nectar Thick Liquid: Not tested   Honey Thick Honey Thick Liquid: Not tested   Puree Puree: Within functional limits Presentation: Spoon   Solid   Thank you,  Genene Churn, CCC-SLP [phone]    Solid: Within functional limits (mech soft) Presentation: Spoon        Lance Grant,Lance Grant 11/11/2016,10:56 AM

## 2016-11-11 NOTE — Progress Notes (Signed)
PROGRESS NOTE    Lance Grant  [id number] DOB: July 25, 1921 DOA: 11/07/2016 PCP: Maggie Font, MD   Brief Narrative: 83 y.o. male with medical history significant of renal mass; PE (2007); PVD; HTN; HLD; COPD; CKD; and chronic anticoagulation presenting after a fall. As per H&P, discussed with urology, Dr. Alyson Ingles. He reports that the patient has a large left renal mass with persistent bleeding from the mass.  The patient has declined evaluation and treatment of the mass.  Patient now with UTI, Klebsiella bacteremia.  Assessment & Plan:   Principal Problem:   Sepsis ([place]) Active Problems:   Chronic kidney disease, stage III (moderate)   Chronic anticoagulation   Acute renal failure (HCC)   UTI (urinary tract infection)   Mild malnutrition (HCC)   Anemia   Fall   Acute renal failure superimposed on chronic kidney disease ([place])  # Sepsis due to Klebsiella bacteremia likely related with urinary tract infection: -Both blood culture bottles growing gram-negative rod consistent with Klebsiella. Sensitive to ceftriaxone. Continue IV ceftriaxone for now. Follow-up the repeat blood culture test. -Patient had low-grade temperature last night. Continue to monitor. Continue IV fluid, Tylenol as needed. Patient is overall clinically stable. -Patient has Foley catheter inserted in [place].   #Fall: Likely in the setting of infection. PT recommended SNF. Discussed with the social worker regarding safe discharge planning to rehabilitation. Family agreed with the plan.  -Had leg pain on admission. X-ray with no fracture.  #Acute on chronic kidney disease: The last serum creatinine level was 1.7 in May 2016. Admitted with serum creatinine level of 4.3 likely in the setting of UTI and sepsis. Serum creatinine level trending down to 2.6 today. Continue to monitor. Avoid nephrotoxins. -Patient with renal mass and had hematuria on admission which is improved. Patient was recently evaluated by a urologist  outpatient. Discontinued Coumadin.  #History of pulmonary embolism in 2017 and on chronic anticoagulation: Given hematuria,  anemia and frequent falls that systemic anticoagulation might not be safe for this patient. I think the risk of bleeding outweighs the benefit. I discussed this with the patient's son in detail and he agreed with the plan. Discontinued Coumadin. -Patient received a dose of vitamin K with improvement in INR to 1.4 today. I will discontinue daily INR test.  #Possible mild to moderate protein calorie malnutrition: Patient with decreased oral intake. Dietary consult.  -Ordered dysphagia level III diet today.  #Possible dementia without behavioral issue, likely Alzheimer's: Continue Aricept. Supportive care.  # Normocytic anemia likely multifactorial etiology including hematuria and chronic kidney disease. Hemoglobin level improved to 8.8  -Hematuria resolved. No GI bleed. No hematoma or bruising noticed on lower extremities. Continue to monitor CBC. -Mild thrombocytopenia likely in the setting of sepsis. Platelet count is improving. No bleeding noticed.  #Hypotension improved with IV fluid. Blood pressure acceptable today. Continue to monitor.  DVT prophylaxis: Still has high INR and patient is anemic and thrombocytopenic. SCD Code Status: DO NOT RESUSCITATE Family Communication: Discussed with the patient's son and daughter at bedside. Disposition Plan: Likely discharge to nursing home in 1-2 days. Patient lives with his wife who is 52 years old.    Consultants:   None  Procedures:   IV Zosyn 1/4-1/7 IV ceftriaxone 1/7- Antimicrobials:  Subjective: Patient was seen and examined at bedside. No new event. Patient denied nausea vomiting chest pain or shortness of breath. Both son and daughter at bedside.    Objective: Vitals:   11/10/16 1301 11/10/16 1430 11/10/16 2200  11/11/16 0556  BP: (!) 115/54  (!) 117/53 (!) 127/48  Pulse: 87  65 75  Resp: 20  20 20     Temp:  98.5 F (36.9 C) 98.4 F (36.9 C) 98.4 F (36.9 C)  TempSrc:  Oral Oral Oral  SpO2: 95%  94% 95%  Weight:      Height:        Intake/Output Summary (Last 24 hours) at 11/11/16 1512 Last data filed at 11/11/16 0849  Gross per 24 hour  Intake          6310.92 ml  Output             1850 ml  Net          4460.92 ml   Filed Weights   11/07/16 1525 11/07/16 2001  Weight: 65.8 kg (145 lb) 65.1 kg (143 lb 8.3 oz)    Examination:  General exam:Pleasant male lying in bed, comfortable, not in distress Respiratory system: Clear bilateral, respiratory effort normal. No wheezing noticed.. Cardiovascular system: Regular rate and rhythm, S1-S2 normal. No pedal edema. Gastrointestinal system: Abdomen is nondistended, soft and nontender. Normal bowel sounds heard. Central nervous system: Alert Awake and following commands. Extremities: Symmetric 5 x 5 power. Skin: No rashes, lesions or ulcers Psychiatry: Judgement and insight appear impaired  Foley catheter with clear urine.   Data Reviewed: I have personally reviewed following labs and imaging studies  CBC:  Recent Labs Lab 11/07/16 1532 11/08/16 0411 11/09/16 0731 11/10/16 0616 11/11/16 0547  WBC 11.9* 11.6* 7.6 8.8 8.8  NEUTROABS 10.1*  --   --   --   --   HGB 10.0* 9.0* 8.5* 7.7* 8.4*  HCT 28.8* 26.1* 25.1* 22.5* 24.5*  MCV 83.0 83.1 84.5 82.7 82.8  PLT 153 142* 120* 131* [id number]*   Basic Metabolic Panel:  Recent Labs Lab 11/07/16 1532 11/08/16 0411 11/09/16 0731 11/10/16 0616 11/11/16 0547  NA 132* 141 139 139 138  K 5.4* 3.9 3.7 4.1 4.3  CL 103 111 111 108 106  CO2 23 24 18* 25 27  GLUCOSE 106* 134* 131* 108* 117*  BUN 67* 61* 57* 53* 46*  CREATININE 4.34* 3.73* 3.11* 2.82* 2.61*  CALCIUM 9.3 8.6* 8.2* 8.2* 8.3*   GFR: Estimated Creatinine Clearance: 16.3 mL/min (by C-G formula based on SCr of 2.61 mg/dL (H)). Liver Function Tests:  Recent Labs Lab 11/07/16 1532  AST 65*  ALT 33  ALKPHOS 46   BILITOT 0.5  PROT 6.6  ALBUMIN 3.2*   No results for input(s): LIPASE, AMYLASE in the last 168 hours. No results for input(s): AMMONIA in the last 168 hours. Coagulation Profile:  Recent Labs Lab 11/07/16 2142 11/08/16 0411 11/09/16 0731 11/10/16 0616 11/11/16 0547  INR 3.33 3.57 4.91* 1.50 1.46   Cardiac Enzymes: No results for input(s): CKTOTAL, CKMB, CKMBINDEX, TROPONINI in the last 168 hours. BNP (last 3 results) No results for input(s): PROBNP in the last 8760 hours. HbA1C: No results for input(s): HGBA1C in the last 72 hours. CBG:  Recent Labs Lab 11/10/16 1106 11/10/16 1653 11/11/16 0637 11/11/16 0739 11/11/16 1215  GLUCAP 155* 108* 95 92 114*   Lipid Profile: No results for input(s): CHOL, HDL, LDLCALC, TRIG, CHOLHDL, LDLDIRECT in the last 72 hours. Thyroid Function Tests: No results for input(s): TSH, T4TOTAL, FREET4, T3FREE, THYROIDAB in the last 72 hours. Anemia Panel: No results for input(s): VITAMINB12, FOLATE, FERRITIN, TIBC, IRON, RETICCTPCT in the last 72 hours. Sepsis Labs:  Recent Labs Lab 11/07/16  1542 11/07/16 1908 11/07/16 2142  PROCALCITON  --   --  13.98  LATICACIDVEN 1.15 0.9 1.2    Recent Results (from the past 240 hour(s))  Urine culture     Status: Abnormal   Collection Time: 11/07/16  3:30 PM  Result Value Ref Range Status   Specimen Description URINE, CLEAN CATCH  Final   Special Requests NONE  Final   Culture MULTIPLE SPECIES PRESENT, SUGGEST RECOLLECTION (A)  Final   Report Status 11/09/2016 FINAL  Final  Blood Culture (routine x 2)     Status: Abnormal   Collection Time: 11/07/16  3:40 PM  Result Value Ref Range Status   Specimen Description BLOOD LEFT HAND  Final   Special Requests BOTTLES DRAWN AEROBIC ONLY 4CC  Final   Culture  Setup Time   Final    GRAM NEGATIVE RODS Gram Stain Report Called to,Read Back By and Verified With: BULLINS L. AT 1018A ON [id number] BY THOMPSON S. AEROBIC BOTTLE ONLY    Culture (A)  Final     KLEBSIELLA PNEUMONIAE SUSCEPTIBILITIES PERFORMED ON PREVIOUS CULTURE WITHIN THE LAST 5 DAYS. Performed at [facility]    Report Status 11/10/2016 FINAL  Final  Blood Culture (routine x 2)     Status: Abnormal   Collection Time: 11/07/16  3:50 PM  Result Value Ref Range Status   Specimen Description BLOOD RIGHT ARM  Final   Special Requests BOTTLES DRAWN AEROBIC AND ANAEROBIC 6CC EACH  Final   Culture  Setup Time   Final    GRAM NEGATIVE RODS IN BOTH AEROBIC AND ANAEROBIC BOTTLES Gram Stain Report Called to,Read Back By and Verified With: HANDY T AT 0527 ON [id number] BY FORSYTH K CRITICAL RESULT CALLED TO, READ BACK BY AND VERIFIED WITH: L SEAY,PHARMD AT 1035 11/08/16 BY L BENFIELD Performed at [facility] (A)  Final   Report Status 11/10/2016 FINAL  Final   Organism ID, Bacteria KLEBSIELLA PNEUMONIAE  Final      Susceptibility   Klebsiella pneumoniae - MIC*    AMPICILLIN >=32 RESISTANT Resistant     CEFAZOLIN <=4 SENSITIVE Sensitive     CEFEPIME <=1 SENSITIVE Sensitive     CEFTAZIDIME <=1 SENSITIVE Sensitive     CEFTRIAXONE <=1 SENSITIVE Sensitive     CIPROFLOXACIN <=0.25 SENSITIVE Sensitive     GENTAMICIN <=1 SENSITIVE Sensitive     IMIPENEM <=0.25 SENSITIVE Sensitive     TRIMETH/SULFA <=20 SENSITIVE Sensitive     AMPICILLIN/SULBACTAM 4 SENSITIVE Sensitive     PIP/TAZO <=4 SENSITIVE Sensitive     Extended ESBL NEGATIVE Sensitive     * KLEBSIELLA PNEUMONIAE  Blood Culture ID Panel (Reflexed)     Status: Abnormal   Collection Time: 11/07/16  3:50 PM  Result Value Ref Range Status   Enterococcus species NOT DETECTED NOT DETECTED Final   Listeria monocytogenes NOT DETECTED NOT DETECTED Final   Staphylococcus species NOT DETECTED NOT DETECTED Final   Staphylococcus aureus NOT DETECTED NOT DETECTED Final   Streptococcus species NOT DETECTED NOT DETECTED Final   Streptococcus agalactiae NOT DETECTED NOT DETECTED Final    Streptococcus pneumoniae NOT DETECTED NOT DETECTED Final   Streptococcus pyogenes NOT DETECTED NOT DETECTED Final   Acinetobacter baumannii NOT DETECTED NOT DETECTED Final   Enterobacteriaceae species DETECTED (A) NOT DETECTED Final    Comment: CRITICAL RESULT CALLED TO, READ BACK BY AND VERIFIED WITH: L. Seay Pharm.D. 10:35 11/08/16 ([email or web address])    Enterobacter cloacae  complex NOT DETECTED NOT DETECTED Final   Escherichia coli NOT DETECTED NOT DETECTED Final   Klebsiella oxytoca NOT DETECTED NOT DETECTED Final   Klebsiella pneumoniae DETECTED (A) NOT DETECTED Final    Comment: CRITICAL RESULT CALLED TO, READ BACK BY AND VERIFIED WITH: L. Seay Pharm.D. 10:35 11/08/16 ([email or web address])    Proteus species NOT DETECTED NOT DETECTED Final   Serratia marcescens NOT DETECTED NOT DETECTED Final   Carbapenem resistance NOT DETECTED NOT DETECTED Final   Haemophilus influenzae NOT DETECTED NOT DETECTED Final   Neisseria meningitidis NOT DETECTED NOT DETECTED Final   Pseudomonas aeruginosa NOT DETECTED NOT DETECTED Final   Candida albicans NOT DETECTED NOT DETECTED Final   Candida glabrata NOT DETECTED NOT DETECTED Final   Candida krusei NOT DETECTED NOT DETECTED Final   Candida parapsilosis NOT DETECTED NOT DETECTED Final   Candida tropicalis NOT DETECTED NOT DETECTED Final    Comment: Performed at [facility]  Culture, blood (routine x 2)     Status: None (Preliminary result)   Collection Time: 11/10/16  8:42 AM  Result Value Ref Range Status   Specimen Description BLOOD RIGHT ANTECUBITAL  Final   Special Requests BOTTLES DRAWN AEROBIC AND ANAEROBIC 8CC  Final   Culture NO GROWTH < 24 HOURS  Final   Report Status PENDING  Incomplete  Culture, blood (routine x 2)     Status: None (Preliminary result)   Collection Time: 11/10/16  8:50 AM  Result Value Ref Range Status   Specimen Description BLOOD LEFT ANTECUBITAL  Final   Special Requests BOTTLES DRAWN AEROBIC AND ANAEROBIC 8CC  Final   Culture  NO GROWTH < 24 HOURS  Final   Report Status PENDING  Incomplete         Radiology Studies: No results found.      Scheduled Meds: . cefTRIAXone (ROCEPHIN)  IV  2 g Intravenous Q24H  . donepezil  5 mg Oral Daily  . feeding supplement (ENSURE ENLIVE)  237 mL Oral BID BM  . loratadine  10 mg Oral Daily  . pantoprazole  40 mg Oral QPM  . simvastatin  20 mg Oral QHS  . sodium chloride flush  3 mL Intravenous Q12H   Continuous Infusions: . lactated ringers 50 mL/hr at 11/10/16 1018     LOS: 4 days    Dron Tanna Furry, MD [facility] Pager [phone]  If 7PM-7AM, please contact night-coverage [email or web address] Password [email or web address] 11/11/2016, 3:12 PM

## 2016-11-11 NOTE — Care Management Important Message (Signed)
Important Message  Patient Details  Name: Lance Grant MRN: [id number] Date of Birth: February 03, 1921   Medicare Important Message Given:  Yes    Dellia Donnelly, Chauncey Reading, RN 11/11/2016, 12:49 PM

## 2016-11-11 NOTE — Telephone Encounter (Signed)
Hospital chart reviewed.  Pt still in hospital.  INR appt in office cancelled for today.

## 2016-11-12 DIAGNOSIS — J449 Chronic obstructive pulmonary disease, unspecified: Secondary | ICD-10-CM | POA: Diagnosis not present

## 2016-11-12 DIAGNOSIS — E7800 Pure hypercholesterolemia, unspecified: Secondary | ICD-10-CM | POA: Diagnosis not present

## 2016-11-12 DIAGNOSIS — N3001 Acute cystitis with hematuria: Secondary | ICD-10-CM | POA: Diagnosis not present

## 2016-11-12 DIAGNOSIS — E441 Mild protein-calorie malnutrition: Secondary | ICD-10-CM | POA: Diagnosis not present

## 2016-11-12 DIAGNOSIS — E785 Hyperlipidemia, unspecified: Secondary | ICD-10-CM | POA: Diagnosis not present

## 2016-11-12 DIAGNOSIS — M79604 Pain in right leg: Secondary | ICD-10-CM | POA: Diagnosis not present

## 2016-11-12 DIAGNOSIS — A419 Sepsis, unspecified organism: Secondary | ICD-10-CM | POA: Diagnosis not present

## 2016-11-12 DIAGNOSIS — R1312 Dysphagia, oropharyngeal phase: Secondary | ICD-10-CM | POA: Diagnosis not present

## 2016-11-12 DIAGNOSIS — R279 Unspecified lack of coordination: Secondary | ICD-10-CM | POA: Diagnosis not present

## 2016-11-12 DIAGNOSIS — R310 Gross hematuria: Secondary | ICD-10-CM | POA: Diagnosis not present

## 2016-11-12 DIAGNOSIS — I10 Essential (primary) hypertension: Secondary | ICD-10-CM | POA: Diagnosis not present

## 2016-11-12 DIAGNOSIS — N183 Chronic kidney disease, stage 3 (moderate): Secondary | ICD-10-CM | POA: Diagnosis not present

## 2016-11-12 DIAGNOSIS — N184 Chronic kidney disease, stage 4 (severe): Secondary | ICD-10-CM | POA: Diagnosis not present

## 2016-11-12 DIAGNOSIS — I2510 Atherosclerotic heart disease of native coronary artery without angina pectoris: Secondary | ICD-10-CM | POA: Diagnosis not present

## 2016-11-12 DIAGNOSIS — Z9181 History of falling: Secondary | ICD-10-CM | POA: Diagnosis not present

## 2016-11-12 DIAGNOSIS — R600 Localized edema: Secondary | ICD-10-CM | POA: Diagnosis not present

## 2016-11-12 DIAGNOSIS — D631 Anemia in chronic kidney disease: Secondary | ICD-10-CM | POA: Diagnosis not present

## 2016-11-12 DIAGNOSIS — N179 Acute kidney failure, unspecified: Secondary | ICD-10-CM | POA: Diagnosis not present

## 2016-11-12 DIAGNOSIS — K219 Gastro-esophageal reflux disease without esophagitis: Secondary | ICD-10-CM | POA: Diagnosis not present

## 2016-11-12 DIAGNOSIS — W19XXXA Unspecified fall, initial encounter: Secondary | ICD-10-CM | POA: Diagnosis not present

## 2016-11-12 DIAGNOSIS — M79661 Pain in right lower leg: Secondary | ICD-10-CM | POA: Diagnosis not present

## 2016-11-12 DIAGNOSIS — Z7901 Long term (current) use of anticoagulants: Secondary | ICD-10-CM | POA: Diagnosis not present

## 2016-11-12 DIAGNOSIS — D649 Anemia, unspecified: Secondary | ICD-10-CM | POA: Diagnosis not present

## 2016-11-12 DIAGNOSIS — N2889 Other specified disorders of kidney and ureter: Secondary | ICD-10-CM | POA: Diagnosis not present

## 2016-11-12 DIAGNOSIS — M6281 Muscle weakness (generalized): Secondary | ICD-10-CM | POA: Diagnosis not present

## 2016-11-12 LAB — BASIC METABOLIC PANEL
Anion gap: 4 — ABNORMAL LOW (ref 5–15)
BUN: 42 mg/dL — AB (ref 6–20)
CO2: 27 mmol/L (ref 22–32)
CREATININE: 2.31 mg/dL — AB (ref 0.61–1.24)
Calcium: 8.1 mg/dL — ABNORMAL LOW (ref 8.9–10.3)
Chloride: 107 mmol/L (ref 101–111)
GFR calc Af Amer: 26 mL/min — ABNORMAL LOW (ref >60)
GFR calc non Af Amer: 23 mL/min — ABNORMAL LOW (ref >60)
Glucose, Bld: 92 mg/dL (ref 65–99)
Potassium: 4.2 mmol/L (ref 3.5–5.1)
SODIUM: 138 mmol/L (ref 135–145)

## 2016-11-12 LAB — CBC
HCT: 22.3 % — ABNORMAL LOW (ref 39.0–52.0)
Hemoglobin: 7.9 g/dL — ABNORMAL LOW (ref 13.0–17.0)
MCH: 28.9 pg (ref 26.0–34.0)
MCHC: 35.4 g/dL (ref 30.0–36.0)
MCV: 81.7 fL (ref 78.0–100.0)
Platelets: 160 10*3/uL (ref 150–400)
RBC: 2.73 MIL/uL — ABNORMAL LOW (ref 4.22–5.81)
RDW: 15.8 % — AB (ref 11.5–15.5)
WBC: 8 10*3/uL (ref 4.0–10.5)

## 2016-11-12 LAB — PROTIME-INR
INR: 1.68
PROTHROMBIN TIME: 20 s — AB (ref 11.4–15.2)

## 2016-11-12 LAB — GLUCOSE, CAPILLARY: GLUCOSE-CAPILLARY: 100 mg/dL — AB (ref 65–99)

## 2016-11-12 MED ORDER — CEFADROXIL 500 MG PO CAPS: 500.0000 mg | ORAL_CAPSULE | Freq: Every day | ORAL | 0 refills | Status: AC

## 2016-11-12 MED ORDER — LACTINEX PO CHEW: 1.0000 | CHEWABLE_TABLET | Freq: Three times a day (TID) | ORAL | 0 refills | Status: AC

## 2016-11-12 NOTE — Care Management Note (Signed)
Case Management Note  Patient Details  Name: YUVIN DUBEL MRN: [id number] Date of Birth: 1921/02/21    If discussed at Long Length of Stay Meetings, dates discussed:  11/12/2016  Additional Comments:  Einar Nolasco, Chauncey Reading, RN 11/12/2016, 10:41 AM

## 2016-11-12 NOTE — Clinical Social Work Note (Signed)
CSW notified Keri at [facility] that patient was dischargeing.   CSW notified patient's son, Marta Antu, that patient was discharging and being transported to [facility].   CSW signing off.      Kaliope Quinonez, Clydene Pugh, LCSW

## 2016-11-12 NOTE — Clinical Social Work Note (Signed)
CSW spoke with patinet's son, Melissa Clemente. Mr. Demory stated that he and his family were interested in patient going to [facility] or [facility] for short term rehab. They advised that they desired for patient to be in [place] so that his wife could come and visit with him.   At baseline, patient ambulates with a cane and is independent in ADLs.      Zhana Jeangilles, Clydene Pugh, LCSW

## 2016-11-12 NOTE — Progress Notes (Signed)
Patient being discharged to [facility] called,and given to [facility] S, LPN .Family at bedside. No c/o pain or discomfort noted.Staff to accompany patient to awaiting facility.

## 2016-11-12 NOTE — Discharge Summary (Signed)
Physician Discharge Summary  Lance Grant [id number] DOB: 1921-02-06 DOA: 11/07/2016  PCP: Maggie Font, MD  Admit date: 11/07/2016 Discharge date: 11/12/2016  Admitted From:home Disposition:SNF  Recommendations for Outpatient Follow-up:  1. Follow up with PCP in 1-2 weeks 2. Please obtain BMP/CBC in one week 3. Follow up repeat blood culture sent on 11/10/2016.   Home Health:SNF Equipment/Devices:no Discharge Condition:stable CODE STATUS:DNR Diet recommendation:dysphagia level 3 (see below)  Brief/Interim Summary:83 y.o.malewith medical history significant of renal mass; PE (2007); PVD; HTN; HLD; COPD; CKD; and chronic anticoagulation presenting after a fall. As per H&P, discussed with urology, Dr. Alyson Ingles. He reports that the patient has a large left renal mass with persistent bleeding from the mass. The patient has declined evaluation and treatment of the mass. Patient now with UTI, Klebsiella bacteremia.  # Sepsis due to Klebsiella bacteremia likely related with urinary tract infection: -Both blood culture bottles growing gram-negative rod consistent with Klebsiella. Sensitive to ceftriaxone. Treated with ceftriaxone IV with clinical improvement. The blood cultures were repeated on 1/7, has been negative for more than 48 hours now. Since patient is clinically improving, plan to switch to oral antibiotics Duricef to complete total 2 weeks course. Follow-up pending culture results, final results. Patient is now afebrile.    #Fall: Likely in the setting of infection. PT recommended SNF. Discussed with the social worker regarding safe discharge planning to rehabilitation. Family agreed with the plan.  -Had leg pain on admission. X-ray with no fracture.  #Acute on chronic kidney disease: The last serum creatinine level was 1.7 in May 2016. Admitted with serum creatinine level of 4.3 likely in the setting of UTI and sepsis. Serum creatinine level trending down to 2.3 today.  Continue to monitor. Avoid nephrotoxins. -Patient with renal mass and had hematuria on admission which is improved. Patient was recently evaluated by a urologist outpatient. Discontinued Coumadin.  #History of pulmonary embolism in 2017 and on chronic anticoagulation: Given hematuria,  anemia and frequent falls, the systemic anticoagulation might not be safe for this patient. I think the risk of bleeding outweighs the benefit. I discussed this with the patient's son in detail and he agreed with the plan. Discontinued Coumadin. -Patient received a dose of vitamin K with improvement in INR.   #Possible mild to moderate protein calorie malnutrition: Patient with decreased oral intake. Dietary consult.  -On dysphagia diet. Needs assistance with feeding.  #Possible dementia without behavioral issue, likely Alzheimer's: Continue Aricept. Supportive care.  # Normocytic anemia likely multifactorial etiology including hematuria and chronic kidney disease. Mild fluctuation in hemoglobin level likely due to IV fluid. Patient has no active bleeding. Hemoglobin almost stable. Anticoagulation discontinued. Recommended to monitor CBC in a week. -Hematuria resolved. No GI bleed. No hematoma or bruising noticed on lower extremities. Continue to monitor CBC. -Mild thrombocytopenia likely in the setting of sepsis. Platelet count is improving. No bleeding noticed.  #Hypotension improved with IV fluid. Blood pressure acceptable today. Continue to monitor. Medications adjusted as below. Recommended to monitor blood pressure and may need to adjust medications at SNF.  I discussed with the patient's son and daughter at bedside at length regarding discharge planning medications. Also discussed with the social worker for safe discharge plan to SNF.  Discharge Diagnoses:  Principal Problem:   Sepsis ([place]) Active Problems:   Chronic kidney disease, stage III (moderate)   Chronic anticoagulation   Acute renal  failure (HCC)   UTI (urinary tract infection)   Mild malnutrition (HCC)   Anemia  Fall   Acute renal failure superimposed on chronic kidney disease [facility])    Discharge Instructions  Discharge Instructions    Call MD for:  difficulty breathing, headache or visual disturbances    Complete by:  As directed    Call MD for:  hives    Complete by:  As directed    Call MD for:  persistant dizziness or light-headedness    Complete by:  As directed    Call MD for:  persistant nausea and vomiting    Complete by:  As directed    Call MD for:  severe uncontrolled pain    Complete by:  As directed    Call MD for:  temperature >100.4    Complete by:  As directed    Diet - low sodium heart healthy    Complete by:  As directed    Dysphagia 3 (Mech soft);Thin liquid   Liquid Administration via: Cup;Straw Medication Administration: Whole meds with liquid Supervision: Staff to assist with self feeding;Full supervision/cueing for compensatory strategies Compensations: Slow rate Postural Changes: Seated upright at 90 degrees;Remain upright for at least 30 minutes after po intake   Discharge instructions    Complete by:  As directed    Please follow up final blood culture result. Please check CBC in 1 week.   Increase activity slowly    Complete by:  As directed      Allergies as of 11/12/2016      Reactions   Erythromycin       Medication List    STOP taking these medications   furosemide 20 MG tablet Commonly known as:  LASIX   lisinopril 10 MG tablet Commonly known as:  PRINIVIL,ZESTRIL   warfarin 2 MG tablet Commonly known as:  COUMADIN     TAKE these medications   cefadroxil 500 MG capsule Commonly known as:  DURICEF Take 1 capsule (500 mg total) by mouth daily.   donepezil 5 MG tablet Commonly known as:  ARICEPT Take 5 mg by mouth daily.   lactobacillus acidophilus & bulgar chewable tablet Chew 1 tablet by mouth 3 (three) times daily with meals.   loratadine 10  MG tablet Commonly known as:  CLARITIN Take 10 mg by mouth daily.   metoprolol tartrate 25 MG tablet Commonly known as:  LOPRESSOR Take 1 tablet by mouth daily.   nitroGLYCERIN 0.4 MG/SPRAY spray Commonly known as:  NITROLINGUAL Place 1 spray under the tongue every 5 (five) minutes as needed for chest pain.   pantoprazole 40 MG tablet Commonly known as:  PROTONIX Take 40 mg by mouth every evening.   simvastatin 20 MG tablet Commonly known as:  ZOCOR Take 1 tablet (20 mg total) by mouth at bedtime.       Contact information for follow-up providers    Maggie Font, MD. Schedule an appointment as soon as possible for a visit in 1 week(s).   Specialty:  Family Medicine Contact information: [address] [place] [address] [phone]            Contact information for after-discharge care    [facility] Follow up.   Specialty:  Skilled Nursing Facility Contact information: [address] [address] [phone]                 Allergies  Allergen Reactions  . Erythromycin     Consultations: None  Procedures/Studies: None  Subjective: Patient was  seen and examined at bedside. Patient denied chest pain, shortness of breath, nausea or vomiting. He looks clinically stable. Review of systems unreliable because of his dementia.  Discharge Exam: Vitals:   11/11/16 2226 11/12/16 0631  BP: 130/76 135/68  Pulse: 85 73  Resp: 20 18  Temp: 98.3 F (36.8 C) 98.5 F (36.9 C)   Vitals:   11/11/16 1713 11/11/16 2226 11/12/16 0631 11/12/16 1051  BP: (!) 121/47 130/76 135/68   Pulse: 70 85 73   Resp: 18 20 18    Temp: 98.1 F (36.7 C) 98.3 F (36.8 C) 98.5 F (36.9 C)   TempSrc: Oral Oral Oral   SpO2: 95% 96% 95% 92%  Weight:      Height:        General: Elderly male lying on bed comfortable. Cardiovascular: RRR, S1/S2 +, no rubs, no gallops Respiratory: CTA bilaterally, no  wheezing, no rhonchi Abdominal: Soft, NT, ND, bowel sounds + Extremities: no edema, no cyanosis Neurology: Alert awake and following simple commands.   The results of significant diagnostics from this hospitalization (including imaging, microbiology, ancillary and laboratory) are listed below for reference.     Microbiology: Recent Results (from the past 240 hour(s))  Urine culture     Status: Abnormal   Collection Time: 11/07/16  3:30 PM  Result Value Ref Range Status   Specimen Description URINE, CLEAN CATCH  Final   Special Requests NONE  Final   Culture MULTIPLE SPECIES PRESENT, SUGGEST RECOLLECTION (A)  Final   Report Status 11/09/2016 FINAL  Final  Blood Culture (routine x 2)     Status: Abnormal   Collection Time: 11/07/16  3:40 PM  Result Value Ref Range Status   Specimen Description BLOOD LEFT HAND  Final   Special Requests BOTTLES DRAWN AEROBIC ONLY 4CC  Final   Culture  Setup Time   Final    GRAM NEGATIVE RODS Gram Stain Report Called to,Read Back By and Verified With: BULLINS L. AT 1018A ON OS:[id number] BY THOMPSON S. AEROBIC BOTTLE ONLY    Culture (A)  Final    KLEBSIELLA PNEUMONIAE SUSCEPTIBILITIES PERFORMED ON PREVIOUS CULTURE WITHIN THE LAST 5 DAYS. Performed at [facility]    Report Status 11/10/2016 FINAL  Final  Blood Culture (routine x 2)     Status: Abnormal   Collection Time: 11/07/16  3:50 PM  Result Value Ref Range Status   Specimen Description BLOOD RIGHT ARM  Final   Special Requests BOTTLES DRAWN AEROBIC AND ANAEROBIC 6CC EACH  Final   Culture  Setup Time   Final    GRAM NEGATIVE RODS IN BOTH AEROBIC AND ANAEROBIC BOTTLES Gram Stain Report Called to,Read Back By and Verified With: HANDY T AT 0527 ON OS:[id number] BY FORSYTH K CRITICAL RESULT CALLED TO, READ BACK BY AND VERIFIED WITH: L SEAY,PHARMD AT 1035 11/08/16 BY L BENFIELD Performed at [place] (A)  Final   Report Status 11/10/2016 FINAL  Final    Organism ID, Bacteria KLEBSIELLA PNEUMONIAE  Final      Susceptibility   Klebsiella pneumoniae - MIC*    AMPICILLIN >=32 RESISTANT Resistant     CEFAZOLIN <=4 SENSITIVE Sensitive     CEFEPIME <=1 SENSITIVE Sensitive     CEFTAZIDIME <=1 SENSITIVE Sensitive     CEFTRIAXONE <=1 SENSITIVE Sensitive     CIPROFLOXACIN <=0.25 SENSITIVE Sensitive     GENTAMICIN <=1 SENSITIVE Sensitive     IMIPENEM <=0.25 SENSITIVE Sensitive  TRIMETH/SULFA <=20 SENSITIVE Sensitive     AMPICILLIN/SULBACTAM 4 SENSITIVE Sensitive     PIP/TAZO <=4 SENSITIVE Sensitive     Extended ESBL NEGATIVE Sensitive     * KLEBSIELLA PNEUMONIAE  Blood Culture ID Panel (Reflexed)     Status: Abnormal   Collection Time: 11/07/16  3:50 PM  Result Value Ref Range Status   Enterococcus species NOT DETECTED NOT DETECTED Final   Listeria monocytogenes NOT DETECTED NOT DETECTED Final   Staphylococcus species NOT DETECTED NOT DETECTED Final   Staphylococcus aureus NOT DETECTED NOT DETECTED Final   Streptococcus species NOT DETECTED NOT DETECTED Final   Streptococcus agalactiae NOT DETECTED NOT DETECTED Final   Streptococcus pneumoniae NOT DETECTED NOT DETECTED Final   Streptococcus pyogenes NOT DETECTED NOT DETECTED Final   Acinetobacter baumannii NOT DETECTED NOT DETECTED Final   Enterobacteriaceae species DETECTED (A) NOT DETECTED Final    Comment: CRITICAL RESULT CALLED TO, READ BACK BY AND VERIFIED WITH: L. Seay Pharm.D. 10:35 11/08/16 (wilsonm)    Enterobacter cloacae complex NOT DETECTED NOT DETECTED Final   Escherichia coli NOT DETECTED NOT DETECTED Final   Klebsiella oxytoca NOT DETECTED NOT DETECTED Final   Klebsiella pneumoniae DETECTED (A) NOT DETECTED Final    Comment: CRITICAL RESULT CALLED TO, READ BACK BY AND VERIFIED WITH: L. Seay Pharm.D. 10:35 11/08/16 ([email or web address])    Proteus species NOT DETECTED NOT DETECTED Final   Serratia marcescens NOT DETECTED NOT DETECTED Final   Carbapenem resistance NOT DETECTED NOT  DETECTED Final   Haemophilus influenzae NOT DETECTED NOT DETECTED Final   Neisseria meningitidis NOT DETECTED NOT DETECTED Final   Pseudomonas aeruginosa NOT DETECTED NOT DETECTED Final   Candida albicans NOT DETECTED NOT DETECTED Final   Candida glabrata NOT DETECTED NOT DETECTED Final   Candida krusei NOT DETECTED NOT DETECTED Final   Candida parapsilosis NOT DETECTED NOT DETECTED Final   Candida tropicalis NOT DETECTED NOT DETECTED Final    Comment: Performed at [facility]  Culture, blood (routine x 2)     Status: None (Preliminary result)   Collection Time: 11/10/16  8:42 AM  Result Value Ref Range Status   Specimen Description BLOOD RIGHT ANTECUBITAL  Final   Special Requests BOTTLES DRAWN AEROBIC AND ANAEROBIC 8CC  Final   Culture NO GROWTH 2 DAYS  Final   Report Status PENDING  Incomplete  Culture, blood (routine x 2)     Status: None (Preliminary result)   Collection Time: 11/10/16  8:50 AM  Result Value Ref Range Status   Specimen Description BLOOD LEFT ANTECUBITAL  Final   Special Requests BOTTLES DRAWN AEROBIC AND ANAEROBIC 8CC  Final   Culture NO GROWTH 2 DAYS  Final   Report Status PENDING  Incomplete     Labs: BNP (last 3 results) No results for input(s): BNP in the last 8760 hours. Basic Metabolic Panel:  Recent Labs Lab 11/08/16 0411 11/09/16 0731 11/10/16 0616 11/11/16 0547 11/12/16 0616  NA 141 139 139 138 138  K 3.9 3.7 4.1 4.3 4.2  CL 111 111 108 106 107  CO2 24 18* 25 27 27   GLUCOSE 134* 131* 108* 117* 92  BUN 61* 57* 53* 46* 42*  CREATININE 3.73* 3.11* 2.82* 2.61* 2.31*  CALCIUM 8.6* 8.2* 8.2* 8.3* 8.1*   Liver Function Tests:  Recent Labs Lab 11/07/16 1532  AST 65*  ALT 33  ALKPHOS 46  BILITOT 0.5  PROT 6.6  ALBUMIN 3.2*   No results for input(s): LIPASE, AMYLASE in the  last 168 hours. No results for input(s): AMMONIA in the last 168 hours. CBC:  Recent Labs Lab 11/07/16 1532 11/08/16 0411 11/09/16 0731 11/10/16 0616  11/11/16 0547 11/12/16 0616  WBC 11.9* 11.6* 7.6 8.8 8.8 8.0  NEUTROABS 10.1*  --   --   --   --   --   HGB 10.0* 9.0* 8.5* 7.7* 8.4* 7.9*  HCT 28.8* 26.1* 25.1* 22.5* 24.5* 22.3*  MCV 83.0 83.1 84.5 82.7 82.8 81.7  PLT 153 142* 120* 131* 142* 160   Cardiac Enzymes: No results for input(s): CKTOTAL, CKMB, CKMBINDEX, TROPONINI in the last 168 hours. BNP: Invalid input(s): POCBNP CBG:  Recent Labs Lab 11/11/16 0637 11/11/16 0739 11/11/16 1215 11/11/16 1650 11/12/16 0741  GLUCAP 95 92 114* 100* 100*   D-Dimer No results for input(s): DDIMER in the last 72 hours. Hgb A1c No results for input(s): HGBA1C in the last 72 hours. Lipid Profile No results for input(s): CHOL, HDL, LDLCALC, TRIG, CHOLHDL, LDLDIRECT in the last 72 hours. Thyroid function studies No results for input(s): TSH, T4TOTAL, T3FREE, THYROIDAB in the last 72 hours.  Invalid input(s): FREET3 Anemia work up No results for input(s): VITAMINB12, FOLATE, FERRITIN, TIBC, IRON, RETICCTPCT in the last 72 hours. Urinalysis    Component Value Date/Time   COLORURINE RED (A) 11/07/2016 1530   APPEARANCEUR CLEAR 11/07/2016 1530   APPEARANCEUR Cloudy (A) 04/29/2016 1216   LABSPEC 1.015 11/07/2016 1530   PHURINE 6.5 11/07/2016 1530   GLUCOSEU 250 (A) 11/07/2016 1530   HGBUR LARGE (A) 11/07/2016 1530   BILIRUBINUR NEGATIVE 11/07/2016 1530   BILIRUBINUR Negative 04/29/2016 1216   KETONESUR TRACE (A) 11/07/2016 1530   PROTEINUR >300 (A) 11/07/2016 1530   NITRITE POSITIVE (A) 11/07/2016 1530   LEUKOCYTESUR MODERATE (A) 11/07/2016 1530   LEUKOCYTESUR 1+ (A) 04/29/2016 1216   Sepsis Labs Invalid input(s): PROCALCITONIN,  WBC,  LACTICIDVEN Microbiology Recent Results (from the past 240 hour(s))  Urine culture     Status: Abnormal   Collection Time: 11/07/16  3:30 PM  Result Value Ref Range Status   Specimen Description URINE, CLEAN CATCH  Final   Special Requests NONE  Final   Culture MULTIPLE SPECIES PRESENT,  SUGGEST RECOLLECTION (A)  Final   Report Status 11/09/2016 FINAL  Final  Blood Culture (routine x 2)     Status: Abnormal   Collection Time: 11/07/16  3:40 PM  Result Value Ref Range Status   Specimen Description BLOOD LEFT HAND  Final   Special Requests BOTTLES DRAWN AEROBIC ONLY 4CC  Final   Culture  Setup Time   Final    GRAM NEGATIVE RODS Gram Stain Report Called to,Read Back By and Verified With: BULLINS L. AT 1018A ON OS:[id number] BY THOMPSON S. AEROBIC BOTTLE ONLY    Culture (A)  Final    KLEBSIELLA PNEUMONIAE SUSCEPTIBILITIES PERFORMED ON PREVIOUS CULTURE WITHIN THE LAST 5 DAYS. Performed at [facility]    Report Status 11/10/2016 FINAL  Final  Blood Culture (routine x 2)     Status: Abnormal   Collection Time: 11/07/16  3:50 PM  Result Value Ref Range Status   Specimen Description BLOOD RIGHT ARM  Final   Special Requests BOTTLES DRAWN AEROBIC AND ANAEROBIC [facility] EACH  Final   Culture  Setup Time   Final    GRAM NEGATIVE RODS IN BOTH AEROBIC AND ANAEROBIC BOTTLES Gram Stain Report Called to,Read Back By and Verified With: HANDY T AT 0527 ON OS:[id number] BY FORSYTH K CRITICAL RESULT CALLED  TO, READ BACK BY AND VERIFIED WITH: L SEAY,PHARMD AT 1035 11/08/16 BY L BENFIELD Performed at [place] (A)  Final   Report Status 11/10/2016 FINAL  Final   Organism ID, Bacteria KLEBSIELLA PNEUMONIAE  Final      Susceptibility   Klebsiella pneumoniae - MIC*    AMPICILLIN >=32 RESISTANT Resistant     CEFAZOLIN <=4 SENSITIVE Sensitive     CEFEPIME <=1 SENSITIVE Sensitive     CEFTAZIDIME <=1 SENSITIVE Sensitive     CEFTRIAXONE <=1 SENSITIVE Sensitive     CIPROFLOXACIN <=0.25 SENSITIVE Sensitive     GENTAMICIN <=1 SENSITIVE Sensitive     IMIPENEM <=0.25 SENSITIVE Sensitive     TRIMETH/SULFA <=20 SENSITIVE Sensitive     AMPICILLIN/SULBACTAM 4 SENSITIVE Sensitive     PIP/TAZO <=4 SENSITIVE Sensitive     Extended ESBL NEGATIVE Sensitive     *  KLEBSIELLA PNEUMONIAE  Blood Culture ID Panel (Reflexed)     Status: Abnormal   Collection Time: 11/07/16  3:50 PM  Result Value Ref Range Status   Enterococcus species NOT DETECTED NOT DETECTED Final   Listeria monocytogenes NOT DETECTED NOT DETECTED Final   Staphylococcus species NOT DETECTED NOT DETECTED Final   Staphylococcus aureus NOT DETECTED NOT DETECTED Final   Streptococcus species NOT DETECTED NOT DETECTED Final   Streptococcus agalactiae NOT DETECTED NOT DETECTED Final   Streptococcus pneumoniae NOT DETECTED NOT DETECTED Final   Streptococcus pyogenes NOT DETECTED NOT DETECTED Final   Acinetobacter baumannii NOT DETECTED NOT DETECTED Final   Enterobacteriaceae species DETECTED (A) NOT DETECTED Final    Comment: CRITICAL RESULT CALLED TO, READ BACK BY AND VERIFIED WITH: L. Seay Pharm.D. 10:35 11/08/16 ([email or web address])    Enterobacter cloacae complex NOT DETECTED NOT DETECTED Final   Escherichia coli NOT DETECTED NOT DETECTED Final   Klebsiella oxytoca NOT DETECTED NOT DETECTED Final   Klebsiella pneumoniae DETECTED (A) NOT DETECTED Final    Comment: CRITICAL RESULT CALLED TO, READ BACK BY AND VERIFIED WITH: L. Seay Pharm.D. 10:35 11/08/16 (wilsonm)    Proteus species NOT DETECTED NOT DETECTED Final   Serratia marcescens NOT DETECTED NOT DETECTED Final   Carbapenem resistance NOT DETECTED NOT DETECTED Final   Haemophilus influenzae NOT DETECTED NOT DETECTED Final   Neisseria meningitidis NOT DETECTED NOT DETECTED Final   Pseudomonas aeruginosa NOT DETECTED NOT DETECTED Final   Candida albicans NOT DETECTED NOT DETECTED Final   Candida glabrata NOT DETECTED NOT DETECTED Final   Candida krusei NOT DETECTED NOT DETECTED Final   Candida parapsilosis NOT DETECTED NOT DETECTED Final   Candida tropicalis NOT DETECTED NOT DETECTED Final    Comment: Performed at [facility]  Culture, blood (routine x 2)     Status: None (Preliminary result)   Collection Time: 11/10/16  8:42 AM   Result Value Ref Range Status   Specimen Description BLOOD RIGHT ANTECUBITAL  Final   Special Requests BOTTLES DRAWN AEROBIC AND ANAEROBIC 8CC  Final   Culture NO GROWTH 2 DAYS  Final   Report Status PENDING  Incomplete  Culture, blood (routine x 2)     Status: None (Preliminary result)   Collection Time: 11/10/16  8:50 AM  Result Value Ref Range Status   Specimen Description BLOOD LEFT ANTECUBITAL  Final   Special Requests BOTTLES DRAWN AEROBIC AND ANAEROBIC 8CC  Final   Culture NO GROWTH 2 DAYS  Final   Report Status PENDING  Incomplete     Time  coordinating discharge: Over 30 minutes  SIGNED:   Rosita Fire, MD  [facility] 11/12/2016, 11:42 AM  If 7PM-7AM, please contact night-coverage [email or web address] Password [email or web address]

## 2016-11-12 NOTE — Clinical Social Work Placement (Signed)
   CLINICAL SOCIAL WORK PLACEMENT  NOTE  Date:  11/12/2016  Patient Details  Name: Lance Grant MRN: [id number] Date of Birth: September 05, 1921  Clinical Social Work is seeking post-discharge placement for this patient at the [place] level of care (*CSW will initial, date and re-position this form in  chart as items are completed):  Yes   Patient/family provided with [place] Work Department's list of facilities offering this level of care within the geographic area requested by the patient (or if unable, by the patient's family).  Yes   Patient/family informed of their freedom to choose among providers that offer the needed level of care, that participate in Medicare, Medicaid or managed care program needed by the patient, have an available bed and are willing to accept the patient.  Yes   Patient/family informed of [facility]'s ownership interest in [facility] and [facility], as well as of the fact that they are under no obligation to receive care at these facilities.  PASRR submitted to EDS on 11/12/16     PASRR number received on 11/12/16     Existing PASRR number confirmed on       FL2 transmitted to all facilities in geographic area requested by pt/family on 11/12/16     FL2 transmitted to all facilities within larger geographic area on       Patient informed that his/her managed care company has contracts with or will negotiate with certain facilities, including the following:        Yes   Patient/family informed of bed offers received.  Patient chooses bed at [facility]     Physician recommends and patient chooses bed at      Patient to be transferred to [facility] on 11/12/16.  Patient to be transferred to facility by [facility] hospital staff     Patient family notified on 11/12/16 of transfer.  Name of family member notified:  Orlene Och     PHYSICIAN       Additional Comment:     _______________________________________________ Ihor Gully, LCSW 11/12/2016, 12:28 PM

## 2016-11-12 NOTE — NC FL2 (Signed)
[place] LEVEL OF CARE SCREENING TOOL     IDENTIFICATION  Patient Name: Lance Grant Birthdate: December 15, 1920 Sex: male Admission Date (Current Location): 11/07/2016  [facility] and [place] Number:  [employer] and Address:  [place] [address] [place]      Provider Number: [id number]  Attending Physician Name and Address:  Rosita Fire, MD  Relative Name and Phone Number:       Current Level of Care: Hospital Recommended Level of Care: [place] Prior Approval Number:    Date Approved/Denied:   PASRR Number: [id number] ([id number])  Discharge Plan: SNF    Current Diagnoses: Patient Active Problem List   Diagnosis Date Noted  . Acute renal failure superimposed on chronic kidney disease ([place])   . Sepsis ([place]) 11/07/2016  . Acute renal failure ([place]) 11/07/2016  . UTI (urinary tract infection) 11/07/2016  . Mild malnutrition ([place]) 11/07/2016  . Anemia 11/07/2016  . Fall 11/07/2016  . Lower extremity edema 04/03/2016  . Knee pain, right 12/27/2015  . Hyperkeratosis of nail 12/27/2015  . Encounter for therapeutic drug monitoring 12/01/2013  . Hypertension   . COPD (chronic obstructive pulmonary disease) ([place])   . Chronic kidney disease, stage III (moderate)   . GERD (gastroesophageal reflux disease)   . Hyperlipidemia   . Arteriosclerotic cardiovascular disease (ASCVD)   . Pulmonary embolism ([place])   . Chronic anticoagulation     Orientation RESPIRATION BLADDER Height & Weight     Self, Place  Normal Continent Weight: 143 lb 8.3 oz (65.1 kg) Height:  5\' 9"  (175.3 cm)  BEHAVIORAL SYMPTOMS/MOOD NEUROLOGICAL BOWEL NUTRITION STATUS      Continent Diet (DYS 3)  AMBULATORY STATUS COMMUNICATION OF NEEDS Skin   Extensive Assist Verbally Normal                       Personal Care Assistance Level of Assistance  Bathing, Feeding, Dressing Bathing Assistance: Maximum  assistance Feeding assistance: Independent Dressing Assistance: Maximum assistance     Functional Limitations Info  Sight, Hearing, Speech Sight Info: Adequate Hearing Info: Adequate Speech Info: Adequate    SPECIAL CARE FACTORS FREQUENCY  PT (By licensed PT)     PT Frequency: 5x/week              Contractures Contractures Info: Not present    Additional Factors Info  Code Status, Allergies Code Status Info: DNR Allergies Info: Erthyromycin           Current Medications (11/12/2016):  This is the current hospital active medication list Current Facility-Administered Medications  Medication Dose Route Frequency Provider Last Rate Last Dose  . acetaminophen (TYLENOL) tablet 650 mg  650 mg Oral Q6H PRN Karmen Bongo, MD   650 mg at 11/11/16 2120   Or  . acetaminophen (TYLENOL) suppository 650 mg  650 mg Rectal Q6H PRN Karmen Bongo, MD      . cefTRIAXone (ROCEPHIN) 2 g in dextrose 5 % 50 mL IVPB  2 g Intravenous Q24H Dron Tanna Furry, MD   2 g at 11/12/16 0947  . donepezil (ARICEPT) tablet 5 mg  5 mg Oral Daily Karmen Bongo, MD   5 mg at 11/12/16 0947  . feeding supplement (ENSURE ENLIVE) (ENSURE ENLIVE) liquid 237 mL  237 mL Oral BID BM Karmen Bongo, MD   237 mL at 11/12/16 0948  . lactated ringers infusion   Intravenous Continuous Dron Tanna Furry, MD 50  mL/hr at 11/12/16 0604    . loratadine (CLARITIN) tablet 10 mg  10 mg Oral Daily Karmen Bongo, MD   10 mg at 11/12/16 0947  . ondansetron (ZOFRAN) tablet 4 mg  4 mg Oral Q6H PRN Karmen Bongo, MD       Or  . ondansetron [facility]) injection 4 mg  4 mg Intravenous Q6H PRN Karmen Bongo, MD      . oxyCODONE (Oxy IR/ROXICODONE) immediate release tablet 5 mg  5 mg Oral Q6H PRN Dron Tanna Furry, MD   5 mg at 11/12/16 0947  . pantoprazole (PROTONIX) EC tablet 40 mg  40 mg Oral QPM Karmen Bongo, MD   40 mg at 11/11/16 1845  . simvastatin (ZOCOR) tablet 20 mg  20 mg Oral QHS Karmen Bongo, MD   20 mg at  11/11/16 2120  . sodium chloride flush (NS) 0.9 % injection 3 mL  3 mL Intravenous Q12H Karmen Bongo, MD   3 mL at 11/11/16 2200     Discharge Medications: Please see discharge summary for a list of discharge medications.  Relevant Imaging Results:  Relevant Lab Results:   Additional Information SSN [id number], Clydene Pugh, LCSW

## 2016-11-13 ENCOUNTER — Non-Acute Institutional Stay (SKILLED_NURSING_FACILITY): Payer: Medicare Other | Admitting: Internal Medicine

## 2016-11-13 ENCOUNTER — Encounter: Payer: Self-pay | Admitting: Internal Medicine

## 2016-11-13 DIAGNOSIS — R310 Gross hematuria: Secondary | ICD-10-CM | POA: Diagnosis not present

## 2016-11-13 DIAGNOSIS — E7800 Pure hypercholesterolemia, unspecified: Secondary | ICD-10-CM

## 2016-11-13 DIAGNOSIS — N3001 Acute cystitis with hematuria: Secondary | ICD-10-CM

## 2016-11-13 DIAGNOSIS — R600 Localized edema: Secondary | ICD-10-CM | POA: Diagnosis not present

## 2016-11-13 DIAGNOSIS — I10 Essential (primary) hypertension: Secondary | ICD-10-CM

## 2016-11-13 DIAGNOSIS — N184 Chronic kidney disease, stage 4 (severe): Secondary | ICD-10-CM | POA: Diagnosis not present

## 2016-11-13 DIAGNOSIS — N2889 Other specified disorders of kidney and ureter: Secondary | ICD-10-CM | POA: Diagnosis not present

## 2016-11-13 DIAGNOSIS — D631 Anemia in chronic kidney disease: Secondary | ICD-10-CM

## 2016-11-13 DIAGNOSIS — N179 Acute kidney failure, unspecified: Secondary | ICD-10-CM

## 2016-11-13 NOTE — Progress Notes (Signed)
Provider:  Veleta Miners Location:   [place] Room Number: 148/D Place of Service:  SNF (31)  PCP: Maggie Font, MD Patient Care Team: Iona Beard, MD as PCP - General (Family Medicine) Yehuda Savannah, MD (Cardiology)  Extended Emergency Contact Information Primary Emergency Contact: Cabral,Ernestine Address: [address]          [place], [place] [address] [place] Phone: [phone] Mobile Phone: [phone] Relation: Spouse Secondary Emergency Contact: Conley Simmonds [place] Work Phone: [phone] Relation: Son  Code Status: DNR Goals of Care: Advanced Directive information Advanced Directives 11/13/2016  Does Patient Have a Medical Advance Directive? Yes  Type of Advance Directive Out of facility DNR (pink MOST or yellow form)  Does patient want to make changes to medical advance directive? No - Patient declined  Copy of [place] in Chart? -  Would patient like information on creating a medical advance directive? -      Chief Complaint  Patient presents with  . New Admit To SNF    HPI: Patient is a 83 y.o. male seen today for admission to SNF for Therapy. Patient has H/O CAD s/p CABG, Hypertension, PVD, Hyperlipidemia, Chronic Renal Failure with Creat of 1.7 at baseline, COPD and left  Renal mass. And PE in 2007  It seems Patient [place] at home and then was brought by the family as he had Fever and was weak. He was found to have UTI and was treated for Urosepsis. His blood cultures were negative and he was switched to PO antibiotics and discharged to facility. He also had Acute renal failure with Creat peaking at 4.3. It did improve with Hydration and was down to 2.3 on discharge.  Patient was also on chronic anticoagulation.  he had been on coumadin for PE in 2007 and it was decided in the hospital with his recurrent fall and this renal mass which is causing him to have Hematuria  and Anemia that Coumadin should be discontinued at this time. Patient also has large Left renal mass which Bleeds and cause hematuria. Patient has refused further W/U for the mass and any treatment. Follows with Dr Alyson Ingles. Patient lives with his wife who is disable. But has caretaker and family support. He had no complains except that he is little Unbalanced. He also is upset that his wife is not is by herself at home. He wants to go home as soon as he can.  Past Medical History:  Diagnosis Date  . Arteriosclerotic cardiovascular disease (ASCVD)    CABG 10/2001 nl EF  . Chronic anticoagulation    Managed by [facility]  . Chronic kidney disease, stage III (moderate)    Creatinine-1.9 08/2005, 1.5 02/2007, 1.7 12/2007, 1.7 06/2008  . COPD (chronic obstructive pulmonary disease) ([place])   . GERD (gastroesophageal reflux disease)   . Hyperlipidemia   . Hypertension   . Leg swelling    chronic  . Peripheral vascular disease ([place])   . Pleural effusion   . Pulmonary embolism [facility]) 2007   2007  . Seminal vesiculitis   . Sinus bradycardia    When on beta blockers   Past Surgical History:  Procedure Laterality Date  . CORONARY ARTERY BYPASS GRAFT  10/2001  . TRANSURETHRAL RESECTION OF PROSTATE  07/2005    reports that he quit smoking about 29 years ago. He started smoking about 70 years ago. He has a 10.00 pack-year smoking history. He has never used smokeless  tobacco. He reports that he does not drink alcohol or use drugs. Social History   Social History  . Marital status: Married    Spouse name: N/A  . Number of children: 2  . Years of education: N/A   Occupational History  . Retired     [employer]  . [employer] for a total of 17 years; recently declined renominationb   Social History Main Topics  . Smoking status: Former Smoker    Packs/day: 1.00    Years: 10.00    Start date: 11/27/1946    Quit date: 11/05/1987  . Smokeless  tobacco: Never Used  . Alcohol use No     Comment: 1 can beer/day  . Drug use: No  . Sexual activity: Not on file   Other Topics Concern  . Not on file   Social History Narrative   Married   2 children, 2 grandchildren   No regular exercise    Functional Status Survey:    History reviewed. No pertinent family history.  Health Maintenance  Topic Date Due  . TETANUS/TDAP  11/27/1941  . ZOSTAVAX  11/27/1982  . INFLUENZA VACCINE  10/04/2017 (Originally 06/04/2016)  . PNA vac Low Risk Adult (1 of 2 - PCV13) 10/04/2017 (Originally 11/28/1987)    Allergies  Allergen Reactions  . Erythromycin     Allergies as of 11/13/2016      Reactions   Erythromycin       Medication List       Accurate as of 11/13/16 10:15 AM. Always use your most recent med list.          acetaminophen 325 MG tablet Commonly known as:  TYLENOL Take 325 mg by mouth every 6 (six) hours as needed.   cefadroxil 500 MG capsule Commonly known as:  DURICEF Take 1 capsule (500 mg total) by mouth daily.   donepezil 5 MG tablet Commonly known as:  ARICEPT Take 5 mg by mouth daily.   lactobacillus acidophilus & bulgar chewable tablet Chew 1 tablet by mouth 3 (three) times daily with meals.   loratadine 10 MG tablet Commonly known as:  CLARITIN Take 10 mg by mouth daily.   metoprolol tartrate 25 MG tablet Commonly known as:  LOPRESSOR Take 1 tablet by mouth daily.   nitroGLYCERIN 0.4 MG/SPRAY spray Commonly known as:  NITROLINGUAL Place 1 spray under the tongue every 5 (five) minutes x 3 doses as needed for chest pain.   pantoprazole 40 MG tablet Commonly known as:  PROTONIX Take 40 mg by mouth every evening.   simvastatin 20 MG tablet Commonly known as:  ZOCOR Take 1 tablet (20 mg total) by mouth at bedtime.       Review of Systems  Constitutional: Negative for activity change, appetite change, chills, fatigue and fever.  HENT: Positive for rhinorrhea. Negative for postnasal drip and  sore throat.   Respiratory: Negative for apnea, cough, choking, chest tightness and shortness of breath.   Cardiovascular: Negative for chest pain, palpitations and leg swelling.  Gastrointestinal: Negative for abdominal distention, abdominal pain, blood in stool, constipation, diarrhea, nausea and vomiting.  Genitourinary: Positive for hematuria. Negative for dysuria and frequency.  Musculoskeletal: Negative for arthralgias, back pain, gait problem and neck pain.  Skin: Negative for color change, pallor, rash and wound.  Neurological: Positive for weakness. Negative for dizziness, light-headedness and headaches.  Psychiatric/Behavioral: Negative for confusion, decreased concentration and sleep disturbance. The patient is  not nervous/anxious.     Vitals:   11/13/16 1014  BP: 124/82  Pulse: 90  Resp: 20  Temp: 98.6 F (37 C)  TempSrc: Oral   There is no height or weight on file to calculate BMI. Physical Exam  Constitutional: He appears well-developed and well-nourished.  HENT:  Head: Normocephalic.  Mouth/Throat: Oropharynx is clear and moist.  Eyes: Pupils are equal, round, and reactive to light.  Neck: Neck supple.  Cardiovascular: Normal rate, regular rhythm and normal heart sounds.   No murmur heard. Pulmonary/Chest: Effort normal and breath sounds normal. No respiratory distress. He has no wheezes. He has no rales. He exhibits no tenderness.  Abdominal: Soft. Bowel sounds are normal. He exhibits no distension. There is no tenderness. There is no rebound.  Musculoskeletal:  Had trace edema B/L  Lymphadenopathy:    He has no cervical adenopathy.  Neurological: He is alert.  Oriented to place was confused about the time. Thinks he has been here for 2 months. Had 3/5 Strength in UE and 3/5 in LE Balance was not checked.  Skin: Skin is warm and dry. No rash noted. No erythema. No pallor.  Psychiatric: He has a normal mood and affect. His behavior is normal. Judgment and  thought content normal.    Labs reviewed: Basic Metabolic Panel:  Recent Labs  11/10/16 0616 11/11/16 0547 11/12/16 0616  NA 139 138 138  K 4.1 4.3 4.2  CL 108 106 107  CO2 25 27 27   GLUCOSE 108* 117* 92  BUN 53* 46* 42*  CREATININE 2.82* 2.61* 2.31*  CALCIUM 8.2* 8.3* 8.1*   Liver Function Tests:  Recent Labs  04/29/16 1216 11/07/16 1532  AST 18 65*  ALT 13 33  ALKPHOS 54 46  BILITOT 0.3 0.5  PROT 6.5 6.6  ALBUMIN 4.2 3.2*   No results for input(s): LIPASE, AMYLASE in the last 8760 hours. No results for input(s): AMMONIA in the last 8760 hours. CBC:  Recent Labs  11/07/16 1532  11/10/16 0616 11/11/16 0547 11/12/16 0616  WBC 11.9*  < > 8.8 8.8 8.0  NEUTROABS 10.1*  --   --   --   --   HGB 10.0*  < > 7.7* 8.4* 7.9*  HCT 28.8*  < > 22.5* 24.5* 22.3*  MCV 83.0  < > 82.7 82.8 81.7  PLT 153  < > 131* 142* 160  < > = values in this interval not displayed. Cardiac Enzymes: No results for input(s): CKTOTAL, CKMB, CKMBINDEX, TROPONINI in the last 8760 hours. BNP: Invalid input(s): POCBNP No results found for: HGBA1C No results found for: TSH No results found for: VITAMINB12 No results found for: FOLATE No results found for: IRON, TIBC, FERRITIN  Imaging and Procedures obtained prior to SNF admission: Dg Chest Port 1 View  Result Date: 11/07/2016 CLINICAL DATA:  Fever.  Recent fall EXAM: PORTABLE CHEST 1 VIEW COMPARISON:  April 10, 2013 FINDINGS: There is no edema or consolidation. Heart is upper normal in size with pulmonary vascularity within normal limits. No adenopathy. Patient is status post internal mammary bypass grafting. No pneumothorax. No bone lesions. IMPRESSION: No edema or consolidation.  No evident pneumothorax. Electronically Signed   By: Lowella Grip III M.D.   On: 11/07/2016 16:29   Dg Femur Min 2 Views Right  Result Date: 11/07/2016 CLINICAL DATA:  Right lower extremity pain status post fall. EXAM: RIGHT FEMUR 2 VIEWS COMPARISON:  None.  FINDINGS: There is no evidence of fracture or other focal  bone lesions. There are 3 compartment osteoarthritic changes of the right knee, with a small right suprapatellar joint effusion. Moderate osteoarthritic changes of the right hip joint also noted. Vascular calcifications within the soft tissues. IMPRESSION: No acute fracture or dislocation identified about the right femur. Osteoarthritic changes of the right hip right knee. Electronically Signed   By: Fidela Salisbury M.D.   On: 11/07/2016 16:38    Assessment/Plan  Fever Due to UTI Patient to receive Cefadroxil for 2 weeks for Urosepsis. Patient is asymptomatic right now.  Acute renal failure superimposed on chronic kidney disease, Patient baseline Creat was 1.7 and it is now 2.3 which is trending down. Will continue to hold Lasix and Lisinopril. Continue to monitor Renal function.   Hematuria  Patient continue to have gross hematuria secondary to Renal Mass. Had long d/w patient again and he does not want any W/U for the Mass. He has been having hematuria now for many years. He is now off coumadin. Probably that can help stabilize his Hematuria.  Left renal mass As per patient he does not want surgery. Essential hypertension Was taken of Lisinopril and Lasix. Will continue to monitor BP.   Lower extremity edema Patient is off lasix. Will do daily weights. Continue to hold Lasix.  Anemia  Hgb Down to 7.9 Combination of Chronic renal disease and Hematuria. Will start on Iron Supplement Repeat in few days. Fall Due to UTI. Patient still unsteady. Start on Physical therapy. Patient says he wants to go home and be with his wife. Continues to be high risk for fall.   Family/ staff Communication:   Labs/tests ordered: CMP, CBC on 01/15 Total time spent in this patient care encounter was _45 minutes; greater than 50% of the visit spent counseling patient and D/W him about his disposition and coordinating care with Nurses  and therapist for problems addressed at this encounter.

## 2016-11-17 LAB — CULTURE, BLOOD (ROUTINE X 2)
Culture: NO GROWTH
Culture: NO GROWTH

## 2016-11-18 ENCOUNTER — Encounter (HOSPITAL_COMMUNITY)
Admission: RE | Admit: 2016-11-18 | Discharge: 2016-11-18 | Disposition: A | Discharge status: Home / Self Care | Payer: Medicare Other | Source: Skilled Nursing Facility | Attending: Internal Medicine | Admitting: Internal Medicine

## 2016-11-18 DIAGNOSIS — A419 Sepsis, unspecified organism: Secondary | ICD-10-CM | POA: Insufficient documentation

## 2016-11-18 LAB — COMPREHENSIVE METABOLIC PANEL
ALT: 96 U/L — AB (ref 17–63)
ANION GAP: 5 (ref 5–15)
AST: 46 U/L — ABNORMAL HIGH (ref 15–41)
Albumin: 2.8 g/dL — ABNORMAL LOW (ref 3.5–5.0)
Alkaline Phosphatase: 57 U/L (ref 38–126)
BUN: 29 mg/dL — ABNORMAL HIGH (ref 6–20)
CHLORIDE: 104 mmol/L (ref 101–111)
CO2: 29 mmol/L (ref 22–32)
CREATININE: 1.96 mg/dL — AB (ref 0.61–1.24)
Calcium: 9.1 mg/dL (ref 8.9–10.3)
GFR, EST AFRICAN AMERICAN: 32 mL/min — AB (ref >60)
GFR, EST NON AFRICAN AMERICAN: 28 mL/min — AB (ref >60)
Glucose, Bld: 104 mg/dL — ABNORMAL HIGH (ref 65–99)
POTASSIUM: 4.2 mmol/L (ref 3.5–5.1)
SODIUM: 138 mmol/L (ref 135–145)
Total Bilirubin: 0.6 mg/dL (ref 0.3–1.2)
Total Protein: 6.2 g/dL — ABNORMAL LOW (ref 6.5–8.1)

## 2016-11-18 LAB — CBC WITH DIFFERENTIAL/PLATELET
Basophils Absolute: 0 10*3/uL (ref 0.0–0.1)
Basophils Relative: 0 %
EOS ABS: 0.1 10*3/uL (ref 0.0–0.7)
EOS PCT: 1 %
HCT: 26.6 % — ABNORMAL LOW (ref 39.0–52.0)
Hemoglobin: 9.1 g/dL — ABNORMAL LOW (ref 13.0–17.0)
LYMPHS ABS: 1.6 10*3/uL (ref 0.7–4.0)
LYMPHS PCT: 17 %
MCH: 28.8 pg (ref 26.0–34.0)
MCHC: 34.2 g/dL (ref 30.0–36.0)
MCV: 84.2 fL (ref 78.0–100.0)
MONO ABS: 0.5 10*3/uL (ref 0.1–1.0)
MONOS PCT: 5 %
Neutro Abs: 7.2 10*3/uL (ref 1.7–7.7)
Neutrophils Relative %: 77 %
PLATELETS: 432 10*3/uL — AB (ref 150–400)
RBC: 3.16 MIL/uL — ABNORMAL LOW (ref 4.22–5.81)
RDW: 15.8 % — AB (ref 11.5–15.5)
WBC: 9.4 10*3/uL (ref 4.0–10.5)

## 2016-11-21 ENCOUNTER — Non-Acute Institutional Stay (SKILLED_NURSING_FACILITY): Payer: Medicare Other | Admitting: Internal Medicine

## 2016-11-21 ENCOUNTER — Encounter: Payer: Self-pay | Admitting: Internal Medicine

## 2016-11-21 DIAGNOSIS — D649 Anemia, unspecified: Secondary | ICD-10-CM

## 2016-11-21 DIAGNOSIS — N183 Chronic kidney disease, stage 3 unspecified: Secondary | ICD-10-CM

## 2016-11-21 DIAGNOSIS — M79661 Pain in right lower leg: Secondary | ICD-10-CM | POA: Diagnosis not present

## 2016-11-21 DIAGNOSIS — R310 Gross hematuria: Secondary | ICD-10-CM | POA: Diagnosis not present

## 2016-11-21 DIAGNOSIS — N3001 Acute cystitis with hematuria: Secondary | ICD-10-CM | POA: Diagnosis not present

## 2016-11-21 NOTE — Progress Notes (Signed)
Location:   [facility] Room Number: 148/D Place of Service:  SNF [phone]) Provider:  Gaylord Shih, MD  Patient Care Team: Iona Beard, MD as PCP - General (Family Medicine) Yehuda Savannah, MD (Cardiology)  Extended Emergency Contact Information Primary Emergency Contact: Grape,Ernestine Address: [address]          [place], [place] [address] [place]nette [place] of [place] Phone: [phone] Mobile Phone: [phone] Relation: Spouse Secondary Emergency Contact: Conley Simmonds [place] of [place] Work Phone: [phone] Relation: Son  Code Status:  DNR Goals of care: Advanced Directive information Advanced Directives 11/21/2016  Does Patient Have a Medical Advance Directive? Yes  Type of Advance Directive Out of facility DNR (pink MOST or yellow form)  Does patient want to make changes to medical advance directive? No - Patient declined  Copy of [place] in Chart? -  Would patient like information on creating a medical advance directive? -     Chief Complaint  Patient presents with  . Acute Visit    Right Calf Pain    HPI:  Pt is a 83 y.o. male seen today for an acute visit for Right Calf pain.  Patient has H/O CAD s/p CABG, Hypertension, PVD, Hyperlipidemia, Chronic Renal Failure with Creat of 1.7 at baseline, COPD and left  Renal mass. And PE in 2007.  He was admitted to hospital for Acute renal failure and UTI. He also was on coumadin which was d/ced in the hospital as patient continued to have hematuria due to renal mass. And there was no indication for Coumadin as his PE was in 2007. Patient also has refused for any work up for his renal mass. Patient was concerned as he had noticed some swelling and pain in his right calf. Family wanted to make sure he does not have clot as he is off coumadin.  Patient says today he does not have any pain and he came from therapy and did very well. He does has  mild swelling in that leg. His lasix was also stopped in the hospital due to ARF and dehydration. Patient says he has been on lasix for lower leg swelling. Patient weight is stable at 151 lbs. His Hematuria has also stopped since he has been off coumadin.  Past Medical History:  Diagnosis Date  . Arteriosclerotic cardiovascular disease (ASCVD)    CABG 10/2001 nl EF  . Chronic anticoagulation    Managed by [facility]  . Chronic kidney disease, stage III (moderate)    Creatinine-1.9 08/2005, 1.5 02/2007, 1.7 12/2007, 1.7 06/2008  . COPD (chronic obstructive pulmonary disease) ([place])   . GERD (gastroesophageal reflux disease)   . Hyperlipidemia   . Hypertension   . Leg swelling    chronic  . Peripheral vascular disease ([place])   . Pleural effusion   . Pulmonary embolism [facility]) 2007   2007  . Seminal vesiculitis   . Sinus bradycardia    When on beta blockers   Past Surgical History:  Procedure Laterality Date  . CORONARY ARTERY BYPASS GRAFT  10/2001  . TRANSURETHRAL RESECTION OF PROSTATE  07/2005    Allergies  Allergen Reactions  . Erythromycin     Allergies as of 11/21/2016      Reactions   Erythromycin       Medication List       Accurate as of 11/21/16 11:42 AM. Always use your most recent med list.  acetaminophen 325 MG tablet Commonly known as:  TYLENOL Take 325 mg by mouth every 6 (six) hours as needed.   cefadroxil 500 MG capsule Commonly known as:  DURICEF Take 1 capsule (500 mg total) by mouth daily.   donepezil 5 MG tablet Commonly known as:  ARICEPT Take 5 mg by mouth daily.   lactobacillus acidophilus & bulgar chewable tablet Chew 1 tablet by mouth 3 (three) times daily with meals.   loratadine 10 MG tablet Commonly known as:  CLARITIN Take 10 mg by mouth daily.   metoprolol tartrate 25 MG tablet Commonly known as:  LOPRESSOR Take 1 tablet by mouth daily.   nitroGLYCERIN 0.4 MG/SPRAY spray Commonly known as:  NITROLINGUAL Place 1 spray  under the tongue every 5 (five) minutes x 3 doses as needed for chest pain.   pantoprazole 40 MG tablet Commonly known as:  PROTONIX Take 40 mg by mouth every evening.   simvastatin 20 MG tablet Commonly known as:  ZOCOR Take 1 tablet (20 mg total) by mouth at bedtime.       Review of Systems  Constitutional: Negative for activity change, appetite change, chills, fatigue, fever and unexpected weight change.  HENT: Positive for rhinorrhea. Negative for postnasal drip, sinus pain and sore throat.   Respiratory: Negative for cough, chest tightness and shortness of breath.   Cardiovascular: Positive for leg swelling. Negative for chest pain and palpitations.  Gastrointestinal: Negative.   Genitourinary: Negative for dysuria, frequency and hematuria.  Musculoskeletal: Positive for gait problem. Negative for back pain and joint swelling.  Skin: Negative.   Neurological: Positive for weakness. Negative for dizziness, tremors, light-headedness and headaches.  Psychiatric/Behavioral: Positive for confusion and sleep disturbance. Negative for decreased concentration and dysphoric mood. The patient is not nervous/anxious.      There is no immunization history on file for this patient. Pertinent  Health Maintenance Due  Topic Date Due  . INFLUENZA VACCINE  10/04/2017 (Originally 06/04/2016)  . PNA vac Low Risk Adult (1 of 2 - PCV13) 10/04/2017 (Originally 11/28/1987)   No flowsheet data found. Functional Status Survey:    Vitals:   11/21/16 1138  BP: 131/71  Pulse: 76  Resp: 20  Temp: 98.1 F (36.7 C)  TempSrc: Oral   There is no height or weight on file to calculate BMI. Physical Exam  Constitutional: He appears well-developed and well-nourished.  HENT:  Head: Normocephalic.  Mouth/Throat: Oropharynx is clear and moist.  Eyes: Pupils are equal, round, and reactive to light.  Neck: Neck supple.  Cardiovascular: Normal rate, regular rhythm and normal heart sounds.     Pulmonary/Chest: Breath sounds normal. No respiratory distress. He has no wheezes. He has no rales.  Abdominal: Soft. Bowel sounds are normal. He exhibits no distension. There is no tenderness.  Musculoskeletal:  Bilateral Edema Right More then Left.  Lymphadenopathy:    He has no cervical adenopathy.  Neurological: He is alert.  Skin: Skin is warm and dry. No rash noted. No erythema.  Psychiatric: He has a normal mood and affect. His behavior is normal.    Labs reviewed:  Recent Labs  11/11/16 0547 11/12/16 0616 11/18/16 0730  NA 138 138 138  K 4.3 4.2 4.2  CL 106 107 104  CO2 27 27 29   GLUCOSE 117* 92 104*  BUN 46* 42* 29*  CREATININE 2.61* 2.31* 1.96*  CALCIUM 8.3* 8.1* 9.1    Recent Labs  04/29/16 1216 11/07/16 1532 11/18/16 0730  AST 18 65* 46*  ALT 13 33 96*  ALKPHOS 54 46 57  BILITOT 0.3 0.5 0.6  PROT 6.5 6.6 6.2*  ALBUMIN 4.2 3.2* 2.8*    Recent Labs  11/07/16 1532  11/11/16 0547 11/12/16 0616 11/18/16 0730  WBC 11.9*  < > 8.8 8.0 9.4  NEUTROABS 10.1*  --   --   --  7.2  HGB 10.0*  < > 8.4* 7.9* 9.1*  HCT 28.8*  < > 24.5* 22.3* 26.6*  MCV 83.0  < > 82.8 81.7 84.2  PLT 153  < > 142* 160 432*  < > = values in this interval not displayed. No results found for: TSH No results found for: HGBA1C Lab Results  Component Value Date   CHOL 162 09/15/2012   HDL 39 (L) 09/15/2012   LDLCALC 94 09/15/2012   TRIG 145 09/15/2012   CHOLHDL 4.2 09/15/2012    Significant Diagnostic Results in last 30 days:  Dg Chest Port 1 View  Result Date: 11/07/2016 CLINICAL DATA:  Fever.  Recent fall EXAM: PORTABLE CHEST 1 VIEW COMPARISON:  April 10, 2013 FINDINGS: There is no edema or consolidation. Heart is upper normal in size with pulmonary vascularity within normal limits. No adenopathy. Patient is status post internal mammary bypass grafting. No pneumothorax. No bone lesions. IMPRESSION: No edema or consolidation.  No evident pneumothorax. Electronically Signed   By:  Lowella Grip III M.D.   On: 11/07/2016 16:29   Dg Femur Min 2 Views Right  Result Date: 11/07/2016 CLINICAL DATA:  Right lower extremity pain status post fall. EXAM: RIGHT FEMUR 2 VIEWS COMPARISON:  None. FINDINGS: There is no evidence of fracture or other focal bone lesions. There are 3 compartment osteoarthritic changes of the right knee, with a small right suprapatellar joint effusion. Moderate osteoarthritic changes of the right hip joint also noted. Vascular calcifications within the soft tissues. IMPRESSION: No acute fracture or dislocation identified about the right femur. Osteoarthritic changes of the right hip right knee. Electronically Signed   By: Fidela Salisbury M.D.   On: 11/07/2016 16:38    Assessment/Plan  Right calf pain With Edema  Will get  doppler of right leg to r/o DVT as patient is off Coumadin. On reviewing his records patient has chronic edema. His lasix is on hold but weight is stable right now and he is Eu volemic. Will not restart Lasix yet. Continue to monitor clinically.  Urosepsis Continue Antisbiotics. Patient afebrile and asymptomatic  Hematuria, gross Due to renal mass. Actually it is resolved right now since his Coumadin was stopped.  Anemia Hgb has stabilized and is better now.  CRI Renal function stable now. Creat close to his baseline. Continue to hold lasix.  Family/ staff Communication:  Labs/tests ordered: BMP and CBC in 1 week. Doppler US of right leg.

## 2016-11-28 ENCOUNTER — Encounter (HOSPITAL_COMMUNITY)
Admission: RE | Admit: 2016-11-28 | Discharge: 2016-11-28 | Disposition: A | Discharge status: Home / Self Care | Payer: Medicare Other | Source: Skilled Nursing Facility | Attending: Internal Medicine | Admitting: Internal Medicine

## 2016-11-28 DIAGNOSIS — E441 Mild protein-calorie malnutrition: Secondary | ICD-10-CM | POA: Insufficient documentation

## 2016-11-28 DIAGNOSIS — E785 Hyperlipidemia, unspecified: Secondary | ICD-10-CM | POA: Insufficient documentation

## 2016-11-28 DIAGNOSIS — I2510 Atherosclerotic heart disease of native coronary artery without angina pectoris: Secondary | ICD-10-CM | POA: Insufficient documentation

## 2016-11-28 DIAGNOSIS — I10 Essential (primary) hypertension: Secondary | ICD-10-CM | POA: Insufficient documentation

## 2016-11-28 DIAGNOSIS — D649 Anemia, unspecified: Secondary | ICD-10-CM | POA: Insufficient documentation

## 2016-11-28 DIAGNOSIS — A419 Sepsis, unspecified organism: Secondary | ICD-10-CM | POA: Insufficient documentation

## 2016-11-28 LAB — BASIC METABOLIC PANEL
Anion gap: 6 (ref 5–15)
BUN: 35 mg/dL — AB (ref 6–20)
CHLORIDE: 102 mmol/L (ref 101–111)
CO2: 29 mmol/L (ref 22–32)
Calcium: 8.8 mg/dL — ABNORMAL LOW (ref 8.9–10.3)
Creatinine, Ser: 2.05 mg/dL — ABNORMAL HIGH (ref 0.61–1.24)
GFR calc Af Amer: 30 mL/min — ABNORMAL LOW (ref >60)
GFR calc non Af Amer: 26 mL/min — ABNORMAL LOW (ref >60)
GLUCOSE: 94 mg/dL (ref 65–99)
POTASSIUM: 4.4 mmol/L (ref 3.5–5.1)
SODIUM: 137 mmol/L (ref 135–145)

## 2016-11-28 LAB — CBC
HCT: 25.6 % — ABNORMAL LOW (ref 39.0–52.0)
HEMOGLOBIN: 8.7 g/dL — AB (ref 13.0–17.0)
MCH: 29.4 pg (ref 26.0–34.0)
MCHC: 34 g/dL (ref 30.0–36.0)
MCV: 86.5 fL (ref 78.0–100.0)
Platelets: 196 10*3/uL (ref 150–400)
RBC: 2.96 MIL/uL — AB (ref 4.22–5.81)
RDW: 16.6 % — ABNORMAL HIGH (ref 11.5–15.5)
WBC: 4.4 10*3/uL (ref 4.0–10.5)

## 2016-12-02 ENCOUNTER — Encounter (HOSPITAL_COMMUNITY)
Admission: RE | Admit: 2016-12-02 | Discharge: 2016-12-02 | Disposition: A | Discharge status: Home / Self Care | Payer: Medicare Other | Source: Skilled Nursing Facility | Attending: *Deleted | Admitting: *Deleted

## 2016-12-02 LAB — BASIC METABOLIC PANEL
Anion gap: 5 (ref 5–15)
BUN: 43 mg/dL — ABNORMAL HIGH (ref 6–20)
CO2: 31 mmol/L (ref 22–32)
Calcium: 9 mg/dL (ref 8.9–10.3)
Chloride: 102 mmol/L (ref 101–111)
Creatinine, Ser: 2.09 mg/dL — ABNORMAL HIGH (ref 0.61–1.24)
GFR calc Af Amer: 30 mL/min — ABNORMAL LOW (ref >60)
GFR calc non Af Amer: 26 mL/min — ABNORMAL LOW (ref >60)
Glucose, Bld: 87 mg/dL (ref 65–99)
Potassium: 4.6 mmol/L (ref 3.5–5.1)
Sodium: 138 mmol/L (ref 135–145)

## 2016-12-02 LAB — CBC
HEMATOCRIT: 25 % — AB (ref 39.0–52.0)
Hemoglobin: 8.5 g/dL — ABNORMAL LOW (ref 13.0–17.0)
MCH: 29.2 pg (ref 26.0–34.0)
MCHC: 34 g/dL (ref 30.0–36.0)
MCV: 85.9 fL (ref 78.0–100.0)
Platelets: 178 10*3/uL (ref 150–400)
RBC: 2.91 MIL/uL — ABNORMAL LOW (ref 4.22–5.81)
RDW: 16.5 % — AB (ref 11.5–15.5)
WBC: 4.1 10*3/uL (ref 4.0–10.5)

## 2016-12-05 ENCOUNTER — Encounter: Payer: Self-pay | Admitting: Internal Medicine

## 2016-12-05 ENCOUNTER — Non-Acute Institutional Stay (SKILLED_NURSING_FACILITY): Payer: Medicare Other | Admitting: Internal Medicine

## 2016-12-05 DIAGNOSIS — D649 Anemia, unspecified: Secondary | ICD-10-CM

## 2016-12-05 DIAGNOSIS — N3001 Acute cystitis with hematuria: Secondary | ICD-10-CM | POA: Diagnosis not present

## 2016-12-05 DIAGNOSIS — N183 Chronic kidney disease, stage 3 unspecified: Secondary | ICD-10-CM

## 2016-12-05 DIAGNOSIS — I10 Essential (primary) hypertension: Secondary | ICD-10-CM

## 2016-12-05 NOTE — Progress Notes (Signed)
Location:   [facility] Room Number: 144/P Place of Service:  SNF ([id number])  Provider: Granville Lewis  PCP: Maggie Font, MD Patient Care Team: Iona Beard, MD as PCP - General (Family Medicine) Yehuda Savannah, MD (Cardiology)  Extended Emergency Contact Information Primary Emergency Contact: Fehr,Ernestine Address: [address]          [place], [place] [address] [place] Phone: [phone] Mobile Phone: [phone] Relation: Spouse Secondary Emergency Contact: Conley Simmonds [place] of [place] Work Phone: [phone] Relation: Son  Code Status: DNR Goals of care:  Advanced Directive information Advanced Directives 12/05/2016  Does Patient Have a Medical Advance Directive? Yes  Type of Advance Directive Out of facility DNR (pink MOST or yellow form)  Does patient want to make changes to medical advance directive? No - Patient declined  Copy of [place] in Chart? -  Would patient like information on creating a medical advance directive? -     Allergies  Allergen Reactions  . Erythromycin     Chief Complaint  Patient presents with  . Discharge Note    HPI:  83 y.o. male  with a history of coronary artery disease status post CABG-hypertension-peripheral vascular disease hyperlipidemia chronic renal failure with baseline creatinine of 1.70 COPD and a left sided renal mass-she also had a pulmonary embolism 2007.  He was recently admitted to hospital for acute renal failure and UTI-UTI was treated with antibiotics.  Is also on Coumadin which was discontinued in the hospital because he continued to have hematuria this was thought secondary to the renal mass.  Patient has refused workup for the renal mass.  He he appears to have done well here and has gained strength in the facility he is very motivated go home-he apparently does have a disabled wide but has support at home.  During his stay here he  did complete a right calf pain but was negative for any DVT.  He is also it appears lost about 15 pounds-supplements have been started-he is quite adamant he is eating enough .  Regards to anemia he has been started on iron most recent hemoglobin was 8.5 on lab done on January 29 variability ranging from 7.7-- 9.1 most recently.  Renal function appears to be relatively baseline it is 2.09 on lab done on January 29 range had been recently 2.31 down to 1.96  Currently he has no complaints is looking forward to going home-      Past Medical History:  Diagnosis Date  . Arteriosclerotic cardiovascular disease (ASCVD)    CABG 10/2001 nl EF  . Chronic anticoagulation    Managed by [facility]  . Chronic kidney disease, stage III (moderate)    Creatinine-1.9 08/2005, 1.5 02/2007, 1.7 12/2007, 1.7 06/2008  . COPD (chronic obstructive pulmonary disease) ([place])   . GERD (gastroesophageal reflux disease)   . Hyperlipidemia   . Hypertension   . Leg swelling    chronic  . Peripheral vascular disease ([place])   . Pleural effusion   . Pulmonary embolism [facility]) 2007   2007  . Seminal vesiculitis   . Sinus bradycardia    When on beta blockers    Past Surgical History:  Procedure Laterality Date  . CORONARY ARTERY BYPASS GRAFT  10/2001  . TRANSURETHRAL RESECTION OF PROSTATE  07/2005      reports that he quit smoking about 29 years ago. He started smoking about 70 years ago. He has a 10.00 pack-year smoking  history. He has never used smokeless tobacco. He reports that he does not drink alcohol or use drugs. Social History   Social History  . Marital status: Married    Spouse name: N/A  . Number of children: 2  . Years of education: N/A   Occupational History  . Retired     [employer]  . [employer] for a total of 17 years; recently declined renominationb   Social History Main Topics  . Smoking status: Former Smoker    Packs/day: 1.00     Years: 10.00    Start date: 11/27/1946    Quit date: 11/05/1987  . Smokeless tobacco: Never Used  . Alcohol use No     Comment: 1 can beer/day  . Drug use: No  . Sexual activity: Not on file   Other Topics Concern  . Not on file   Social History Narrative   Married   2 children, 2 grandchildren   No regular exercise   Functional Status Survey:    Allergies  Allergen Reactions  . Erythromycin     Pertinent  Health Maintenance Due  Topic Date Due  . INFLUENZA VACCINE  10/04/2017 (Originally 06/04/2016)  . PNA vac Low Risk Adult (1 of 2 - PCV13) 10/04/2017 (Originally 11/28/1987)    Medications: Allergies as of 12/05/2016      Reactions   Erythromycin       Medication List       Accurate as of 12/05/16 12:13 PM. Always use your most recent med list.          acetaminophen 325 MG tablet Commonly known as:  TYLENOL Take 325 mg by mouth every 6 (six) hours as needed.   ACIDOPHILUS LACTOBACILLUS PO Give 1 tablet by mouth three times a day   donepezil 5 MG tablet Commonly known as:  ARICEPT Take 5 mg by mouth daily.   KP FERROUS SULFATE 325 (65 FE) MG tablet Generic drug:  ferrous sulfate Take 325 mg by mouth daily with breakfast.   loratadine 10 MG tablet Commonly known as:  CLARITIN Take 10 mg by mouth daily.   metoprolol tartrate 25 MG tablet Commonly known as:  LOPRESSOR Take 1 tablet by mouth daily.   nitroGLYCERIN 0.4 MG/SPRAY spray Commonly known as:  NITROLINGUAL Place 1 spray under the tongue every 5 (five) minutes x 3 doses as needed for chest pain.   pantoprazole 40 MG tablet Commonly known as:  PROTONIX Take 40 mg by mouth every evening.   simvastatin 20 MG tablet Commonly known as:  ZOCOR Take 1 tablet (20 mg total) by mouth at bedtime.       Review of Systems  In general is not complaining of any fever or chills says he feels well looking very much forward to going home.  Skin does not complain of rashes or itching.  Head ears eyes  nose mouth and throat does not complaining of sore throat or visual changes.  Resp-l is not complaining of any shortness breath or cough.  Cardiac does not complain of chest pain edema in the right calf appears to be resolved does not really have significant lower extremity edema.  GI is not complaining of abdominal discomfort nausea vomiting diarrhea or constipation.  GU does not complaining of dysuria.--Hematuria appears to have resolved  Musculoskeletal*complain of joint pain still has some lower extremity weakness.  Neurologic is not complaining of dizziness headache or syncope.  Psych  does have a diagnosis of dementia but this appears to be  mild she is pleasant and appropriate      Vitals:   12/05/16 1159  BP: 118/69  Pulse: 70  Resp: 16  Temp: 98.2 F (36.8 C)  TempSrc: Oral  SpO2: 92%  Weight: 135 lb 12.8 oz (61.6 kg)  Height: 5\' 9"  (1.753 m)    Physical Exam   In general this is a very pleasant elderly male who looks in her than his stated age.  Skin is warm and dry.  Eyes visual acuity appears grossly intact.  Oropharynx clear mucous membranes moist he is edentulous.  Chest is clear to auscultation there is no labored breathing.  Heart is regular rate and rhythm with an occasional irregular beat he does not really have significant lower extremity edema.  His abdomen is soft nontender with positive bowel sounds.  Musculoskeletal does move all extremities 4 of her extremities strength appears preserved still has weakness of his lower extremities he appears to have need of assistance when he tries to get up out of his wheelchair.  Neurologic is grossly intact his speech is clear no lateralizing findings.  Psych he is grossly alert and oriented but has periods of confusion he is pleasant appropriate and can carry on a conversation-.       Labs reviewed: Basic Metabolic Panel:  Recent Labs  11/18/16 0730 11/28/16 0610 12/02/16 0830  NA 138 137  138  K 4.2 4.4 4.6  CL 104 102 102  CO2 29 29 31   GLUCOSE 104* 94 87  BUN 29* 35* 43*  CREATININE 1.96* 2.05* 2.09*  CALCIUM 9.1 8.8* 9.0   Liver Function Tests:  Recent Labs  04/29/16 1216 11/07/16 1532 11/18/16 0730  AST 18 65* 46*  ALT 13 33 96*  ALKPHOS 54 46 57  BILITOT 0.3 0.5 0.6  PROT 6.5 6.6 6.2*  ALBUMIN 4.2 3.2* 2.8*   No results for input(s): LIPASE, AMYLASE in the last 8760 hours. No results for input(s): AMMONIA in the last 8760 hours. CBC:  Recent Labs  11/07/16 1532  11/18/16 0730 11/28/16 0610 12/02/16 0830  WBC 11.9*  < > 9.4 4.4 4.1  NEUTROABS 10.1*  --  7.2  --   --   HGB 10.0*  < > 9.1* 8.7* 8.5*  HCT 28.8*  < > 26.6* 25.6* 25.0*  MCV 83.0  < > 84.2 86.5 85.9  PLT 153  < > 432* 196 178  < > = values in this interval not displayed. Cardiac Enzymes: No results for input(s): CKTOTAL, CKMB, CKMBINDEX, TROPONINI in the last 8760 hours. BNP: Invalid input(s): POCBNP CBG:  Recent Labs  11/11/16 1215 11/11/16 1650 11/12/16 0741  GLUCAP 114* 100* 100*    Procedures and Imaging Studies During Stay: Dg Chest Port 1 View  Result Date: 11/07/2016 CLINICAL DATA:  Fever.  Recent fall EXAM: PORTABLE CHEST 1 VIEW COMPARISON:  April 10, 2013 FINDINGS: There is no edema or consolidation. Heart is upper normal in size with pulmonary vascularity within normal limits. No adenopathy. Patient is status post internal mammary bypass grafting. No pneumothorax. No bone lesions. IMPRESSION: No edema or consolidation.  No evident pneumothorax. Electronically Signed   By: Lowella Grip III M.D.   On: 11/07/2016 16:29   Dg Femur Min 2 Views Right  Result Date: 11/07/2016 CLINICAL DATA:  Right lower extremity pain status post fall. EXAM: RIGHT FEMUR 2 VIEWS COMPARISON:  None. FINDINGS: There is no evidence of fracture  or other focal bone lesions. There are 3 compartment osteoarthritic changes of the right knee, with a small right suprapatellar joint effusion. Moderate  osteoarthritic changes of the right hip joint also noted. Vascular calcifications within the soft tissues. IMPRESSION: No acute fracture or dislocation identified about the right femur. Osteoarthritic changes of the right hip right knee. Electronically Signed   By: Fidela Salisbury M.D.   On: 11/07/2016 16:38    Assessment/Plan:    #1 fever due to UTI-this appears to have resolved unremarkably he's completed antibiotic does not complain of dysuria fever chills.  #2 acute renal failure superimposed on chronic kidney disease creatinine of 2.09 is a bit above his baseline but appears to have been relatively stable during his stay here-will need follow up by primary care provider will order home health to recheck this next week and primary care provider notified of results.  #2 hematuria this appears to have resolved his hemoglobin has shown variability it was down 7.9 at one point thought to be a combination of chronic renal disease and hematuria.  He's been started on iron recent hemoglobins have shown some variability had been . as high as 9.1recently 8.5 on lab done January 29 will update this next week on health to draw notify primary care provider of results clinically he appears to be stable in this regards.  #3-history left renal mass again patient continues to refuse workup he does not want surgery.  #4 history of hypertension -- was taken off lisinopril Lasix in the hospital secondary to his renal issues-the last blood pressures appear to be stable most recently 118/69-134/68-the highest one that I see is [id number] but systolics in the 0000000 do not appear to be common. He continues on Lopressor 25 mg a day  #5 history of falls-this was thought initially due to UTI has worked of therapy has gained some strength but would benefit from continued PT and OT once he goes home.  #6 dementia this appears to be fairly mild he is on low-dose Aricept.   #7 history of hyperlipidemia he is on  simvastatin will defer follow-up to primary care provider since his stay here was quite short-I do note per review liver function tests were mildly elevated on January 15 with an AST of 46 ALT of 96-will update liver function tests when other labs are drawn next week by home health and primary care provider notified of results  Number  8-- a history of weight loss-patient is quite insistent he is eating enough-in fact his outside primary care provider actually apparently came to the facility to discuss this with him-will need primary care follow-up and it appears he will have good follow-up of this.--He is on supplements  [id number]-of note greater than 30 minutes spent on this discharge summary-greater than 50% of time spent coordinating plan of care for numerous diagnoses   Patient is being discharged with the following home health services:  PT and OT  In regards to durable medical: He will need a wheelchair since he still has significant weakness as noted above     Patient has been advised to f/u with their PCP in 1-2 weeks to bring them up to date on their rehab stay.  Social services at facility was responsible for arranging this appointment.  Pt was provided with a 30 day supply of prescriptions for medications and refills must be obtained from their PCP.  For controlled substances, a more limited supply may be provided adequate until PCP appointment only.  Future labs/tests needed:    CBC and CMP next week to be drawn by home health primary care provider notified of results

## 2016-12-08 DIAGNOSIS — N183 Chronic kidney disease, stage 3 (moderate): Secondary | ICD-10-CM | POA: Diagnosis not present

## 2016-12-08 DIAGNOSIS — J449 Chronic obstructive pulmonary disease, unspecified: Secondary | ICD-10-CM | POA: Diagnosis not present

## 2016-12-08 DIAGNOSIS — D649 Anemia, unspecified: Secondary | ICD-10-CM | POA: Diagnosis not present

## 2016-12-08 DIAGNOSIS — Z87440 Personal history of urinary (tract) infections: Secondary | ICD-10-CM | POA: Diagnosis not present

## 2016-12-08 DIAGNOSIS — Z9181 History of falling: Secondary | ICD-10-CM | POA: Diagnosis not present

## 2016-12-08 DIAGNOSIS — N2889 Other specified disorders of kidney and ureter: Secondary | ICD-10-CM | POA: Diagnosis not present

## 2016-12-08 DIAGNOSIS — I739 Peripheral vascular disease, unspecified: Secondary | ICD-10-CM | POA: Diagnosis not present

## 2016-12-08 DIAGNOSIS — I2510 Atherosclerotic heart disease of native coronary artery without angina pectoris: Secondary | ICD-10-CM | POA: Diagnosis not present

## 2016-12-08 DIAGNOSIS — I129 Hypertensive chronic kidney disease with stage 1 through stage 4 chronic kidney disease, or unspecified chronic kidney disease: Secondary | ICD-10-CM | POA: Diagnosis not present

## 2016-12-08 DIAGNOSIS — Z86711 Personal history of pulmonary embolism: Secondary | ICD-10-CM | POA: Diagnosis not present

## 2016-12-08 DIAGNOSIS — F0390 Unspecified dementia without behavioral disturbance: Secondary | ICD-10-CM | POA: Diagnosis not present

## 2016-12-08 DIAGNOSIS — M6281 Muscle weakness (generalized): Secondary | ICD-10-CM | POA: Diagnosis not present

## 2016-12-08 DIAGNOSIS — Z951 Presence of aortocoronary bypass graft: Secondary | ICD-10-CM | POA: Diagnosis not present

## 2016-12-09 ENCOUNTER — Other Ambulatory Visit (HOSPITAL_COMMUNITY)
Admission: RE | Admit: 2016-12-09 | Discharge: 2016-12-09 | Disposition: A | Discharge status: Home / Self Care | Payer: Medicare Other | Source: Other Acute Inpatient Hospital | Attending: Family Medicine | Admitting: Family Medicine

## 2016-12-09 DIAGNOSIS — I509 Heart failure, unspecified: Secondary | ICD-10-CM | POA: Diagnosis not present

## 2016-12-09 DIAGNOSIS — I129 Hypertensive chronic kidney disease with stage 1 through stage 4 chronic kidney disease, or unspecified chronic kidney disease: Secondary | ICD-10-CM | POA: Diagnosis not present

## 2016-12-09 DIAGNOSIS — N183 Chronic kidney disease, stage 3 (moderate): Secondary | ICD-10-CM | POA: Diagnosis not present

## 2016-12-09 DIAGNOSIS — M6281 Muscle weakness (generalized): Secondary | ICD-10-CM | POA: Diagnosis not present

## 2016-12-09 DIAGNOSIS — J449 Chronic obstructive pulmonary disease, unspecified: Secondary | ICD-10-CM | POA: Diagnosis not present

## 2016-12-09 DIAGNOSIS — I2510 Atherosclerotic heart disease of native coronary artery without angina pectoris: Secondary | ICD-10-CM | POA: Diagnosis not present

## 2016-12-09 DIAGNOSIS — F0390 Unspecified dementia without behavioral disturbance: Secondary | ICD-10-CM | POA: Diagnosis not present

## 2016-12-09 LAB — CBC WITH DIFFERENTIAL/PLATELET
Basophils Absolute: 0 10*3/uL (ref 0.0–0.1)
Basophils Relative: 0 %
EOS PCT: 1 %
Eosinophils Absolute: 0.1 10*3/uL (ref 0.0–0.7)
HCT: 28.7 % — ABNORMAL LOW (ref 39.0–52.0)
Hemoglobin: 9.8 g/dL — ABNORMAL LOW (ref 13.0–17.0)
LYMPHS ABS: 1.5 10*3/uL (ref 0.7–4.0)
LYMPHS PCT: 29 %
MCH: 29.6 pg (ref 26.0–34.0)
MCHC: 34.1 g/dL (ref 30.0–36.0)
MCV: 86.7 fL (ref 78.0–100.0)
MONO ABS: 0.4 10*3/uL (ref 0.1–1.0)
Monocytes Relative: 8 %
Neutro Abs: 3.3 10*3/uL (ref 1.7–7.7)
Neutrophils Relative %: 62 %
PLATELETS: 204 10*3/uL (ref 150–400)
RBC: 3.31 MIL/uL — ABNORMAL LOW (ref 4.22–5.81)
RDW: 15.9 % — AB (ref 11.5–15.5)
WBC: 5.3 10*3/uL (ref 4.0–10.5)

## 2016-12-09 LAB — BASIC METABOLIC PANEL
Anion gap: 7 (ref 5–15)
BUN: 43 mg/dL — AB (ref 6–20)
CHLORIDE: 102 mmol/L (ref 101–111)
CO2: 26 mmol/L (ref 22–32)
CREATININE: 2.45 mg/dL — AB (ref 0.61–1.24)
Calcium: 9 mg/dL (ref 8.9–10.3)
GFR calc Af Amer: 24 mL/min — ABNORMAL LOW (ref >60)
GFR, EST NON AFRICAN AMERICAN: 21 mL/min — AB (ref >60)
GLUCOSE: 142 mg/dL — AB (ref 65–99)
POTASSIUM: 4.8 mmol/L (ref 3.5–5.1)
Sodium: 135 mmol/L (ref 135–145)

## 2016-12-11 ENCOUNTER — Ambulatory Visit: Payer: Medicare Other | Admitting: Orthopaedic Surgery

## 2016-12-11 DIAGNOSIS — M6281 Muscle weakness (generalized): Secondary | ICD-10-CM | POA: Diagnosis not present

## 2016-12-11 DIAGNOSIS — F0390 Unspecified dementia without behavioral disturbance: Secondary | ICD-10-CM | POA: Diagnosis not present

## 2016-12-11 DIAGNOSIS — J449 Chronic obstructive pulmonary disease, unspecified: Secondary | ICD-10-CM | POA: Diagnosis not present

## 2016-12-11 DIAGNOSIS — I2510 Atherosclerotic heart disease of native coronary artery without angina pectoris: Secondary | ICD-10-CM | POA: Diagnosis not present

## 2016-12-11 DIAGNOSIS — I129 Hypertensive chronic kidney disease with stage 1 through stage 4 chronic kidney disease, or unspecified chronic kidney disease: Secondary | ICD-10-CM | POA: Diagnosis not present

## 2016-12-11 DIAGNOSIS — N183 Chronic kidney disease, stage 3 (moderate): Secondary | ICD-10-CM | POA: Diagnosis not present

## 2016-12-12 DIAGNOSIS — N183 Chronic kidney disease, stage 3 (moderate): Secondary | ICD-10-CM | POA: Diagnosis not present

## 2016-12-12 DIAGNOSIS — M6281 Muscle weakness (generalized): Secondary | ICD-10-CM | POA: Diagnosis not present

## 2016-12-12 DIAGNOSIS — F0390 Unspecified dementia without behavioral disturbance: Secondary | ICD-10-CM | POA: Diagnosis not present

## 2016-12-12 DIAGNOSIS — I129 Hypertensive chronic kidney disease with stage 1 through stage 4 chronic kidney disease, or unspecified chronic kidney disease: Secondary | ICD-10-CM | POA: Diagnosis not present

## 2016-12-12 DIAGNOSIS — J449 Chronic obstructive pulmonary disease, unspecified: Secondary | ICD-10-CM | POA: Diagnosis not present

## 2016-12-12 DIAGNOSIS — I2510 Atherosclerotic heart disease of native coronary artery without angina pectoris: Secondary | ICD-10-CM | POA: Diagnosis not present

## 2016-12-13 DIAGNOSIS — N183 Chronic kidney disease, stage 3 (moderate): Secondary | ICD-10-CM | POA: Diagnosis not present

## 2016-12-13 DIAGNOSIS — I2510 Atherosclerotic heart disease of native coronary artery without angina pectoris: Secondary | ICD-10-CM | POA: Diagnosis not present

## 2016-12-13 DIAGNOSIS — M6281 Muscle weakness (generalized): Secondary | ICD-10-CM | POA: Diagnosis not present

## 2016-12-13 DIAGNOSIS — F0390 Unspecified dementia without behavioral disturbance: Secondary | ICD-10-CM | POA: Diagnosis not present

## 2016-12-13 DIAGNOSIS — I129 Hypertensive chronic kidney disease with stage 1 through stage 4 chronic kidney disease, or unspecified chronic kidney disease: Secondary | ICD-10-CM | POA: Diagnosis not present

## 2016-12-13 DIAGNOSIS — J449 Chronic obstructive pulmonary disease, unspecified: Secondary | ICD-10-CM | POA: Diagnosis not present

## 2016-12-14 DIAGNOSIS — I10 Essential (primary) hypertension: Secondary | ICD-10-CM | POA: Diagnosis not present

## 2016-12-14 DIAGNOSIS — N390 Urinary tract infection, site not specified: Secondary | ICD-10-CM | POA: Diagnosis not present

## 2016-12-16 DIAGNOSIS — N390 Urinary tract infection, site not specified: Secondary | ICD-10-CM | POA: Diagnosis not present

## 2016-12-16 DIAGNOSIS — F0390 Unspecified dementia without behavioral disturbance: Secondary | ICD-10-CM | POA: Diagnosis not present

## 2016-12-16 DIAGNOSIS — N189 Chronic kidney disease, unspecified: Secondary | ICD-10-CM | POA: Diagnosis not present

## 2016-12-16 DIAGNOSIS — N183 Chronic kidney disease, stage 3 (moderate): Secondary | ICD-10-CM | POA: Diagnosis not present

## 2016-12-16 DIAGNOSIS — J449 Chronic obstructive pulmonary disease, unspecified: Secondary | ICD-10-CM | POA: Diagnosis not present

## 2016-12-16 DIAGNOSIS — M6281 Muscle weakness (generalized): Secondary | ICD-10-CM | POA: Diagnosis not present

## 2016-12-16 DIAGNOSIS — I2510 Atherosclerotic heart disease of native coronary artery without angina pectoris: Secondary | ICD-10-CM | POA: Diagnosis not present

## 2016-12-16 DIAGNOSIS — I129 Hypertensive chronic kidney disease with stage 1 through stage 4 chronic kidney disease, or unspecified chronic kidney disease: Secondary | ICD-10-CM | POA: Diagnosis not present

## 2016-12-18 ENCOUNTER — Ambulatory Visit (INDEPENDENT_AMBULATORY_CARE_PROVIDER_SITE_OTHER): Payer: Managed Care, Other (non HMO) | Admitting: Orthopaedic Surgery

## 2016-12-18 ENCOUNTER — Encounter: Payer: Self-pay | Admitting: Orthopaedic Surgery

## 2016-12-18 VITALS — BP 114/60 | HR 44 | Temp 98.2°F | Ht 69.0 in | Wt 115.0 lb

## 2016-12-18 DIAGNOSIS — J449 Chronic obstructive pulmonary disease, unspecified: Secondary | ICD-10-CM | POA: Diagnosis not present

## 2016-12-18 DIAGNOSIS — M6281 Muscle weakness (generalized): Secondary | ICD-10-CM | POA: Diagnosis not present

## 2016-12-18 DIAGNOSIS — I129 Hypertensive chronic kidney disease with stage 1 through stage 4 chronic kidney disease, or unspecified chronic kidney disease: Secondary | ICD-10-CM | POA: Diagnosis not present

## 2016-12-18 DIAGNOSIS — G8929 Other chronic pain: Secondary | ICD-10-CM

## 2016-12-18 DIAGNOSIS — M25561 Pain in right knee: Secondary | ICD-10-CM

## 2016-12-18 DIAGNOSIS — N183 Chronic kidney disease, stage 3 (moderate): Secondary | ICD-10-CM | POA: Diagnosis not present

## 2016-12-18 DIAGNOSIS — I2510 Atherosclerotic heart disease of native coronary artery without angina pectoris: Secondary | ICD-10-CM | POA: Diagnosis not present

## 2016-12-18 DIAGNOSIS — L608 Other nail disorders: Secondary | ICD-10-CM

## 2016-12-18 DIAGNOSIS — F0390 Unspecified dementia without behavioral disturbance: Secondary | ICD-10-CM | POA: Diagnosis not present

## 2016-12-18 NOTE — Progress Notes (Signed)
Patient [id number] Lance Grant, male DOB:April 22, 1921, 83 y.o. [id number]  Chief Complaint  Patient presents with  . Follow-up    right knee pain    HPI  Lance Grant is a 83 y.o. male who has chronic right knee pain.  He has no new trauma.  He has some swelling and popping.  He uses a walker.  He tries to be active.  He has nail problems of the feet. HPI  Body mass index is 16.98 kg/m.  ROS  Review of Systems  Constitutional:       Patient does not have Diabetes Mellitus. Patient has hypertension. Patient has COPD or shortness of breath. Patient does not have BMI > 35. Patient does not have current smoking history.   HENT: Negative for congestion.   Respiratory: Negative for cough and shortness of breath.   Cardiovascular: Negative for chest pain and leg swelling.       History of hypertension, Arteriosclerotic cardiovascular disease, peripheral vascular disease.  Endocrine: Positive for cold intolerance.  Musculoskeletal: Positive for arthralgias, back pain and gait problem.  Allergic/Immunologic: Positive for environmental allergies.  Neurological: Negative for numbness.    Past Medical History:  Diagnosis Date  . Arteriosclerotic cardiovascular disease (ASCVD)    CABG 10/2001 nl EF  . Chronic anticoagulation    Managed by [facility]  . Chronic kidney disease, stage III (moderate)    Creatinine-1.9 08/2005, 1.5 02/2007, 1.7 12/2007, 1.7 06/2008  . COPD (chronic obstructive pulmonary disease) ([place])   . GERD (gastroesophageal reflux disease)   . Hyperlipidemia   . Hypertension   . Leg swelling    chronic  . Peripheral vascular disease ([place])   . Pleural effusion   . Pulmonary embolism [facility]) 2007   2007  . Seminal vesiculitis   . Sinus bradycardia    When on beta blockers    Past Surgical History:  Procedure Laterality Date  . CORONARY ARTERY BYPASS GRAFT  10/2001  . TRANSURETHRAL RESECTION OF PROSTATE  07/2005    No family history on file.  Social  History Social History  Substance Use Topics  . Smoking status: Former Smoker    Packs/day: 1.00    Years: 10.00    Start date: 11/27/1946    Quit date: 11/05/1987  . Smokeless tobacco: Never Used  . Alcohol use No     Comment: 1 can beer/day    Allergies  Allergen Reactions  . Erythromycin     Current Outpatient Prescriptions  Medication Sig Dispense Refill  . acetaminophen (TYLENOL) 325 MG tablet Take 325 mg by mouth every 6 (six) hours as needed.    . ACIDOPHILUS LACTOBACILLUS PO Give 1 tablet by mouth three times a day    . donepezil (ARICEPT) 5 MG tablet Take 5 mg by mouth daily.    . ferrous sulfate (KP FERROUS SULFATE) 325 (65 FE) MG tablet Take 325 mg by mouth daily with breakfast.    . loratadine (CLARITIN) 10 MG tablet Take 10 mg by mouth daily.    . metoprolol tartrate (LOPRESSOR) 25 MG tablet Take 1 tablet by mouth daily.  1  . nitroGLYCERIN (NITROLINGUAL) 0.4 MG/SPRAY spray Place 1 spray under the tongue every 5 (five) minutes x 3 doses as needed for chest pain.    . pantoprazole (PROTONIX) 40 MG tablet Take 40 mg by mouth every evening.     . simvastatin (ZOCOR) 20 MG tablet Take 1 tablet (20 mg total) by mouth at bedtime. 90 tablet 1   No  current facility-administered medications for this visit.      Physical Exam  Blood pressure 114/60, pulse (!) 44, temperature 98.2 F (36.8 C), height 5\' 9"  (1.753 m), weight 115 lb (52.2 kg).  Constitutional: overall normal hygiene, normal nutrition, well developed, normal grooming, normal body habitus. Assistive device:walker  Musculoskeletal: gait and station Limp right, muscle tone and strength are normal, no tremors or atrophy is present.  .  Neurological: coordination overall normal.  Deep tendon reflex/nerve stretch intact.  Sensation normal.  Cranial nerves II-XII intact.   Skin:   Normal overall no scars, lesions, ulcers or rashes. No psoriasis.  Psychiatric: Alert and oriented x 3.  Recent memory intact, remote  memory unclear.  Normal mood and affect. Well groomed.  Good eye contact.  Cardiovascular: overall no swelling, no varicosities, no edema bilaterally, normal temperatures of the legs and arms, no clubbing, cyanosis and good capillary refill.  Lymphatic: palpation is normal.  The right lower extremity is examined:  Inspection:  Thigh:  Non-tender and no defects  Knee has swelling 1+ effusion.                        Joint tenderness is present                        Patient is tender over the medial joint line  Lower Leg:  Has normal appearance and no tenderness or defects  Ankle:  Non-tender and no defects  Foot:  Non-tender and no defects Range of Motion:  Knee:  Range of motion is: 0-100                        Crepitus is  present  Ankle:  Range of motion is normal. Strength and Tone:  The right lower extremity has normal strength and tone. Stability:  Knee:  The knee is stable.  Ankle:  The ankle is stable.    The patient has been educated about the nature of the problem(s) and counseled on treatment options.  The patient appeared to understand what I have discussed and is in agreement with it.  Encounter Diagnoses  Name Primary?  . Hyperkeratosis of nail Yes  . Chronic pain of right knee     PLAN Call if any problems.  Precautions discussed.  Continue current medications.   Return to clinic 2 months   I pared his nails of the feet while he was here.  Electronically Signed Sanjuana Kava, MD 2/14/201810:38 AM

## 2016-12-19 DIAGNOSIS — F0390 Unspecified dementia without behavioral disturbance: Secondary | ICD-10-CM | POA: Diagnosis not present

## 2016-12-19 DIAGNOSIS — I129 Hypertensive chronic kidney disease with stage 1 through stage 4 chronic kidney disease, or unspecified chronic kidney disease: Secondary | ICD-10-CM | POA: Diagnosis not present

## 2016-12-19 DIAGNOSIS — M6281 Muscle weakness (generalized): Secondary | ICD-10-CM | POA: Diagnosis not present

## 2016-12-19 DIAGNOSIS — J449 Chronic obstructive pulmonary disease, unspecified: Secondary | ICD-10-CM | POA: Diagnosis not present

## 2016-12-19 DIAGNOSIS — I2510 Atherosclerotic heart disease of native coronary artery without angina pectoris: Secondary | ICD-10-CM | POA: Diagnosis not present

## 2016-12-19 DIAGNOSIS — N183 Chronic kidney disease, stage 3 (moderate): Secondary | ICD-10-CM | POA: Diagnosis not present

## 2016-12-20 ENCOUNTER — Encounter: Payer: Self-pay | Admitting: Cardiovascular Disease

## 2016-12-20 ENCOUNTER — Ambulatory Visit (INDEPENDENT_AMBULATORY_CARE_PROVIDER_SITE_OTHER): Payer: Medicare Other | Admitting: Cardiovascular Disease

## 2016-12-20 VITALS — BP 92/54 | HR 58 | Ht 68.0 in | Wt 135.0 lb

## 2016-12-20 DIAGNOSIS — F0390 Unspecified dementia without behavioral disturbance: Secondary | ICD-10-CM | POA: Diagnosis not present

## 2016-12-20 DIAGNOSIS — I25810 Atherosclerosis of coronary artery bypass graft(s) without angina pectoris: Secondary | ICD-10-CM

## 2016-12-20 DIAGNOSIS — I10 Essential (primary) hypertension: Secondary | ICD-10-CM | POA: Diagnosis not present

## 2016-12-20 DIAGNOSIS — Z5181 Encounter for therapeutic drug level monitoring: Secondary | ICD-10-CM

## 2016-12-20 DIAGNOSIS — J449 Chronic obstructive pulmonary disease, unspecified: Secondary | ICD-10-CM | POA: Diagnosis not present

## 2016-12-20 DIAGNOSIS — M6281 Muscle weakness (generalized): Secondary | ICD-10-CM | POA: Diagnosis not present

## 2016-12-20 DIAGNOSIS — I2782 Chronic pulmonary embolism: Secondary | ICD-10-CM

## 2016-12-20 DIAGNOSIS — I2510 Atherosclerotic heart disease of native coronary artery without angina pectoris: Secondary | ICD-10-CM | POA: Diagnosis not present

## 2016-12-20 DIAGNOSIS — N183 Chronic kidney disease, stage 3 (moderate): Secondary | ICD-10-CM | POA: Diagnosis not present

## 2016-12-20 DIAGNOSIS — Z9289 Personal history of other medical treatment: Secondary | ICD-10-CM

## 2016-12-20 DIAGNOSIS — I129 Hypertensive chronic kidney disease with stage 1 through stage 4 chronic kidney disease, or unspecified chronic kidney disease: Secondary | ICD-10-CM | POA: Diagnosis not present

## 2016-12-20 DIAGNOSIS — E7800 Pure hypercholesterolemia, unspecified: Secondary | ICD-10-CM

## 2016-12-20 NOTE — Progress Notes (Signed)
SUBJECTIVE: The patient presents for early follow-up. He was hospitalized in January with persistent bleeding from a renal mass. He had sepsis due to Klebsiella bacteremia likely related to a urinary tract infection. Warfarin was discontinued due to persistent hematuria, anemia, and frequent falls. The physician discussed it with the patient's son and they were in agreement.  Hemoglobin 9.8 on 12/09/16.  Past medical history is significant for pulmonary embolism, CAD s/p CABG, essential hypertension, hyperlipidemia, CKD stage III, and GERD.   He denies chest pain, shortness of breath, leg swelling, and dizziness. He had a fall the night before his hospitalization but has not fallen since. He has had some dark stools and a Hemoccult is going to be checked.   Soc: He grew up in [place], and came to [facility] after he retired from the service. He was initially in the [employer], and then was in [employer]. He attended [employer]. 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   Review of Systems: As per "subjective", otherwise negative.  Allergies  Allergen Reactions  . Erythromycin     Current Outpatient Prescriptions  Medication Sig Dispense Refill  . acetaminophen (TYLENOL) 325 MG tablet Take 325 mg by mouth every 6 (six) hours as needed.    . ACIDOPHILUS LACTOBACILLUS PO Give 1 tablet by mouth three times a day    . donepezil (ARICEPT) 5 MG tablet Take 5 mg by mouth daily.    . ferrous sulfate (KP FERROUS SULFATE) 325 (65 FE) MG tablet Take 325 mg by mouth daily with breakfast.    . loratadine (CLARITIN) 10 MG tablet Take 10 mg by mouth daily.    . metoprolol tartrate (LOPRESSOR) 25 MG tablet Take 1 tablet by mouth daily.  1  . nitroGLYCERIN (NITROLINGUAL) 0.4 MG/SPRAY spray Place 1 spray under the tongue every 5 (five) minutes x 3 doses as needed for chest pain.    . pantoprazole (PROTONIX) 40 MG tablet Take  40 mg by mouth every evening.     . simvastatin (ZOCOR) 20 MG tablet Take 1 tablet (20 mg total) by mouth at bedtime. 90 tablet 1   No current facility-administered medications for this visit.     Past Medical History:  Diagnosis Date  . Arteriosclerotic cardiovascular disease (ASCVD)    CABG 10/2001 nl EF  . Chronic anticoagulation    Managed by [facility]  . Chronic kidney disease, stage III (moderate)    Creatinine-1.9 08/2005, 1.5 02/2007, 1.7 12/2007, 1.7 06/2008  . COPD (chronic obstructive pulmonary disease) ([place])   . GERD (gastroesophageal reflux disease)   . Hyperlipidemia   . Hypertension   . Leg swelling    chronic  . Peripheral vascular disease ([place])   . Pleural effusion   . Pulmonary embolism [facility]) 2007   2007  . Seminal vesiculitis   . Sinus bradycardia    When on beta blockers    Past Surgical History:  Procedure Laterality Date  . CORONARY ARTERY BYPASS GRAFT  10/2001  . TRANSURETHRAL RESECTION OF PROSTATE  07/2005    Social History   Social History  . Marital status: Married    Spouse name: N/A  . Number of children: 2  . Years of education: N/A   Occupational History  . Retired     [employer]  . [employer] for a  total of 17 years; recently declined renominationb   Social History Main Topics  . Smoking status: Former Smoker    Packs/day: 1.00    Years: 10.00    Start date: 11/27/1946    Quit date: 11/05/1987  . Smokeless tobacco: Never Used  . Alcohol use No     Comment: 1 can beer/day  . Drug use: No  . Sexual activity: Not on file   Other Topics Concern  . Not on file   Social History Narrative   Married   2 children, 2 grandchildren   No regular exercise     Vitals:   12/20/16 1605  BP: (!) 92/54  Pulse: (!) 58  SpO2: 98%  Weight: 135 lb (61.2 kg)  Height: 5\' 8"  (1.727 m)    PHYSICAL EXAM General: NAD. HEENT: Normal. Neck: No JVD, no thyromegaly. Lungs: Clear to  auscultation bilaterally with normal respiratory effort. CV: Nondisplaced PMI. Regular rate and rhythm, normal S1/S2, no [id number], soft 1/6 systolic murmur along left sternal border and RUSB. Trace periankle edema b/l. Abdomen: Soft, nontender, no distention.  Neurologic: Alert and oriented.  Psych: Normal affect. Skin: Normal.     ECG: Most recent ECG reviewed.      ASSESSMENT AND PLAN:  1. CAD s/p CABG: Symptomatically stable. Continue metoprolol and simvastatin.  2. Pulmonary embolism: No longer on warfarin due to persistent hematuria, anemia, and falls.   3. Essential HTN: Low normal. Asymptomatic. No changes.  4. Hyperlipidemia: On simvastatin 20 mg. No changes.  5. Lower extremity edema: No longer on Lasix prn. Did not tolerate compression stockings.  Dispo: f/u 6 months.  [name], M.D., F.A.C.C.

## 2016-12-20 NOTE — Patient Instructions (Signed)
Your physician wants you to follow-up in: 6 months with Dr. Koneswaran. You will receive a reminder letter in the mail two months in advance. If you don't receive a letter, please call our office to schedule the follow-up appointment.  Your physician recommends that you continue on your current medications as directed. Please refer to the Current Medication list given to you today.  Thank you for choosing [facility]!   

## 2016-12-23 DIAGNOSIS — I2510 Atherosclerotic heart disease of native coronary artery without angina pectoris: Secondary | ICD-10-CM | POA: Diagnosis not present

## 2016-12-23 DIAGNOSIS — J449 Chronic obstructive pulmonary disease, unspecified: Secondary | ICD-10-CM | POA: Diagnosis not present

## 2016-12-23 DIAGNOSIS — F0390 Unspecified dementia without behavioral disturbance: Secondary | ICD-10-CM | POA: Diagnosis not present

## 2016-12-23 DIAGNOSIS — M6281 Muscle weakness (generalized): Secondary | ICD-10-CM | POA: Diagnosis not present

## 2016-12-23 DIAGNOSIS — I129 Hypertensive chronic kidney disease with stage 1 through stage 4 chronic kidney disease, or unspecified chronic kidney disease: Secondary | ICD-10-CM | POA: Diagnosis not present

## 2016-12-23 DIAGNOSIS — N183 Chronic kidney disease, stage 3 (moderate): Secondary | ICD-10-CM | POA: Diagnosis not present

## 2016-12-24 DIAGNOSIS — M6281 Muscle weakness (generalized): Secondary | ICD-10-CM | POA: Diagnosis not present

## 2016-12-24 DIAGNOSIS — I129 Hypertensive chronic kidney disease with stage 1 through stage 4 chronic kidney disease, or unspecified chronic kidney disease: Secondary | ICD-10-CM | POA: Diagnosis not present

## 2016-12-24 DIAGNOSIS — J449 Chronic obstructive pulmonary disease, unspecified: Secondary | ICD-10-CM | POA: Diagnosis not present

## 2016-12-24 DIAGNOSIS — N183 Chronic kidney disease, stage 3 (moderate): Secondary | ICD-10-CM | POA: Diagnosis not present

## 2016-12-24 DIAGNOSIS — F0390 Unspecified dementia without behavioral disturbance: Secondary | ICD-10-CM | POA: Diagnosis not present

## 2016-12-24 DIAGNOSIS — I2510 Atherosclerotic heart disease of native coronary artery without angina pectoris: Secondary | ICD-10-CM | POA: Diagnosis not present

## 2016-12-26 DIAGNOSIS — I2510 Atherosclerotic heart disease of native coronary artery without angina pectoris: Secondary | ICD-10-CM | POA: Diagnosis not present

## 2016-12-26 DIAGNOSIS — N183 Chronic kidney disease, stage 3 (moderate): Secondary | ICD-10-CM | POA: Diagnosis not present

## 2016-12-26 DIAGNOSIS — M6281 Muscle weakness (generalized): Secondary | ICD-10-CM | POA: Diagnosis not present

## 2016-12-26 DIAGNOSIS — I129 Hypertensive chronic kidney disease with stage 1 through stage 4 chronic kidney disease, or unspecified chronic kidney disease: Secondary | ICD-10-CM | POA: Diagnosis not present

## 2016-12-26 DIAGNOSIS — F0390 Unspecified dementia without behavioral disturbance: Secondary | ICD-10-CM | POA: Diagnosis not present

## 2016-12-26 DIAGNOSIS — J449 Chronic obstructive pulmonary disease, unspecified: Secondary | ICD-10-CM | POA: Diagnosis not present

## 2016-12-27 DIAGNOSIS — F0390 Unspecified dementia without behavioral disturbance: Secondary | ICD-10-CM | POA: Diagnosis not present

## 2016-12-27 DIAGNOSIS — I129 Hypertensive chronic kidney disease with stage 1 through stage 4 chronic kidney disease, or unspecified chronic kidney disease: Secondary | ICD-10-CM | POA: Diagnosis not present

## 2016-12-27 DIAGNOSIS — J449 Chronic obstructive pulmonary disease, unspecified: Secondary | ICD-10-CM | POA: Diagnosis not present

## 2016-12-27 DIAGNOSIS — M6281 Muscle weakness (generalized): Secondary | ICD-10-CM | POA: Diagnosis not present

## 2016-12-27 DIAGNOSIS — N183 Chronic kidney disease, stage 3 (moderate): Secondary | ICD-10-CM | POA: Diagnosis not present

## 2016-12-27 DIAGNOSIS — I2510 Atherosclerotic heart disease of native coronary artery without angina pectoris: Secondary | ICD-10-CM | POA: Diagnosis not present

## 2016-12-30 ENCOUNTER — Other Ambulatory Visit (HOSPITAL_COMMUNITY)
Admission: RE | Admit: 2016-12-30 | Discharge: 2016-12-30 | Disposition: A | Discharge status: Home / Self Care | Payer: Medicare Other | Source: Other Acute Inpatient Hospital | Attending: Family Medicine | Admitting: Family Medicine

## 2016-12-30 DIAGNOSIS — D649 Anemia, unspecified: Secondary | ICD-10-CM | POA: Diagnosis not present

## 2016-12-30 DIAGNOSIS — J449 Chronic obstructive pulmonary disease, unspecified: Secondary | ICD-10-CM | POA: Diagnosis not present

## 2016-12-30 DIAGNOSIS — N183 Chronic kidney disease, stage 3 (moderate): Secondary | ICD-10-CM | POA: Diagnosis not present

## 2016-12-30 DIAGNOSIS — N390 Urinary tract infection, site not specified: Secondary | ICD-10-CM | POA: Insufficient documentation

## 2016-12-30 DIAGNOSIS — M6281 Muscle weakness (generalized): Secondary | ICD-10-CM | POA: Diagnosis not present

## 2016-12-30 DIAGNOSIS — I2510 Atherosclerotic heart disease of native coronary artery without angina pectoris: Secondary | ICD-10-CM | POA: Diagnosis not present

## 2016-12-30 DIAGNOSIS — I129 Hypertensive chronic kidney disease with stage 1 through stage 4 chronic kidney disease, or unspecified chronic kidney disease: Secondary | ICD-10-CM | POA: Diagnosis not present

## 2016-12-30 DIAGNOSIS — F0390 Unspecified dementia without behavioral disturbance: Secondary | ICD-10-CM | POA: Diagnosis not present

## 2016-12-30 LAB — URINALYSIS, ROUTINE W REFLEX MICROSCOPIC
Bilirubin Urine: NEGATIVE
GLUCOSE, UA: NEGATIVE mg/dL
Ketones, ur: NEGATIVE mg/dL
Nitrite: NEGATIVE
Specific Gravity, Urine: 1.015 (ref 1.005–1.030)
pH: 5 (ref 5.0–8.0)

## 2016-12-30 LAB — URINALYSIS, MICROSCOPIC (REFLEX)

## 2016-12-31 DIAGNOSIS — M6281 Muscle weakness (generalized): Secondary | ICD-10-CM | POA: Diagnosis not present

## 2016-12-31 DIAGNOSIS — J449 Chronic obstructive pulmonary disease, unspecified: Secondary | ICD-10-CM | POA: Diagnosis not present

## 2016-12-31 DIAGNOSIS — N183 Chronic kidney disease, stage 3 (moderate): Secondary | ICD-10-CM | POA: Diagnosis not present

## 2016-12-31 DIAGNOSIS — F0390 Unspecified dementia without behavioral disturbance: Secondary | ICD-10-CM | POA: Diagnosis not present

## 2016-12-31 DIAGNOSIS — I129 Hypertensive chronic kidney disease with stage 1 through stage 4 chronic kidney disease, or unspecified chronic kidney disease: Secondary | ICD-10-CM | POA: Diagnosis not present

## 2016-12-31 DIAGNOSIS — I2510 Atherosclerotic heart disease of native coronary artery without angina pectoris: Secondary | ICD-10-CM | POA: Diagnosis not present

## 2017-01-01 DIAGNOSIS — F0390 Unspecified dementia without behavioral disturbance: Secondary | ICD-10-CM | POA: Diagnosis not present

## 2017-01-01 DIAGNOSIS — M6281 Muscle weakness (generalized): Secondary | ICD-10-CM | POA: Diagnosis not present

## 2017-01-01 DIAGNOSIS — I129 Hypertensive chronic kidney disease with stage 1 through stage 4 chronic kidney disease, or unspecified chronic kidney disease: Secondary | ICD-10-CM | POA: Diagnosis not present

## 2017-01-01 DIAGNOSIS — J449 Chronic obstructive pulmonary disease, unspecified: Secondary | ICD-10-CM | POA: Diagnosis not present

## 2017-01-01 DIAGNOSIS — I2510 Atherosclerotic heart disease of native coronary artery without angina pectoris: Secondary | ICD-10-CM | POA: Diagnosis not present

## 2017-01-01 DIAGNOSIS — N183 Chronic kidney disease, stage 3 (moderate): Secondary | ICD-10-CM | POA: Diagnosis not present

## 2017-01-01 LAB — URINE CULTURE

## 2017-01-02 DIAGNOSIS — M6281 Muscle weakness (generalized): Secondary | ICD-10-CM | POA: Diagnosis not present

## 2017-01-02 DIAGNOSIS — N183 Chronic kidney disease, stage 3 (moderate): Secondary | ICD-10-CM | POA: Diagnosis not present

## 2017-01-02 DIAGNOSIS — J449 Chronic obstructive pulmonary disease, unspecified: Secondary | ICD-10-CM | POA: Diagnosis not present

## 2017-01-02 DIAGNOSIS — I129 Hypertensive chronic kidney disease with stage 1 through stage 4 chronic kidney disease, or unspecified chronic kidney disease: Secondary | ICD-10-CM | POA: Diagnosis not present

## 2017-01-02 DIAGNOSIS — F0390 Unspecified dementia without behavioral disturbance: Secondary | ICD-10-CM | POA: Diagnosis not present

## 2017-01-02 DIAGNOSIS — I2510 Atherosclerotic heart disease of native coronary artery without angina pectoris: Secondary | ICD-10-CM | POA: Diagnosis not present

## 2017-01-06 DIAGNOSIS — I2510 Atherosclerotic heart disease of native coronary artery without angina pectoris: Secondary | ICD-10-CM | POA: Diagnosis not present

## 2017-01-06 DIAGNOSIS — I129 Hypertensive chronic kidney disease with stage 1 through stage 4 chronic kidney disease, or unspecified chronic kidney disease: Secondary | ICD-10-CM | POA: Diagnosis not present

## 2017-01-06 DIAGNOSIS — F0390 Unspecified dementia without behavioral disturbance: Secondary | ICD-10-CM | POA: Diagnosis not present

## 2017-01-06 DIAGNOSIS — J449 Chronic obstructive pulmonary disease, unspecified: Secondary | ICD-10-CM | POA: Diagnosis not present

## 2017-01-06 DIAGNOSIS — N183 Chronic kidney disease, stage 3 (moderate): Secondary | ICD-10-CM | POA: Diagnosis not present

## 2017-01-06 DIAGNOSIS — M6281 Muscle weakness (generalized): Secondary | ICD-10-CM | POA: Diagnosis not present

## 2017-01-07 ENCOUNTER — Other Ambulatory Visit (HOSPITAL_COMMUNITY)
Admission: AD | Admit: 2017-01-07 | Discharge: 2017-01-07 | Disposition: A | Discharge status: Home / Self Care | Payer: Medicare Other | Source: Skilled Nursing Facility | Attending: Family Medicine | Admitting: Family Medicine

## 2017-01-07 DIAGNOSIS — F0390 Unspecified dementia without behavioral disturbance: Secondary | ICD-10-CM | POA: Diagnosis not present

## 2017-01-07 DIAGNOSIS — I129 Hypertensive chronic kidney disease with stage 1 through stage 4 chronic kidney disease, or unspecified chronic kidney disease: Secondary | ICD-10-CM | POA: Insufficient documentation

## 2017-01-07 DIAGNOSIS — I2510 Atherosclerotic heart disease of native coronary artery without angina pectoris: Secondary | ICD-10-CM | POA: Diagnosis not present

## 2017-01-07 DIAGNOSIS — J449 Chronic obstructive pulmonary disease, unspecified: Secondary | ICD-10-CM | POA: Diagnosis not present

## 2017-01-07 DIAGNOSIS — N183 Chronic kidney disease, stage 3 (moderate): Secondary | ICD-10-CM | POA: Diagnosis not present

## 2017-01-07 DIAGNOSIS — M6281 Muscle weakness (generalized): Secondary | ICD-10-CM | POA: Diagnosis not present

## 2017-01-07 LAB — CBC WITH DIFFERENTIAL/PLATELET
BASOS ABS: 0 10*3/uL (ref 0.0–0.1)
Basophils Relative: 0 %
EOS ABS: 0.1 10*3/uL (ref 0.0–0.7)
EOS PCT: 1 %
HCT: 26.9 % — ABNORMAL LOW (ref 39.0–52.0)
HEMOGLOBIN: 9.3 g/dL — AB (ref 13.0–17.0)
LYMPHS ABS: 1.5 10*3/uL (ref 0.7–4.0)
Lymphocytes Relative: 41 %
MCH: 30.2 pg (ref 26.0–34.0)
MCHC: 34.6 g/dL (ref 30.0–36.0)
MCV: 87.3 fL (ref 78.0–100.0)
Monocytes Absolute: 0.3 10*3/uL (ref 0.1–1.0)
Monocytes Relative: 8 %
NEUTROS PCT: 50 %
Neutro Abs: 1.8 10*3/uL (ref 1.7–7.7)
PLATELETS: 162 10*3/uL (ref 150–400)
RBC: 3.08 MIL/uL — AB (ref 4.22–5.81)
RDW: 15.7 % — ABNORMAL HIGH (ref 11.5–15.5)
WBC: 3.7 10*3/uL — AB (ref 4.0–10.5)

## 2017-01-14 DIAGNOSIS — N183 Chronic kidney disease, stage 3 (moderate): Secondary | ICD-10-CM | POA: Diagnosis not present

## 2017-01-14 DIAGNOSIS — J449 Chronic obstructive pulmonary disease, unspecified: Secondary | ICD-10-CM | POA: Diagnosis not present

## 2017-01-14 DIAGNOSIS — I129 Hypertensive chronic kidney disease with stage 1 through stage 4 chronic kidney disease, or unspecified chronic kidney disease: Secondary | ICD-10-CM | POA: Diagnosis not present

## 2017-01-14 DIAGNOSIS — M6281 Muscle weakness (generalized): Secondary | ICD-10-CM | POA: Diagnosis not present

## 2017-01-14 DIAGNOSIS — F0390 Unspecified dementia without behavioral disturbance: Secondary | ICD-10-CM | POA: Diagnosis not present

## 2017-01-14 DIAGNOSIS — I2510 Atherosclerotic heart disease of native coronary artery without angina pectoris: Secondary | ICD-10-CM | POA: Diagnosis not present

## 2017-01-17 DIAGNOSIS — N183 Chronic kidney disease, stage 3 (moderate): Secondary | ICD-10-CM | POA: Diagnosis not present

## 2017-01-17 DIAGNOSIS — E46 Unspecified protein-calorie malnutrition: Secondary | ICD-10-CM | POA: Diagnosis not present

## 2017-01-17 DIAGNOSIS — J449 Chronic obstructive pulmonary disease, unspecified: Secondary | ICD-10-CM | POA: Diagnosis not present

## 2017-01-17 DIAGNOSIS — R630 Anorexia: Secondary | ICD-10-CM | POA: Diagnosis not present

## 2017-01-17 DIAGNOSIS — I739 Peripheral vascular disease, unspecified: Secondary | ICD-10-CM | POA: Diagnosis not present

## 2017-01-17 DIAGNOSIS — I10 Essential (primary) hypertension: Secondary | ICD-10-CM | POA: Diagnosis not present

## 2017-01-17 DIAGNOSIS — I519 Heart disease, unspecified: Secondary | ICD-10-CM | POA: Diagnosis not present

## 2017-01-17 DIAGNOSIS — K219 Gastro-esophageal reflux disease without esophagitis: Secondary | ICD-10-CM | POA: Diagnosis not present

## 2017-01-17 DIAGNOSIS — E785 Hyperlipidemia, unspecified: Secondary | ICD-10-CM | POA: Diagnosis not present

## 2017-01-17 DIAGNOSIS — R531 Weakness: Secondary | ICD-10-CM | POA: Diagnosis not present

## 2017-01-20 ENCOUNTER — Ambulatory Visit: Payer: Self-pay | Admitting: *Deleted

## 2017-01-20 DIAGNOSIS — K219 Gastro-esophageal reflux disease without esophagitis: Secondary | ICD-10-CM | POA: Diagnosis not present

## 2017-01-20 DIAGNOSIS — Z5181 Encounter for therapeutic drug level monitoring: Secondary | ICD-10-CM

## 2017-01-20 DIAGNOSIS — J449 Chronic obstructive pulmonary disease, unspecified: Secondary | ICD-10-CM | POA: Diagnosis not present

## 2017-01-20 DIAGNOSIS — I519 Heart disease, unspecified: Secondary | ICD-10-CM | POA: Diagnosis not present

## 2017-01-20 DIAGNOSIS — R531 Weakness: Secondary | ICD-10-CM | POA: Diagnosis not present

## 2017-01-20 DIAGNOSIS — R630 Anorexia: Secondary | ICD-10-CM | POA: Diagnosis not present

## 2017-01-20 DIAGNOSIS — E46 Unspecified protein-calorie malnutrition: Secondary | ICD-10-CM | POA: Diagnosis not present

## 2017-01-23 DIAGNOSIS — R531 Weakness: Secondary | ICD-10-CM | POA: Diagnosis not present

## 2017-01-23 DIAGNOSIS — R630 Anorexia: Secondary | ICD-10-CM | POA: Diagnosis not present

## 2017-01-23 DIAGNOSIS — I519 Heart disease, unspecified: Secondary | ICD-10-CM | POA: Diagnosis not present

## 2017-01-23 DIAGNOSIS — E46 Unspecified protein-calorie malnutrition: Secondary | ICD-10-CM | POA: Diagnosis not present

## 2017-01-23 DIAGNOSIS — K219 Gastro-esophageal reflux disease without esophagitis: Secondary | ICD-10-CM | POA: Diagnosis not present

## 2017-01-23 DIAGNOSIS — J449 Chronic obstructive pulmonary disease, unspecified: Secondary | ICD-10-CM | POA: Diagnosis not present

## 2017-01-27 DIAGNOSIS — E46 Unspecified protein-calorie malnutrition: Secondary | ICD-10-CM | POA: Diagnosis not present

## 2017-01-27 DIAGNOSIS — J449 Chronic obstructive pulmonary disease, unspecified: Secondary | ICD-10-CM | POA: Diagnosis not present

## 2017-01-27 DIAGNOSIS — I519 Heart disease, unspecified: Secondary | ICD-10-CM | POA: Diagnosis not present

## 2017-01-27 DIAGNOSIS — R630 Anorexia: Secondary | ICD-10-CM | POA: Diagnosis not present

## 2017-01-27 DIAGNOSIS — R531 Weakness: Secondary | ICD-10-CM | POA: Diagnosis not present

## 2017-01-27 DIAGNOSIS — K219 Gastro-esophageal reflux disease without esophagitis: Secondary | ICD-10-CM | POA: Diagnosis not present

## 2017-01-30 DIAGNOSIS — R531 Weakness: Secondary | ICD-10-CM | POA: Diagnosis not present

## 2017-01-30 DIAGNOSIS — J449 Chronic obstructive pulmonary disease, unspecified: Secondary | ICD-10-CM | POA: Diagnosis not present

## 2017-01-30 DIAGNOSIS — R630 Anorexia: Secondary | ICD-10-CM | POA: Diagnosis not present

## 2017-01-30 DIAGNOSIS — K219 Gastro-esophageal reflux disease without esophagitis: Secondary | ICD-10-CM | POA: Diagnosis not present

## 2017-01-30 DIAGNOSIS — E46 Unspecified protein-calorie malnutrition: Secondary | ICD-10-CM | POA: Diagnosis not present

## 2017-01-30 DIAGNOSIS — I519 Heart disease, unspecified: Secondary | ICD-10-CM | POA: Diagnosis not present

## 2017-02-02 DIAGNOSIS — R531 Weakness: Secondary | ICD-10-CM | POA: Diagnosis not present

## 2017-02-02 DIAGNOSIS — R630 Anorexia: Secondary | ICD-10-CM | POA: Diagnosis not present

## 2017-02-02 DIAGNOSIS — E785 Hyperlipidemia, unspecified: Secondary | ICD-10-CM | POA: Diagnosis not present

## 2017-02-02 DIAGNOSIS — I739 Peripheral vascular disease, unspecified: Secondary | ICD-10-CM | POA: Diagnosis not present

## 2017-02-02 DIAGNOSIS — R69 Illness, unspecified: Secondary | ICD-10-CM | POA: Diagnosis not present

## 2017-02-02 DIAGNOSIS — E46 Unspecified protein-calorie malnutrition: Secondary | ICD-10-CM | POA: Diagnosis not present

## 2017-02-02 DIAGNOSIS — N183 Chronic kidney disease, stage 3 (moderate): Secondary | ICD-10-CM | POA: Diagnosis not present

## 2017-02-02 DIAGNOSIS — I519 Heart disease, unspecified: Secondary | ICD-10-CM | POA: Diagnosis not present

## 2017-02-02 DIAGNOSIS — J449 Chronic obstructive pulmonary disease, unspecified: Secondary | ICD-10-CM | POA: Diagnosis not present

## 2017-02-02 DIAGNOSIS — I10 Essential (primary) hypertension: Secondary | ICD-10-CM | POA: Diagnosis not present

## 2017-02-02 DIAGNOSIS — K219 Gastro-esophageal reflux disease without esophagitis: Secondary | ICD-10-CM | POA: Diagnosis not present

## 2017-02-04 DIAGNOSIS — J449 Chronic obstructive pulmonary disease, unspecified: Secondary | ICD-10-CM | POA: Diagnosis not present

## 2017-02-04 DIAGNOSIS — K219 Gastro-esophageal reflux disease without esophagitis: Secondary | ICD-10-CM | POA: Diagnosis not present

## 2017-02-04 DIAGNOSIS — R630 Anorexia: Secondary | ICD-10-CM | POA: Diagnosis not present

## 2017-02-04 DIAGNOSIS — E46 Unspecified protein-calorie malnutrition: Secondary | ICD-10-CM | POA: Diagnosis not present

## 2017-02-04 DIAGNOSIS — R531 Weakness: Secondary | ICD-10-CM | POA: Diagnosis not present

## 2017-02-04 DIAGNOSIS — I519 Heart disease, unspecified: Secondary | ICD-10-CM | POA: Diagnosis not present

## 2017-02-10 DIAGNOSIS — K219 Gastro-esophageal reflux disease without esophagitis: Secondary | ICD-10-CM | POA: Diagnosis not present

## 2017-02-10 DIAGNOSIS — R531 Weakness: Secondary | ICD-10-CM | POA: Diagnosis not present

## 2017-02-10 DIAGNOSIS — J449 Chronic obstructive pulmonary disease, unspecified: Secondary | ICD-10-CM | POA: Diagnosis not present

## 2017-02-10 DIAGNOSIS — E46 Unspecified protein-calorie malnutrition: Secondary | ICD-10-CM | POA: Diagnosis not present

## 2017-02-10 DIAGNOSIS — R630 Anorexia: Secondary | ICD-10-CM | POA: Diagnosis not present

## 2017-02-10 DIAGNOSIS — I519 Heart disease, unspecified: Secondary | ICD-10-CM | POA: Diagnosis not present

## 2017-02-11 DIAGNOSIS — D4102 Neoplasm of uncertain behavior of left kidney: Secondary | ICD-10-CM | POA: Diagnosis not present

## 2017-02-11 DIAGNOSIS — R319 Hematuria, unspecified: Secondary | ICD-10-CM | POA: Diagnosis not present

## 2017-02-13 DIAGNOSIS — R630 Anorexia: Secondary | ICD-10-CM | POA: Diagnosis not present

## 2017-02-13 DIAGNOSIS — E46 Unspecified protein-calorie malnutrition: Secondary | ICD-10-CM | POA: Diagnosis not present

## 2017-02-13 DIAGNOSIS — K219 Gastro-esophageal reflux disease without esophagitis: Secondary | ICD-10-CM | POA: Diagnosis not present

## 2017-02-13 DIAGNOSIS — I519 Heart disease, unspecified: Secondary | ICD-10-CM | POA: Diagnosis not present

## 2017-02-13 DIAGNOSIS — J449 Chronic obstructive pulmonary disease, unspecified: Secondary | ICD-10-CM | POA: Diagnosis not present

## 2017-02-13 DIAGNOSIS — R531 Weakness: Secondary | ICD-10-CM | POA: Diagnosis not present

## 2017-02-17 DIAGNOSIS — K219 Gastro-esophageal reflux disease without esophagitis: Secondary | ICD-10-CM | POA: Diagnosis not present

## 2017-02-17 DIAGNOSIS — R630 Anorexia: Secondary | ICD-10-CM | POA: Diagnosis not present

## 2017-02-17 DIAGNOSIS — R531 Weakness: Secondary | ICD-10-CM | POA: Diagnosis not present

## 2017-02-17 DIAGNOSIS — I519 Heart disease, unspecified: Secondary | ICD-10-CM | POA: Diagnosis not present

## 2017-02-17 DIAGNOSIS — E46 Unspecified protein-calorie malnutrition: Secondary | ICD-10-CM | POA: Diagnosis not present

## 2017-02-17 DIAGNOSIS — J449 Chronic obstructive pulmonary disease, unspecified: Secondary | ICD-10-CM | POA: Diagnosis not present

## 2017-02-19 ENCOUNTER — Encounter: Payer: Self-pay | Admitting: Orthopaedic Surgery

## 2017-02-19 ENCOUNTER — Ambulatory Visit (INDEPENDENT_AMBULATORY_CARE_PROVIDER_SITE_OTHER): Admitting: Orthopaedic Surgery

## 2017-02-19 VITALS — BP 128/79 | HR 72 | Temp 98.1°F | Ht 68.0 in | Wt 131.0 lb

## 2017-02-19 DIAGNOSIS — G8929 Other chronic pain: Secondary | ICD-10-CM | POA: Diagnosis not present

## 2017-02-19 DIAGNOSIS — L608 Other nail disorders: Secondary | ICD-10-CM

## 2017-02-19 DIAGNOSIS — M25561 Pain in right knee: Secondary | ICD-10-CM | POA: Diagnosis not present

## 2017-02-19 DIAGNOSIS — I10 Essential (primary) hypertension: Secondary | ICD-10-CM

## 2017-02-19 DIAGNOSIS — I25810 Atherosclerosis of coronary artery bypass graft(s) without angina pectoris: Secondary | ICD-10-CM | POA: Diagnosis not present

## 2017-02-19 NOTE — Progress Notes (Signed)
Patient Lance Grant, male DOB:May 24, 1921, 83 y.o. [id number]  Chief Complaint  Patient presents with  . Follow-up    right knee pain    HPI  GOERGE Grant is a 83 y.o. male who has chronic pain of the right knee. He has no new trauma.  He has some swelling but no giving way. He is using a walker.  His nails of his feet are long and I will pare them today.  His wife of 73 years died a few weeks ago.  He is depressed from this. HPI  Body mass index is 19.92 kg/m.  ROS  Review of Systems  Constitutional:       Patient does not have Diabetes Mellitus. Patient has hypertension. Patient has COPD or shortness of breath. Patient does not have BMI > 35. Patient does not have current smoking history.   HENT: Negative for congestion.   Respiratory: Negative for cough and shortness of breath.   Cardiovascular: Negative for chest pain and leg swelling.       History of hypertension, Arteriosclerotic cardiovascular disease, peripheral vascular disease.  Endocrine: Positive for cold intolerance.  Musculoskeletal: Positive for arthralgias, back pain and gait problem.  Allergic/Immunologic: Positive for environmental allergies.  Neurological: Negative for numbness.    Past Medical History:  Diagnosis Date  . Arteriosclerotic cardiovascular disease (ASCVD)    CABG 10/2001 nl EF  . Chronic anticoagulation    Managed by [facility]  . Chronic kidney disease, stage III (moderate)    Creatinine-1.9 08/2005, 1.5 02/2007, 1.7 12/2007, 1.7 06/2008  . COPD (chronic obstructive pulmonary disease) ([place])   . GERD (gastroesophageal reflux disease)   . Hyperlipidemia   . Hypertension   . Leg swelling    chronic  . Peripheral vascular disease ([place])   . Pleural effusion   . Pulmonary embolism [facility]) 2007   2007  . Seminal vesiculitis   . Sinus bradycardia    When on beta blockers    Past Surgical History:  Procedure Laterality Date  . CORONARY ARTERY BYPASS GRAFT  10/2001  .  TRANSURETHRAL RESECTION OF PROSTATE  07/2005    No family history on file.  Social History Social History  Substance Use Topics  . Smoking status: Former Smoker    Packs/day: 1.00    Years: 10.00    Start date: 11/27/1946    Quit date: 11/05/1987  . Smokeless tobacco: Never Used  . Alcohol use No     Comment: 1 can beer/day    Allergies  Allergen Reactions  . Erythromycin     Current Outpatient Prescriptions  Medication Sig Dispense Refill  . acetaminophen (TYLENOL) 325 MG tablet Take 325 mg by mouth every 6 (six) hours as needed.    . ACIDOPHILUS LACTOBACILLUS PO Give 1 tablet by mouth three times a day    . donepezil (ARICEPT) 5 MG tablet Take 5 mg by mouth daily.    . ferrous sulfate (KP FERROUS SULFATE) 325 (65 FE) MG tablet Take 325 mg by mouth daily with breakfast.    . loratadine (CLARITIN) 10 MG tablet Take 10 mg by mouth daily.    . metoprolol tartrate (LOPRESSOR) 25 MG tablet Take 1 tablet by mouth daily.  1  . nitroGLYCERIN (NITROLINGUAL) 0.4 MG/SPRAY spray Place 1 spray under the tongue every 5 (five) minutes x 3 doses as needed for chest pain.    . pantoprazole (PROTONIX) 40 MG tablet Take 40 mg by mouth every evening.     . simvastatin (  ZOCOR) 20 MG tablet Take 1 tablet (20 mg total) by mouth at bedtime. 90 tablet 1   No current facility-administered medications for this visit.      Physical Exam  Blood pressure 128/79, pulse 72, temperature 98.1 F (36.7 C), height 5\' 8"  (1.727 m), weight 131 lb (59.4 kg).  Constitutional: overall normal hygiene, normal nutrition, well developed, normal grooming, normal body habitus. Assistive device:walker  Musculoskeletal: gait and station Limp right, muscle tone and strength are normal, no tremors or atrophy is present.  .  Neurological: coordination overall normal.  Deep tendon reflex/nerve stretch intact.  Sensation normal.  Cranial nerves II-XII intact.   Skin:   Normal overall no scars, lesions, ulcers or rashes.  No psoriasis.  Psychiatric: Alert and oriented x 3.  Recent memory intact, remote memory unclear.  Normal mood and affect. Well groomed.  Good eye contact.  Cardiovascular: overall no swelling, no varicosities, no edema bilaterally, normal temperatures of the legs and arms, no clubbing, cyanosis and good capillary refill.  Lymphatic: palpation is normal.  The right lower extremity is examined:  Inspection:  Thigh:  Non-tender and no defects  Knee has swelling 1+ effusion.                        Joint tenderness is present                        Patient is tender over the medial joint line  Lower Leg:  Has normal appearance and no tenderness or defects  Ankle:  Non-tender and no defects  Foot:  Non-tender and no defects Range of Motion:  Knee:  Range of motion is: 0-100                        Crepitus is  present  Ankle:  Range of motion is normal. Strength and Tone:  The right lower extremity has normal strength and tone. Stability:  Knee:  The knee is stable.  Ankle:  The ankle is stable.    The patient has been educated about the nature of the problem(s) and counseled on treatment options.  The patient appeared to understand what I have discussed and is in agreement with it.  Encounter Diagnoses  Name Primary?  . Hyperkeratosis of nail Yes  . Chronic pain of right knee   . Essential hypertension     PLAN Call if any problems.  Precautions discussed.  Continue current medications.   Return to clinic 2 months   I pared his nails of his feet.  Electronically Signed Sanjuana Kava, MD 4/18/201810:22 AM

## 2017-02-20 ENCOUNTER — Telehealth: Payer: Self-pay | Admitting: Orthopaedic Surgery

## 2017-02-20 DIAGNOSIS — R531 Weakness: Secondary | ICD-10-CM | POA: Diagnosis not present

## 2017-02-20 DIAGNOSIS — J449 Chronic obstructive pulmonary disease, unspecified: Secondary | ICD-10-CM | POA: Diagnosis not present

## 2017-02-20 DIAGNOSIS — I519 Heart disease, unspecified: Secondary | ICD-10-CM | POA: Diagnosis not present

## 2017-02-20 DIAGNOSIS — E46 Unspecified protein-calorie malnutrition: Secondary | ICD-10-CM | POA: Diagnosis not present

## 2017-02-20 DIAGNOSIS — K219 Gastro-esophageal reflux disease without esophagitis: Secondary | ICD-10-CM | POA: Diagnosis not present

## 2017-02-20 DIAGNOSIS — R630 Anorexia: Secondary | ICD-10-CM | POA: Diagnosis not present

## 2017-02-20 NOTE — Telephone Encounter (Signed)
Noted  

## 2017-02-20 NOTE — Telephone Encounter (Signed)
Patient's caregiver, Daine Floras, at phone [phone] has called back with information as discussed at time of office visit 02/19/17 regarding the cortisone injections; states if patient is scheduled for an injection, his cardiologist would need to adjust his medications.

## 2017-02-26 DIAGNOSIS — K219 Gastro-esophageal reflux disease without esophagitis: Secondary | ICD-10-CM | POA: Diagnosis not present

## 2017-02-26 DIAGNOSIS — R531 Weakness: Secondary | ICD-10-CM | POA: Diagnosis not present

## 2017-02-26 DIAGNOSIS — E46 Unspecified protein-calorie malnutrition: Secondary | ICD-10-CM | POA: Diagnosis not present

## 2017-02-26 DIAGNOSIS — J449 Chronic obstructive pulmonary disease, unspecified: Secondary | ICD-10-CM | POA: Diagnosis not present

## 2017-02-26 DIAGNOSIS — I519 Heart disease, unspecified: Secondary | ICD-10-CM | POA: Diagnosis not present

## 2017-02-26 DIAGNOSIS — R630 Anorexia: Secondary | ICD-10-CM | POA: Diagnosis not present

## 2017-03-03 DIAGNOSIS — R630 Anorexia: Secondary | ICD-10-CM | POA: Diagnosis not present

## 2017-03-03 DIAGNOSIS — I519 Heart disease, unspecified: Secondary | ICD-10-CM | POA: Diagnosis not present

## 2017-03-03 DIAGNOSIS — K219 Gastro-esophageal reflux disease without esophagitis: Secondary | ICD-10-CM | POA: Diagnosis not present

## 2017-03-03 DIAGNOSIS — J449 Chronic obstructive pulmonary disease, unspecified: Secondary | ICD-10-CM | POA: Diagnosis not present

## 2017-03-03 DIAGNOSIS — E46 Unspecified protein-calorie malnutrition: Secondary | ICD-10-CM | POA: Diagnosis not present

## 2017-03-03 DIAGNOSIS — R531 Weakness: Secondary | ICD-10-CM | POA: Diagnosis not present

## 2017-03-04 DIAGNOSIS — R630 Anorexia: Secondary | ICD-10-CM | POA: Diagnosis not present

## 2017-03-04 DIAGNOSIS — R531 Weakness: Secondary | ICD-10-CM | POA: Diagnosis not present

## 2017-03-04 DIAGNOSIS — N184 Chronic kidney disease, stage 4 (severe): Secondary | ICD-10-CM | POA: Diagnosis not present

## 2017-03-04 DIAGNOSIS — K219 Gastro-esophageal reflux disease without esophagitis: Secondary | ICD-10-CM | POA: Diagnosis not present

## 2017-03-04 DIAGNOSIS — R310 Gross hematuria: Secondary | ICD-10-CM | POA: Diagnosis not present

## 2017-03-04 DIAGNOSIS — E46 Unspecified protein-calorie malnutrition: Secondary | ICD-10-CM | POA: Diagnosis not present

## 2017-03-04 DIAGNOSIS — I10 Essential (primary) hypertension: Secondary | ICD-10-CM | POA: Diagnosis not present

## 2017-03-04 DIAGNOSIS — N183 Chronic kidney disease, stage 3 (moderate): Secondary | ICD-10-CM | POA: Diagnosis not present

## 2017-03-04 DIAGNOSIS — M1990 Unspecified osteoarthritis, unspecified site: Secondary | ICD-10-CM | POA: Diagnosis not present

## 2017-03-04 DIAGNOSIS — Z6821 Body mass index (BMI) 21.0-21.9, adult: Secondary | ICD-10-CM | POA: Diagnosis not present

## 2017-03-04 DIAGNOSIS — E785 Hyperlipidemia, unspecified: Secondary | ICD-10-CM | POA: Diagnosis not present

## 2017-03-04 DIAGNOSIS — I739 Peripheral vascular disease, unspecified: Secondary | ICD-10-CM | POA: Diagnosis not present

## 2017-03-04 DIAGNOSIS — J449 Chronic obstructive pulmonary disease, unspecified: Secondary | ICD-10-CM | POA: Diagnosis not present

## 2017-03-04 DIAGNOSIS — I519 Heart disease, unspecified: Secondary | ICD-10-CM | POA: Diagnosis not present

## 2017-03-12 DIAGNOSIS — J449 Chronic obstructive pulmonary disease, unspecified: Secondary | ICD-10-CM | POA: Diagnosis not present

## 2017-03-12 DIAGNOSIS — K219 Gastro-esophageal reflux disease without esophagitis: Secondary | ICD-10-CM | POA: Diagnosis not present

## 2017-03-12 DIAGNOSIS — R531 Weakness: Secondary | ICD-10-CM | POA: Diagnosis not present

## 2017-03-12 DIAGNOSIS — I519 Heart disease, unspecified: Secondary | ICD-10-CM | POA: Diagnosis not present

## 2017-03-12 DIAGNOSIS — R630 Anorexia: Secondary | ICD-10-CM | POA: Diagnosis not present

## 2017-03-12 DIAGNOSIS — E46 Unspecified protein-calorie malnutrition: Secondary | ICD-10-CM | POA: Diagnosis not present

## 2017-03-18 DIAGNOSIS — E46 Unspecified protein-calorie malnutrition: Secondary | ICD-10-CM | POA: Diagnosis not present

## 2017-03-18 DIAGNOSIS — I519 Heart disease, unspecified: Secondary | ICD-10-CM | POA: Diagnosis not present

## 2017-03-18 DIAGNOSIS — K219 Gastro-esophageal reflux disease without esophagitis: Secondary | ICD-10-CM | POA: Diagnosis not present

## 2017-03-18 DIAGNOSIS — R630 Anorexia: Secondary | ICD-10-CM | POA: Diagnosis not present

## 2017-03-18 DIAGNOSIS — J449 Chronic obstructive pulmonary disease, unspecified: Secondary | ICD-10-CM | POA: Diagnosis not present

## 2017-03-18 DIAGNOSIS — R531 Weakness: Secondary | ICD-10-CM | POA: Diagnosis not present

## 2017-03-19 DIAGNOSIS — R531 Weakness: Secondary | ICD-10-CM | POA: Diagnosis not present

## 2017-03-19 DIAGNOSIS — R630 Anorexia: Secondary | ICD-10-CM | POA: Diagnosis not present

## 2017-03-19 DIAGNOSIS — E46 Unspecified protein-calorie malnutrition: Secondary | ICD-10-CM | POA: Diagnosis not present

## 2017-03-19 DIAGNOSIS — I519 Heart disease, unspecified: Secondary | ICD-10-CM | POA: Diagnosis not present

## 2017-03-19 DIAGNOSIS — K219 Gastro-esophageal reflux disease without esophagitis: Secondary | ICD-10-CM | POA: Diagnosis not present

## 2017-03-19 DIAGNOSIS — J449 Chronic obstructive pulmonary disease, unspecified: Secondary | ICD-10-CM | POA: Diagnosis not present

## 2017-03-20 DIAGNOSIS — I519 Heart disease, unspecified: Secondary | ICD-10-CM | POA: Diagnosis not present

## 2017-03-20 DIAGNOSIS — J449 Chronic obstructive pulmonary disease, unspecified: Secondary | ICD-10-CM | POA: Diagnosis not present

## 2017-03-20 DIAGNOSIS — R630 Anorexia: Secondary | ICD-10-CM | POA: Diagnosis not present

## 2017-03-20 DIAGNOSIS — E46 Unspecified protein-calorie malnutrition: Secondary | ICD-10-CM | POA: Diagnosis not present

## 2017-03-20 DIAGNOSIS — R531 Weakness: Secondary | ICD-10-CM | POA: Diagnosis not present

## 2017-03-20 DIAGNOSIS — K219 Gastro-esophageal reflux disease without esophagitis: Secondary | ICD-10-CM | POA: Diagnosis not present

## 2017-03-21 DIAGNOSIS — R630 Anorexia: Secondary | ICD-10-CM | POA: Diagnosis not present

## 2017-03-21 DIAGNOSIS — E46 Unspecified protein-calorie malnutrition: Secondary | ICD-10-CM | POA: Diagnosis not present

## 2017-03-21 DIAGNOSIS — J449 Chronic obstructive pulmonary disease, unspecified: Secondary | ICD-10-CM | POA: Diagnosis not present

## 2017-03-21 DIAGNOSIS — R531 Weakness: Secondary | ICD-10-CM | POA: Diagnosis not present

## 2017-03-21 DIAGNOSIS — I519 Heart disease, unspecified: Secondary | ICD-10-CM | POA: Diagnosis not present

## 2017-03-21 DIAGNOSIS — K219 Gastro-esophageal reflux disease without esophagitis: Secondary | ICD-10-CM | POA: Diagnosis not present

## 2017-03-24 DIAGNOSIS — I519 Heart disease, unspecified: Secondary | ICD-10-CM | POA: Diagnosis not present

## 2017-03-24 DIAGNOSIS — R630 Anorexia: Secondary | ICD-10-CM | POA: Diagnosis not present

## 2017-03-24 DIAGNOSIS — K219 Gastro-esophageal reflux disease without esophagitis: Secondary | ICD-10-CM | POA: Diagnosis not present

## 2017-03-24 DIAGNOSIS — R531 Weakness: Secondary | ICD-10-CM | POA: Diagnosis not present

## 2017-03-24 DIAGNOSIS — J449 Chronic obstructive pulmonary disease, unspecified: Secondary | ICD-10-CM | POA: Diagnosis not present

## 2017-03-24 DIAGNOSIS — E46 Unspecified protein-calorie malnutrition: Secondary | ICD-10-CM | POA: Diagnosis not present

## 2017-03-27 ENCOUNTER — Ambulatory Visit: Admitting: Orthopaedic Surgery

## 2017-03-28 DIAGNOSIS — I519 Heart disease, unspecified: Secondary | ICD-10-CM | POA: Diagnosis not present

## 2017-03-28 DIAGNOSIS — E46 Unspecified protein-calorie malnutrition: Secondary | ICD-10-CM | POA: Diagnosis not present

## 2017-03-28 DIAGNOSIS — J449 Chronic obstructive pulmonary disease, unspecified: Secondary | ICD-10-CM | POA: Diagnosis not present

## 2017-03-28 DIAGNOSIS — R630 Anorexia: Secondary | ICD-10-CM | POA: Diagnosis not present

## 2017-03-28 DIAGNOSIS — R531 Weakness: Secondary | ICD-10-CM | POA: Diagnosis not present

## 2017-03-28 DIAGNOSIS — K219 Gastro-esophageal reflux disease without esophagitis: Secondary | ICD-10-CM | POA: Diagnosis not present

## 2017-04-01 DIAGNOSIS — R630 Anorexia: Secondary | ICD-10-CM | POA: Diagnosis not present

## 2017-04-01 DIAGNOSIS — R531 Weakness: Secondary | ICD-10-CM | POA: Diagnosis not present

## 2017-04-01 DIAGNOSIS — K219 Gastro-esophageal reflux disease without esophagitis: Secondary | ICD-10-CM | POA: Diagnosis not present

## 2017-04-01 DIAGNOSIS — I519 Heart disease, unspecified: Secondary | ICD-10-CM | POA: Diagnosis not present

## 2017-04-01 DIAGNOSIS — J449 Chronic obstructive pulmonary disease, unspecified: Secondary | ICD-10-CM | POA: Diagnosis not present

## 2017-04-01 DIAGNOSIS — E46 Unspecified protein-calorie malnutrition: Secondary | ICD-10-CM | POA: Diagnosis not present

## 2017-04-02 ENCOUNTER — Encounter: Payer: Self-pay | Admitting: Orthopaedic Surgery

## 2017-04-02 ENCOUNTER — Ambulatory Visit (INDEPENDENT_AMBULATORY_CARE_PROVIDER_SITE_OTHER): Admitting: Orthopaedic Surgery

## 2017-04-02 DIAGNOSIS — I10 Essential (primary) hypertension: Secondary | ICD-10-CM

## 2017-04-02 DIAGNOSIS — G8929 Other chronic pain: Secondary | ICD-10-CM | POA: Diagnosis not present

## 2017-04-02 DIAGNOSIS — M25561 Pain in right knee: Secondary | ICD-10-CM | POA: Diagnosis not present

## 2017-04-02 DIAGNOSIS — I2510 Atherosclerotic heart disease of native coronary artery without angina pectoris: Secondary | ICD-10-CM

## 2017-04-02 DIAGNOSIS — L608 Other nail disorders: Secondary | ICD-10-CM

## 2017-04-02 NOTE — Progress Notes (Signed)
CC:  I have pain of my right knee. I would like an injection.  The patient has chronic pain of the right knee.  There is no recent trauma.  There is no redness.  Injections in the past have helped.  The knee has no redness, has an effusion and crepitus present.  ROM of the right knee is 0-100.  Impression:  Chronic knee pain right  Return: 2 months  PROCEDURE NOTE:  The patient requests injections of the right knee , verbal consent was obtained.  The right knee was prepped appropriately after time out was performed.   Sterile technique was observed and injection of 1 cc of Depo-Medrol 40 mg with several cc's of plain xylocaine. Anesthesia was provided by ethyl chloride and a 20-gauge needle was used to inject the knee area. The injection was tolerated well.  A band aid dressing was applied.  The patient was advised to apply ice later today and tomorrow to the injection sight as needed.  I pared his nails of his feet while he was here.  Electronically Signed Sanjuana Kava, MD 5/30/20183:14 PM

## 2017-04-04 DIAGNOSIS — N183 Chronic kidney disease, stage 3 (moderate): Secondary | ICD-10-CM | POA: Diagnosis not present

## 2017-04-04 DIAGNOSIS — K219 Gastro-esophageal reflux disease without esophagitis: Secondary | ICD-10-CM | POA: Diagnosis not present

## 2017-04-04 DIAGNOSIS — E785 Hyperlipidemia, unspecified: Secondary | ICD-10-CM | POA: Diagnosis not present

## 2017-04-04 DIAGNOSIS — R630 Anorexia: Secondary | ICD-10-CM | POA: Diagnosis not present

## 2017-04-04 DIAGNOSIS — I519 Heart disease, unspecified: Secondary | ICD-10-CM | POA: Diagnosis not present

## 2017-04-04 DIAGNOSIS — R531 Weakness: Secondary | ICD-10-CM | POA: Diagnosis not present

## 2017-04-04 DIAGNOSIS — I739 Peripheral vascular disease, unspecified: Secondary | ICD-10-CM | POA: Diagnosis not present

## 2017-04-04 DIAGNOSIS — J449 Chronic obstructive pulmonary disease, unspecified: Secondary | ICD-10-CM | POA: Diagnosis not present

## 2017-04-04 DIAGNOSIS — I10 Essential (primary) hypertension: Secondary | ICD-10-CM | POA: Diagnosis not present

## 2017-04-04 DIAGNOSIS — E46 Unspecified protein-calorie malnutrition: Secondary | ICD-10-CM | POA: Diagnosis not present

## 2017-04-08 DIAGNOSIS — E46 Unspecified protein-calorie malnutrition: Secondary | ICD-10-CM | POA: Diagnosis not present

## 2017-04-08 DIAGNOSIS — K219 Gastro-esophageal reflux disease without esophagitis: Secondary | ICD-10-CM | POA: Diagnosis not present

## 2017-04-08 DIAGNOSIS — I519 Heart disease, unspecified: Secondary | ICD-10-CM | POA: Diagnosis not present

## 2017-04-08 DIAGNOSIS — R630 Anorexia: Secondary | ICD-10-CM | POA: Diagnosis not present

## 2017-04-08 DIAGNOSIS — J449 Chronic obstructive pulmonary disease, unspecified: Secondary | ICD-10-CM | POA: Diagnosis not present

## 2017-04-08 DIAGNOSIS — R531 Weakness: Secondary | ICD-10-CM | POA: Diagnosis not present

## 2017-04-11 DIAGNOSIS — R630 Anorexia: Secondary | ICD-10-CM | POA: Diagnosis not present

## 2017-04-11 DIAGNOSIS — R531 Weakness: Secondary | ICD-10-CM | POA: Diagnosis not present

## 2017-04-11 DIAGNOSIS — J449 Chronic obstructive pulmonary disease, unspecified: Secondary | ICD-10-CM | POA: Diagnosis not present

## 2017-04-11 DIAGNOSIS — E46 Unspecified protein-calorie malnutrition: Secondary | ICD-10-CM | POA: Diagnosis not present

## 2017-04-11 DIAGNOSIS — I519 Heart disease, unspecified: Secondary | ICD-10-CM | POA: Diagnosis not present

## 2017-04-11 DIAGNOSIS — K219 Gastro-esophageal reflux disease without esophagitis: Secondary | ICD-10-CM | POA: Diagnosis not present

## 2017-04-14 DIAGNOSIS — I519 Heart disease, unspecified: Secondary | ICD-10-CM | POA: Diagnosis not present

## 2017-04-14 DIAGNOSIS — R630 Anorexia: Secondary | ICD-10-CM | POA: Diagnosis not present

## 2017-04-14 DIAGNOSIS — R531 Weakness: Secondary | ICD-10-CM | POA: Diagnosis not present

## 2017-04-14 DIAGNOSIS — J449 Chronic obstructive pulmonary disease, unspecified: Secondary | ICD-10-CM | POA: Diagnosis not present

## 2017-04-14 DIAGNOSIS — E46 Unspecified protein-calorie malnutrition: Secondary | ICD-10-CM | POA: Diagnosis not present

## 2017-04-14 DIAGNOSIS — K219 Gastro-esophageal reflux disease without esophagitis: Secondary | ICD-10-CM | POA: Diagnosis not present

## 2017-04-15 DIAGNOSIS — E46 Unspecified protein-calorie malnutrition: Secondary | ICD-10-CM | POA: Diagnosis not present

## 2017-04-15 DIAGNOSIS — J449 Chronic obstructive pulmonary disease, unspecified: Secondary | ICD-10-CM | POA: Diagnosis not present

## 2017-04-15 DIAGNOSIS — I519 Heart disease, unspecified: Secondary | ICD-10-CM | POA: Diagnosis not present

## 2017-04-15 DIAGNOSIS — R630 Anorexia: Secondary | ICD-10-CM | POA: Diagnosis not present

## 2017-04-15 DIAGNOSIS — R531 Weakness: Secondary | ICD-10-CM | POA: Diagnosis not present

## 2017-04-15 DIAGNOSIS — K219 Gastro-esophageal reflux disease without esophagitis: Secondary | ICD-10-CM | POA: Diagnosis not present

## 2017-04-18 DIAGNOSIS — R630 Anorexia: Secondary | ICD-10-CM | POA: Diagnosis not present

## 2017-04-18 DIAGNOSIS — K219 Gastro-esophageal reflux disease without esophagitis: Secondary | ICD-10-CM | POA: Diagnosis not present

## 2017-04-18 DIAGNOSIS — I519 Heart disease, unspecified: Secondary | ICD-10-CM | POA: Diagnosis not present

## 2017-04-18 DIAGNOSIS — J449 Chronic obstructive pulmonary disease, unspecified: Secondary | ICD-10-CM | POA: Diagnosis not present

## 2017-04-18 DIAGNOSIS — R531 Weakness: Secondary | ICD-10-CM | POA: Diagnosis not present

## 2017-04-18 DIAGNOSIS — E46 Unspecified protein-calorie malnutrition: Secondary | ICD-10-CM | POA: Diagnosis not present

## 2017-04-21 DIAGNOSIS — E46 Unspecified protein-calorie malnutrition: Secondary | ICD-10-CM | POA: Diagnosis not present

## 2017-04-21 DIAGNOSIS — J449 Chronic obstructive pulmonary disease, unspecified: Secondary | ICD-10-CM | POA: Diagnosis not present

## 2017-04-21 DIAGNOSIS — R630 Anorexia: Secondary | ICD-10-CM | POA: Diagnosis not present

## 2017-04-21 DIAGNOSIS — R531 Weakness: Secondary | ICD-10-CM | POA: Diagnosis not present

## 2017-04-21 DIAGNOSIS — I519 Heart disease, unspecified: Secondary | ICD-10-CM | POA: Diagnosis not present

## 2017-04-21 DIAGNOSIS — K219 Gastro-esophageal reflux disease without esophagitis: Secondary | ICD-10-CM | POA: Diagnosis not present

## 2017-04-23 ENCOUNTER — Ambulatory Visit: Payer: Medicare Other | Admitting: Orthopaedic Surgery

## 2017-04-24 DIAGNOSIS — E46 Unspecified protein-calorie malnutrition: Secondary | ICD-10-CM | POA: Diagnosis not present

## 2017-04-24 DIAGNOSIS — R531 Weakness: Secondary | ICD-10-CM | POA: Diagnosis not present

## 2017-04-24 DIAGNOSIS — R630 Anorexia: Secondary | ICD-10-CM | POA: Diagnosis not present

## 2017-04-24 DIAGNOSIS — K219 Gastro-esophageal reflux disease without esophagitis: Secondary | ICD-10-CM | POA: Diagnosis not present

## 2017-04-24 DIAGNOSIS — I519 Heart disease, unspecified: Secondary | ICD-10-CM | POA: Diagnosis not present

## 2017-04-24 DIAGNOSIS — J449 Chronic obstructive pulmonary disease, unspecified: Secondary | ICD-10-CM | POA: Diagnosis not present

## 2017-04-28 DIAGNOSIS — I519 Heart disease, unspecified: Secondary | ICD-10-CM | POA: Diagnosis not present

## 2017-04-28 DIAGNOSIS — K219 Gastro-esophageal reflux disease without esophagitis: Secondary | ICD-10-CM | POA: Diagnosis not present

## 2017-04-28 DIAGNOSIS — R630 Anorexia: Secondary | ICD-10-CM | POA: Diagnosis not present

## 2017-04-28 DIAGNOSIS — R531 Weakness: Secondary | ICD-10-CM | POA: Diagnosis not present

## 2017-04-28 DIAGNOSIS — J449 Chronic obstructive pulmonary disease, unspecified: Secondary | ICD-10-CM | POA: Diagnosis not present

## 2017-04-28 DIAGNOSIS — E46 Unspecified protein-calorie malnutrition: Secondary | ICD-10-CM | POA: Diagnosis not present

## 2017-04-29 DIAGNOSIS — J449 Chronic obstructive pulmonary disease, unspecified: Secondary | ICD-10-CM | POA: Diagnosis not present

## 2017-04-29 DIAGNOSIS — I519 Heart disease, unspecified: Secondary | ICD-10-CM | POA: Diagnosis not present

## 2017-04-29 DIAGNOSIS — K219 Gastro-esophageal reflux disease without esophagitis: Secondary | ICD-10-CM | POA: Diagnosis not present

## 2017-04-29 DIAGNOSIS — R531 Weakness: Secondary | ICD-10-CM | POA: Diagnosis not present

## 2017-04-29 DIAGNOSIS — E46 Unspecified protein-calorie malnutrition: Secondary | ICD-10-CM | POA: Diagnosis not present

## 2017-04-29 DIAGNOSIS — R630 Anorexia: Secondary | ICD-10-CM | POA: Diagnosis not present

## 2017-05-01 DIAGNOSIS — E46 Unspecified protein-calorie malnutrition: Secondary | ICD-10-CM | POA: Diagnosis not present

## 2017-05-01 DIAGNOSIS — R531 Weakness: Secondary | ICD-10-CM | POA: Diagnosis not present

## 2017-05-01 DIAGNOSIS — J449 Chronic obstructive pulmonary disease, unspecified: Secondary | ICD-10-CM | POA: Diagnosis not present

## 2017-05-01 DIAGNOSIS — I519 Heart disease, unspecified: Secondary | ICD-10-CM | POA: Diagnosis not present

## 2017-05-01 DIAGNOSIS — K219 Gastro-esophageal reflux disease without esophagitis: Secondary | ICD-10-CM | POA: Diagnosis not present

## 2017-05-01 DIAGNOSIS — R630 Anorexia: Secondary | ICD-10-CM | POA: Diagnosis not present

## 2017-05-04 DIAGNOSIS — J449 Chronic obstructive pulmonary disease, unspecified: Secondary | ICD-10-CM | POA: Diagnosis not present

## 2017-05-04 DIAGNOSIS — R531 Weakness: Secondary | ICD-10-CM | POA: Diagnosis not present

## 2017-05-04 DIAGNOSIS — K219 Gastro-esophageal reflux disease without esophagitis: Secondary | ICD-10-CM | POA: Diagnosis not present

## 2017-05-04 DIAGNOSIS — N183 Chronic kidney disease, stage 3 (moderate): Secondary | ICD-10-CM | POA: Diagnosis not present

## 2017-05-04 DIAGNOSIS — I519 Heart disease, unspecified: Secondary | ICD-10-CM | POA: Diagnosis not present

## 2017-05-04 DIAGNOSIS — I10 Essential (primary) hypertension: Secondary | ICD-10-CM | POA: Diagnosis not present

## 2017-05-04 DIAGNOSIS — E46 Unspecified protein-calorie malnutrition: Secondary | ICD-10-CM | POA: Diagnosis not present

## 2017-05-04 DIAGNOSIS — I739 Peripheral vascular disease, unspecified: Secondary | ICD-10-CM | POA: Diagnosis not present

## 2017-05-04 DIAGNOSIS — R630 Anorexia: Secondary | ICD-10-CM | POA: Diagnosis not present

## 2017-05-04 DIAGNOSIS — E785 Hyperlipidemia, unspecified: Secondary | ICD-10-CM | POA: Diagnosis not present

## 2017-05-06 DIAGNOSIS — R630 Anorexia: Secondary | ICD-10-CM | POA: Diagnosis not present

## 2017-05-06 DIAGNOSIS — J449 Chronic obstructive pulmonary disease, unspecified: Secondary | ICD-10-CM | POA: Diagnosis not present

## 2017-05-06 DIAGNOSIS — I519 Heart disease, unspecified: Secondary | ICD-10-CM | POA: Diagnosis not present

## 2017-05-06 DIAGNOSIS — K219 Gastro-esophageal reflux disease without esophagitis: Secondary | ICD-10-CM | POA: Diagnosis not present

## 2017-05-06 DIAGNOSIS — E46 Unspecified protein-calorie malnutrition: Secondary | ICD-10-CM | POA: Diagnosis not present

## 2017-05-06 DIAGNOSIS — R531 Weakness: Secondary | ICD-10-CM | POA: Diagnosis not present

## 2017-05-08 DIAGNOSIS — R531 Weakness: Secondary | ICD-10-CM | POA: Diagnosis not present

## 2017-05-08 DIAGNOSIS — R630 Anorexia: Secondary | ICD-10-CM | POA: Diagnosis not present

## 2017-05-08 DIAGNOSIS — J449 Chronic obstructive pulmonary disease, unspecified: Secondary | ICD-10-CM | POA: Diagnosis not present

## 2017-05-08 DIAGNOSIS — I519 Heart disease, unspecified: Secondary | ICD-10-CM | POA: Diagnosis not present

## 2017-05-08 DIAGNOSIS — E46 Unspecified protein-calorie malnutrition: Secondary | ICD-10-CM | POA: Diagnosis not present

## 2017-05-08 DIAGNOSIS — K219 Gastro-esophageal reflux disease without esophagitis: Secondary | ICD-10-CM | POA: Diagnosis not present

## 2017-05-12 DIAGNOSIS — J449 Chronic obstructive pulmonary disease, unspecified: Secondary | ICD-10-CM | POA: Diagnosis not present

## 2017-05-12 DIAGNOSIS — R630 Anorexia: Secondary | ICD-10-CM | POA: Diagnosis not present

## 2017-05-12 DIAGNOSIS — E46 Unspecified protein-calorie malnutrition: Secondary | ICD-10-CM | POA: Diagnosis not present

## 2017-05-12 DIAGNOSIS — K219 Gastro-esophageal reflux disease without esophagitis: Secondary | ICD-10-CM | POA: Diagnosis not present

## 2017-05-12 DIAGNOSIS — R531 Weakness: Secondary | ICD-10-CM | POA: Diagnosis not present

## 2017-05-12 DIAGNOSIS — I519 Heart disease, unspecified: Secondary | ICD-10-CM | POA: Diagnosis not present

## 2017-05-13 DIAGNOSIS — R630 Anorexia: Secondary | ICD-10-CM | POA: Diagnosis not present

## 2017-05-13 DIAGNOSIS — J449 Chronic obstructive pulmonary disease, unspecified: Secondary | ICD-10-CM | POA: Diagnosis not present

## 2017-05-13 DIAGNOSIS — I519 Heart disease, unspecified: Secondary | ICD-10-CM | POA: Diagnosis not present

## 2017-05-13 DIAGNOSIS — R531 Weakness: Secondary | ICD-10-CM | POA: Diagnosis not present

## 2017-05-13 DIAGNOSIS — E46 Unspecified protein-calorie malnutrition: Secondary | ICD-10-CM | POA: Diagnosis not present

## 2017-05-13 DIAGNOSIS — K219 Gastro-esophageal reflux disease without esophagitis: Secondary | ICD-10-CM | POA: Diagnosis not present

## 2017-05-16 DIAGNOSIS — K219 Gastro-esophageal reflux disease without esophagitis: Secondary | ICD-10-CM | POA: Diagnosis not present

## 2017-05-16 DIAGNOSIS — I519 Heart disease, unspecified: Secondary | ICD-10-CM | POA: Diagnosis not present

## 2017-05-16 DIAGNOSIS — J449 Chronic obstructive pulmonary disease, unspecified: Secondary | ICD-10-CM | POA: Diagnosis not present

## 2017-05-16 DIAGNOSIS — R531 Weakness: Secondary | ICD-10-CM | POA: Diagnosis not present

## 2017-05-16 DIAGNOSIS — E46 Unspecified protein-calorie malnutrition: Secondary | ICD-10-CM | POA: Diagnosis not present

## 2017-05-16 DIAGNOSIS — R630 Anorexia: Secondary | ICD-10-CM | POA: Diagnosis not present

## 2017-05-20 DIAGNOSIS — I519 Heart disease, unspecified: Secondary | ICD-10-CM | POA: Diagnosis not present

## 2017-05-20 DIAGNOSIS — E46 Unspecified protein-calorie malnutrition: Secondary | ICD-10-CM | POA: Diagnosis not present

## 2017-05-20 DIAGNOSIS — R531 Weakness: Secondary | ICD-10-CM | POA: Diagnosis not present

## 2017-05-20 DIAGNOSIS — R630 Anorexia: Secondary | ICD-10-CM | POA: Diagnosis not present

## 2017-05-20 DIAGNOSIS — K219 Gastro-esophageal reflux disease without esophagitis: Secondary | ICD-10-CM | POA: Diagnosis not present

## 2017-05-20 DIAGNOSIS — J449 Chronic obstructive pulmonary disease, unspecified: Secondary | ICD-10-CM | POA: Diagnosis not present

## 2017-05-23 DIAGNOSIS — E46 Unspecified protein-calorie malnutrition: Secondary | ICD-10-CM | POA: Diagnosis not present

## 2017-05-23 DIAGNOSIS — K219 Gastro-esophageal reflux disease without esophagitis: Secondary | ICD-10-CM | POA: Diagnosis not present

## 2017-05-23 DIAGNOSIS — R630 Anorexia: Secondary | ICD-10-CM | POA: Diagnosis not present

## 2017-05-23 DIAGNOSIS — J449 Chronic obstructive pulmonary disease, unspecified: Secondary | ICD-10-CM | POA: Diagnosis not present

## 2017-05-23 DIAGNOSIS — I519 Heart disease, unspecified: Secondary | ICD-10-CM | POA: Diagnosis not present

## 2017-05-23 DIAGNOSIS — R531 Weakness: Secondary | ICD-10-CM | POA: Diagnosis not present

## 2017-05-27 DIAGNOSIS — R531 Weakness: Secondary | ICD-10-CM | POA: Diagnosis not present

## 2017-05-27 DIAGNOSIS — R630 Anorexia: Secondary | ICD-10-CM | POA: Diagnosis not present

## 2017-05-27 DIAGNOSIS — I519 Heart disease, unspecified: Secondary | ICD-10-CM | POA: Diagnosis not present

## 2017-05-27 DIAGNOSIS — J449 Chronic obstructive pulmonary disease, unspecified: Secondary | ICD-10-CM | POA: Diagnosis not present

## 2017-05-27 DIAGNOSIS — E46 Unspecified protein-calorie malnutrition: Secondary | ICD-10-CM | POA: Diagnosis not present

## 2017-05-27 DIAGNOSIS — K219 Gastro-esophageal reflux disease without esophagitis: Secondary | ICD-10-CM | POA: Diagnosis not present

## 2017-06-03 ENCOUNTER — Ambulatory Visit: Payer: Medicare Other | Admitting: Orthopaedic Surgery

## 2017-06-03 DIAGNOSIS — R531 Weakness: Secondary | ICD-10-CM | POA: Diagnosis not present

## 2017-06-03 DIAGNOSIS — I519 Heart disease, unspecified: Secondary | ICD-10-CM | POA: Diagnosis not present

## 2017-06-03 DIAGNOSIS — E46 Unspecified protein-calorie malnutrition: Secondary | ICD-10-CM | POA: Diagnosis not present

## 2017-06-03 DIAGNOSIS — R630 Anorexia: Secondary | ICD-10-CM | POA: Diagnosis not present

## 2017-06-03 DIAGNOSIS — K219 Gastro-esophageal reflux disease without esophagitis: Secondary | ICD-10-CM | POA: Diagnosis not present

## 2017-06-03 DIAGNOSIS — J449 Chronic obstructive pulmonary disease, unspecified: Secondary | ICD-10-CM | POA: Diagnosis not present

## 2017-06-04 DIAGNOSIS — R630 Anorexia: Secondary | ICD-10-CM | POA: Diagnosis not present

## 2017-06-04 DIAGNOSIS — R531 Weakness: Secondary | ICD-10-CM | POA: Diagnosis not present

## 2017-06-04 DIAGNOSIS — K219 Gastro-esophageal reflux disease without esophagitis: Secondary | ICD-10-CM | POA: Diagnosis not present

## 2017-06-04 DIAGNOSIS — E46 Unspecified protein-calorie malnutrition: Secondary | ICD-10-CM | POA: Diagnosis not present

## 2017-06-04 DIAGNOSIS — I519 Heart disease, unspecified: Secondary | ICD-10-CM | POA: Diagnosis not present

## 2017-06-04 DIAGNOSIS — J449 Chronic obstructive pulmonary disease, unspecified: Secondary | ICD-10-CM | POA: Diagnosis not present

## 2017-06-04 DIAGNOSIS — N183 Chronic kidney disease, stage 3 (moderate): Secondary | ICD-10-CM | POA: Diagnosis not present

## 2017-06-04 DIAGNOSIS — E785 Hyperlipidemia, unspecified: Secondary | ICD-10-CM | POA: Diagnosis not present

## 2017-06-04 DIAGNOSIS — I10 Essential (primary) hypertension: Secondary | ICD-10-CM | POA: Diagnosis not present

## 2017-06-04 DIAGNOSIS — I739 Peripheral vascular disease, unspecified: Secondary | ICD-10-CM | POA: Diagnosis not present

## 2017-06-06 DIAGNOSIS — R531 Weakness: Secondary | ICD-10-CM | POA: Diagnosis not present

## 2017-06-06 DIAGNOSIS — I519 Heart disease, unspecified: Secondary | ICD-10-CM | POA: Diagnosis not present

## 2017-06-06 DIAGNOSIS — K219 Gastro-esophageal reflux disease without esophagitis: Secondary | ICD-10-CM | POA: Diagnosis not present

## 2017-06-06 DIAGNOSIS — E46 Unspecified protein-calorie malnutrition: Secondary | ICD-10-CM | POA: Diagnosis not present

## 2017-06-06 DIAGNOSIS — J449 Chronic obstructive pulmonary disease, unspecified: Secondary | ICD-10-CM | POA: Diagnosis not present

## 2017-06-06 DIAGNOSIS — R630 Anorexia: Secondary | ICD-10-CM | POA: Diagnosis not present

## 2017-06-09 DIAGNOSIS — E46 Unspecified protein-calorie malnutrition: Secondary | ICD-10-CM | POA: Diagnosis not present

## 2017-06-09 DIAGNOSIS — K219 Gastro-esophageal reflux disease without esophagitis: Secondary | ICD-10-CM | POA: Diagnosis not present

## 2017-06-09 DIAGNOSIS — I519 Heart disease, unspecified: Secondary | ICD-10-CM | POA: Diagnosis not present

## 2017-06-09 DIAGNOSIS — R630 Anorexia: Secondary | ICD-10-CM | POA: Diagnosis not present

## 2017-06-09 DIAGNOSIS — R531 Weakness: Secondary | ICD-10-CM | POA: Diagnosis not present

## 2017-06-09 DIAGNOSIS — J449 Chronic obstructive pulmonary disease, unspecified: Secondary | ICD-10-CM | POA: Diagnosis not present

## 2017-06-12 DIAGNOSIS — R531 Weakness: Secondary | ICD-10-CM | POA: Diagnosis not present

## 2017-06-12 DIAGNOSIS — J449 Chronic obstructive pulmonary disease, unspecified: Secondary | ICD-10-CM | POA: Diagnosis not present

## 2017-06-12 DIAGNOSIS — I519 Heart disease, unspecified: Secondary | ICD-10-CM | POA: Diagnosis not present

## 2017-06-12 DIAGNOSIS — K219 Gastro-esophageal reflux disease without esophagitis: Secondary | ICD-10-CM | POA: Diagnosis not present

## 2017-06-12 DIAGNOSIS — R630 Anorexia: Secondary | ICD-10-CM | POA: Diagnosis not present

## 2017-06-12 DIAGNOSIS — E46 Unspecified protein-calorie malnutrition: Secondary | ICD-10-CM | POA: Diagnosis not present

## 2017-06-17 DIAGNOSIS — I519 Heart disease, unspecified: Secondary | ICD-10-CM | POA: Diagnosis not present

## 2017-06-17 DIAGNOSIS — K219 Gastro-esophageal reflux disease without esophagitis: Secondary | ICD-10-CM | POA: Diagnosis not present

## 2017-06-17 DIAGNOSIS — R531 Weakness: Secondary | ICD-10-CM | POA: Diagnosis not present

## 2017-06-17 DIAGNOSIS — E46 Unspecified protein-calorie malnutrition: Secondary | ICD-10-CM | POA: Diagnosis not present

## 2017-06-17 DIAGNOSIS — R630 Anorexia: Secondary | ICD-10-CM | POA: Diagnosis not present

## 2017-06-17 DIAGNOSIS — J449 Chronic obstructive pulmonary disease, unspecified: Secondary | ICD-10-CM | POA: Diagnosis not present

## 2017-06-19 DIAGNOSIS — K219 Gastro-esophageal reflux disease without esophagitis: Secondary | ICD-10-CM | POA: Diagnosis not present

## 2017-06-19 DIAGNOSIS — R531 Weakness: Secondary | ICD-10-CM | POA: Diagnosis not present

## 2017-06-19 DIAGNOSIS — R630 Anorexia: Secondary | ICD-10-CM | POA: Diagnosis not present

## 2017-06-19 DIAGNOSIS — J449 Chronic obstructive pulmonary disease, unspecified: Secondary | ICD-10-CM | POA: Diagnosis not present

## 2017-06-19 DIAGNOSIS — E46 Unspecified protein-calorie malnutrition: Secondary | ICD-10-CM | POA: Diagnosis not present

## 2017-06-19 DIAGNOSIS — I519 Heart disease, unspecified: Secondary | ICD-10-CM | POA: Diagnosis not present

## 2017-06-20 DIAGNOSIS — R630 Anorexia: Secondary | ICD-10-CM | POA: Diagnosis not present

## 2017-06-20 DIAGNOSIS — K219 Gastro-esophageal reflux disease without esophagitis: Secondary | ICD-10-CM | POA: Diagnosis not present

## 2017-06-20 DIAGNOSIS — R531 Weakness: Secondary | ICD-10-CM | POA: Diagnosis not present

## 2017-06-20 DIAGNOSIS — I519 Heart disease, unspecified: Secondary | ICD-10-CM | POA: Diagnosis not present

## 2017-06-20 DIAGNOSIS — J449 Chronic obstructive pulmonary disease, unspecified: Secondary | ICD-10-CM | POA: Diagnosis not present

## 2017-06-20 DIAGNOSIS — E46 Unspecified protein-calorie malnutrition: Secondary | ICD-10-CM | POA: Diagnosis not present

## 2017-06-21 DIAGNOSIS — R531 Weakness: Secondary | ICD-10-CM | POA: Diagnosis not present

## 2017-06-21 DIAGNOSIS — E46 Unspecified protein-calorie malnutrition: Secondary | ICD-10-CM | POA: Diagnosis not present

## 2017-06-21 DIAGNOSIS — K219 Gastro-esophageal reflux disease without esophagitis: Secondary | ICD-10-CM | POA: Diagnosis not present

## 2017-06-21 DIAGNOSIS — R630 Anorexia: Secondary | ICD-10-CM | POA: Diagnosis not present

## 2017-06-21 DIAGNOSIS — J449 Chronic obstructive pulmonary disease, unspecified: Secondary | ICD-10-CM | POA: Diagnosis not present

## 2017-06-21 DIAGNOSIS — I519 Heart disease, unspecified: Secondary | ICD-10-CM | POA: Diagnosis not present

## 2017-06-22 DIAGNOSIS — R531 Weakness: Secondary | ICD-10-CM | POA: Diagnosis not present

## 2017-06-22 DIAGNOSIS — E46 Unspecified protein-calorie malnutrition: Secondary | ICD-10-CM | POA: Diagnosis not present

## 2017-06-22 DIAGNOSIS — R630 Anorexia: Secondary | ICD-10-CM | POA: Diagnosis not present

## 2017-06-22 DIAGNOSIS — J449 Chronic obstructive pulmonary disease, unspecified: Secondary | ICD-10-CM | POA: Diagnosis not present

## 2017-06-22 DIAGNOSIS — K219 Gastro-esophageal reflux disease without esophagitis: Secondary | ICD-10-CM | POA: Diagnosis not present

## 2017-06-22 DIAGNOSIS — I519 Heart disease, unspecified: Secondary | ICD-10-CM | POA: Diagnosis not present

## 2017-06-23 DIAGNOSIS — R630 Anorexia: Secondary | ICD-10-CM | POA: Diagnosis not present

## 2017-06-23 DIAGNOSIS — R531 Weakness: Secondary | ICD-10-CM | POA: Diagnosis not present

## 2017-06-23 DIAGNOSIS — J449 Chronic obstructive pulmonary disease, unspecified: Secondary | ICD-10-CM | POA: Diagnosis not present

## 2017-06-23 DIAGNOSIS — E46 Unspecified protein-calorie malnutrition: Secondary | ICD-10-CM | POA: Diagnosis not present

## 2017-06-23 DIAGNOSIS — I519 Heart disease, unspecified: Secondary | ICD-10-CM | POA: Diagnosis not present

## 2017-06-23 DIAGNOSIS — K219 Gastro-esophageal reflux disease without esophagitis: Secondary | ICD-10-CM | POA: Diagnosis not present

## 2017-07-05 DEATH — deceased

## 2018-12-30 IMAGING — CR DG CHEST 1V PORT
1 series · 1 of 1 positions shown · non-contrast
Comparison: April 10, 2013

CLINICAL DATA: Fever.  Recent fall

EXAM:
PORTABLE CHEST 1 VIEW

[portable]
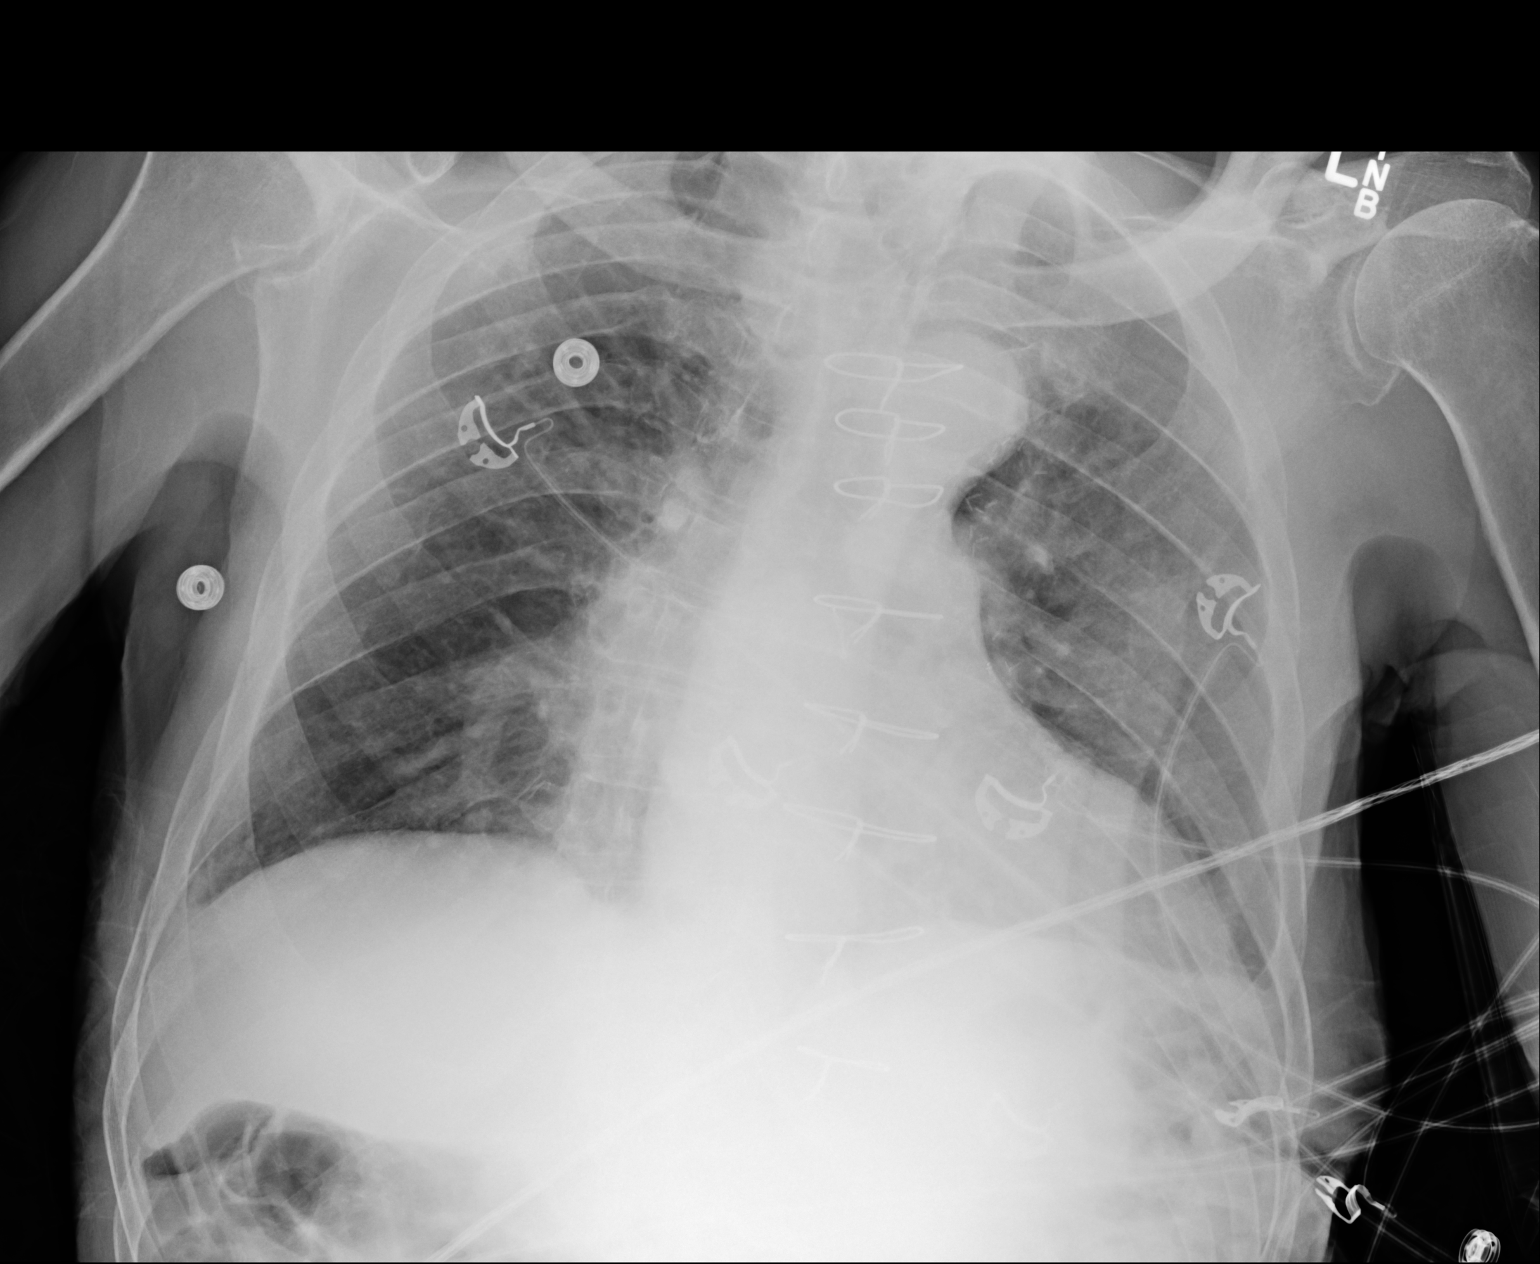

[1 of 1 positions shown; findings below may reference images not displayed]

FINDINGS: There is no edema or consolidation. Heart is upper normal in size
with pulmonary vascularity within normal limits. No adenopathy.
Patient is status post internal mammary bypass grafting. No
pneumothorax. No bone lesions.
IMPRESSION: No edema or consolidation.  No evident pneumothorax.

## 2018-12-30 IMAGING — CR DG FEMUR 2+V*R*
4 series · 4 of 4 positions shown · non-contrast
Comparison: None.

CLINICAL DATA: Right lower extremity pain status post fall.

EXAM:
RIGHT FEMUR 2 VIEWS

[ap (1 of 2)]
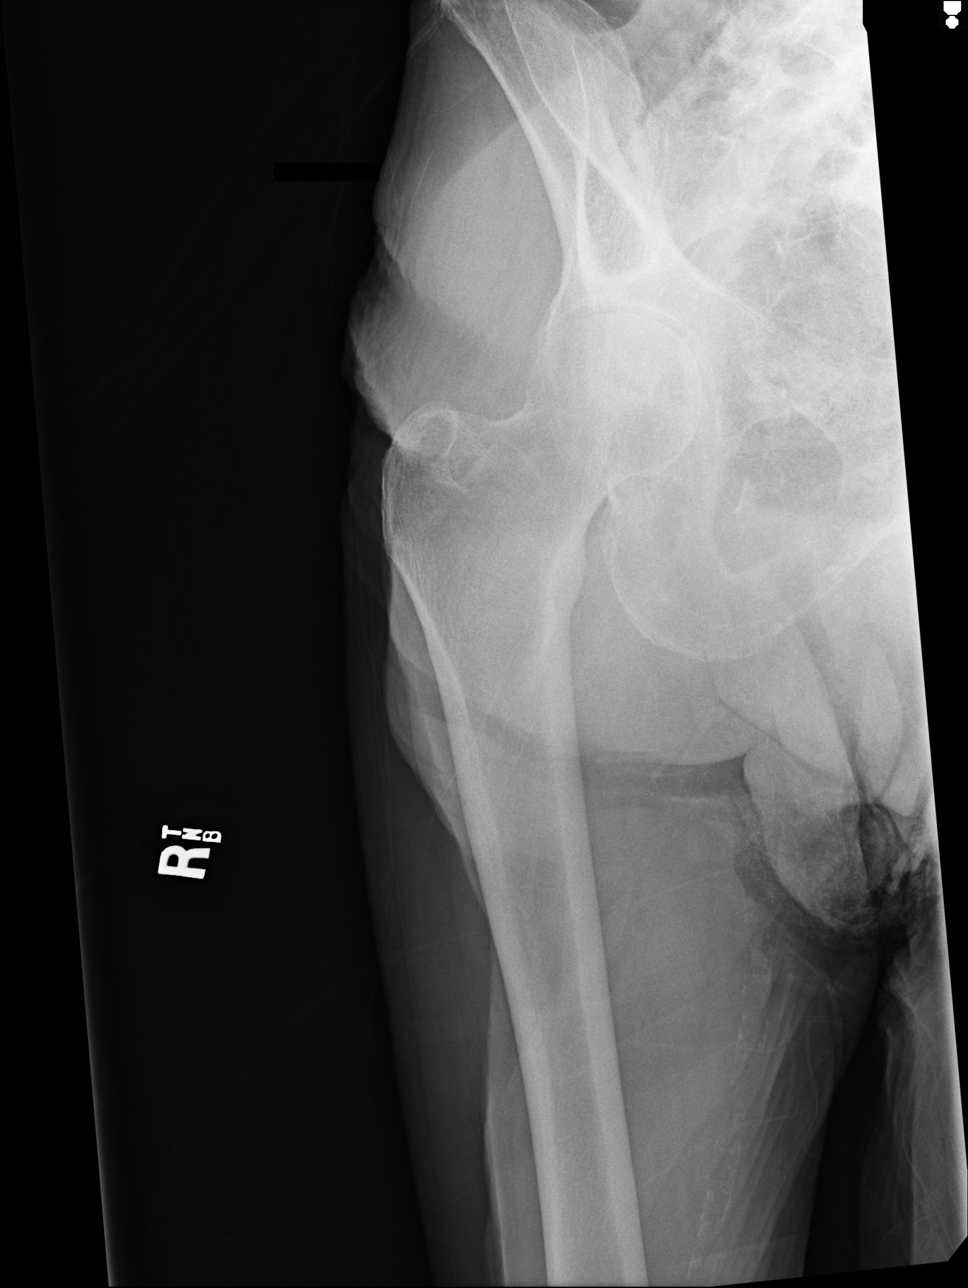

[ap (2 of 2)]
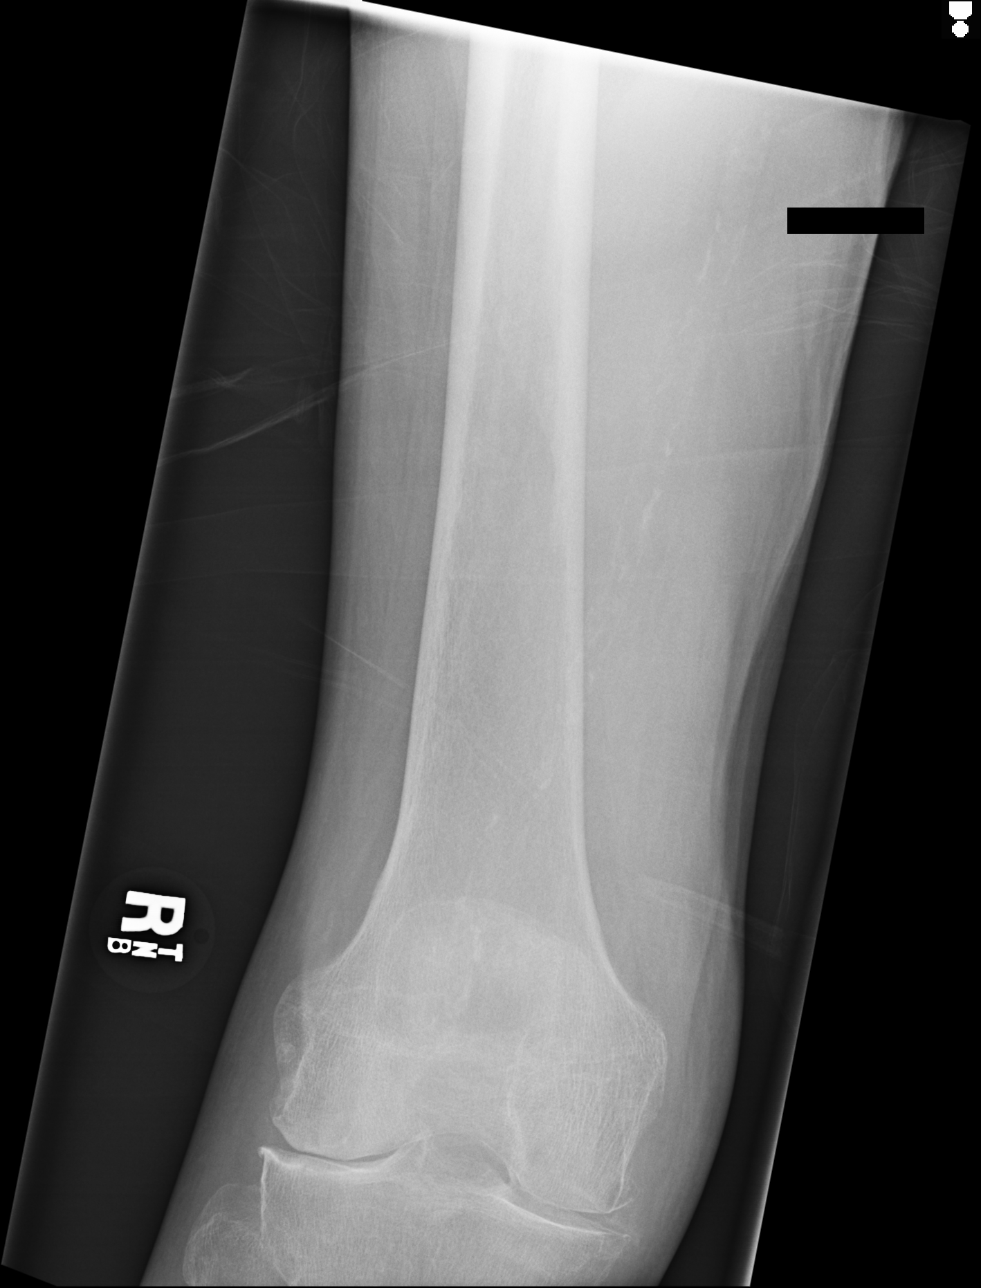

[lat (1 of 2)]
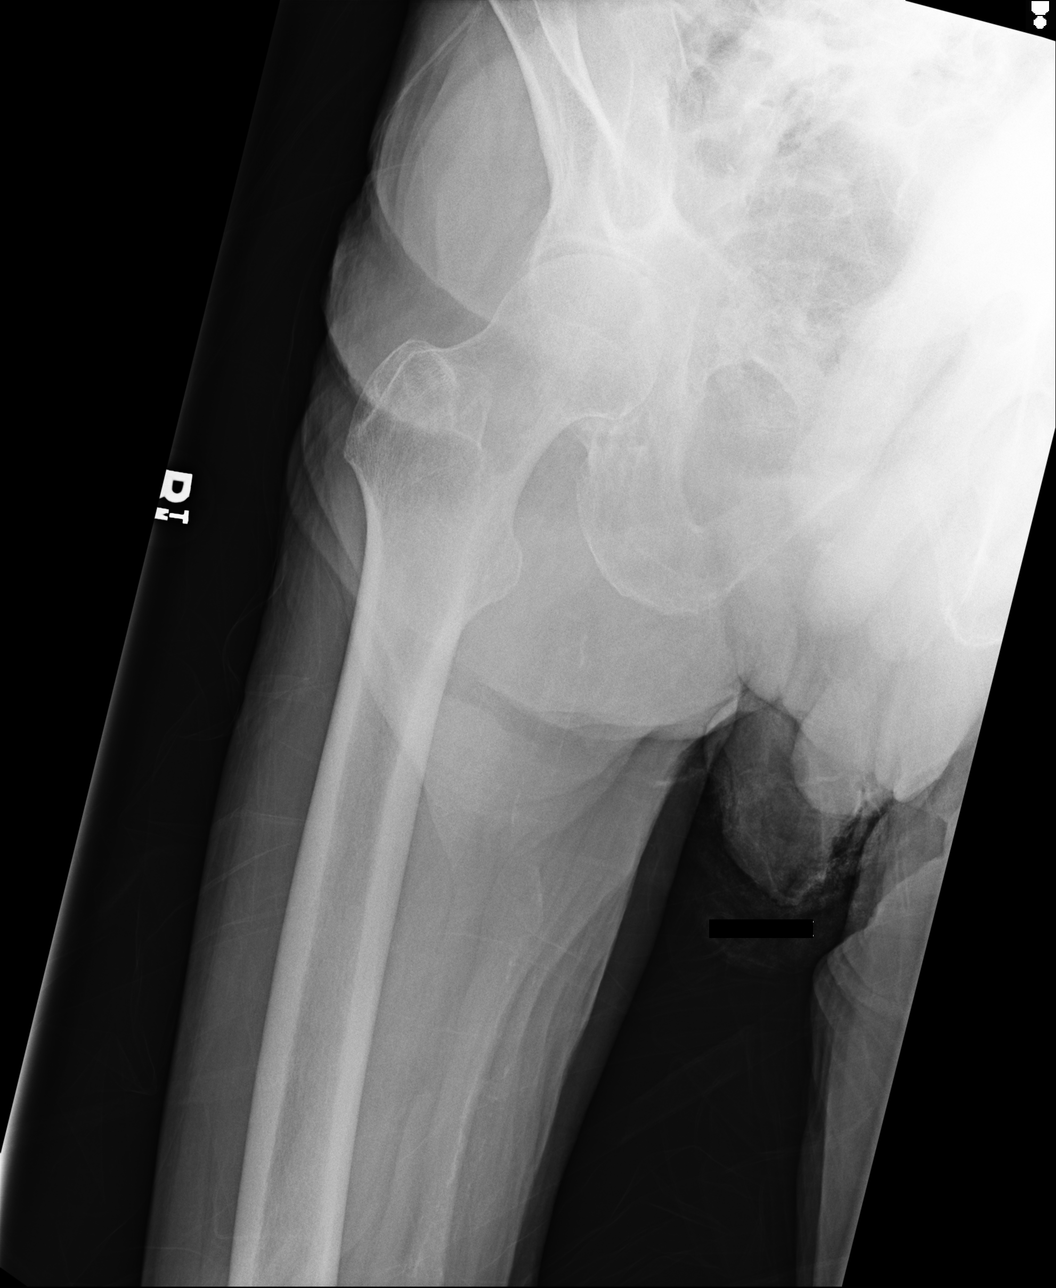

[lat (2 of 2)]
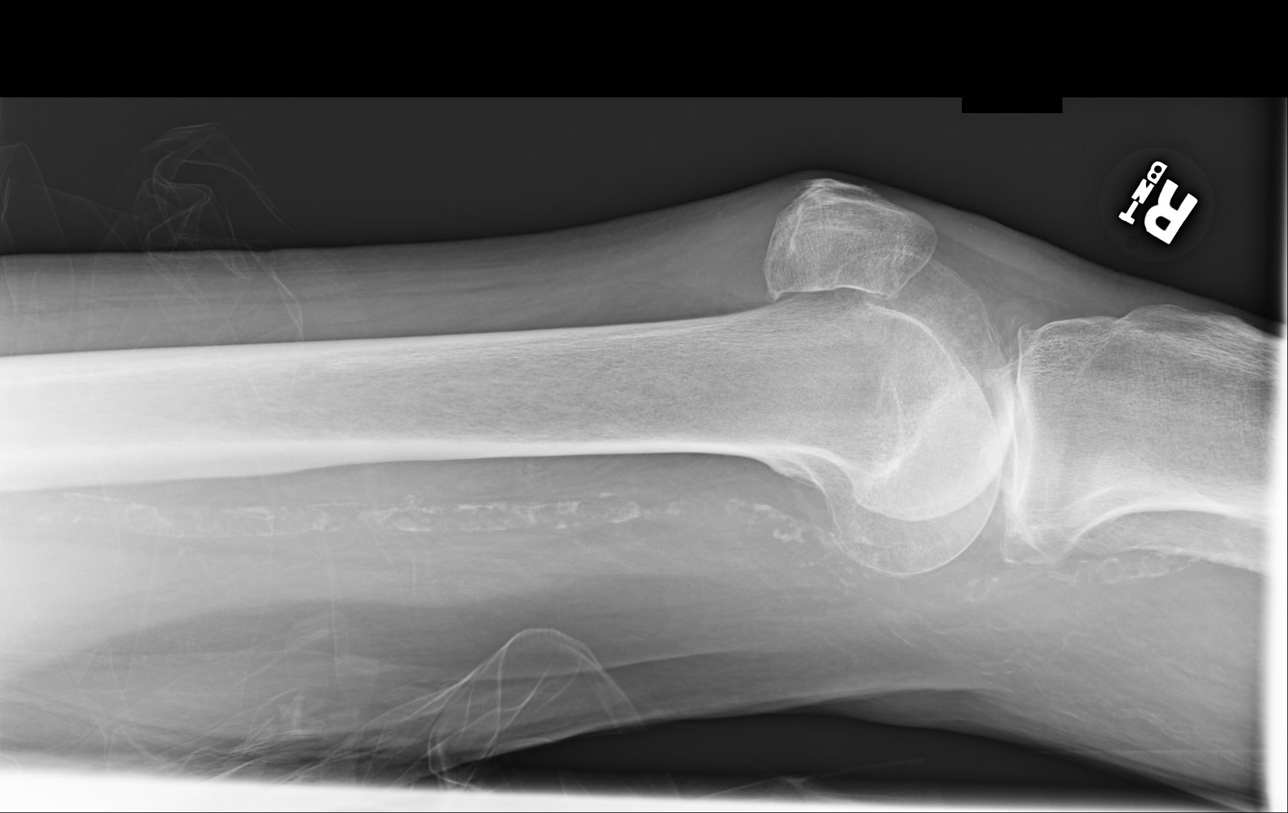

[4 of 4 positions shown; findings below may reference images not displayed]

FINDINGS: There is no evidence of fracture or other focal bone lesions. There
are 3 compartment osteoarthritic changes of the right knee, with a
small right suprapatellar joint effusion. Moderate osteoarthritic
changes of the right hip joint also noted. Vascular calcifications
within the soft tissues.
IMPRESSION: No acute fracture or dislocation identified about the right femur.

Osteoarthritic changes of the right hip right knee.
